# Patient Record
Sex: Male | Born: 1975 | Race: White | Hispanic: No | Marital: Single | Smoking: Never smoker
Health system: Southern US, Community
[De-identification: ages and names within clinical notes are randomized; demographics above are authoritative.]

## PROBLEM LIST (undated history)

## (undated) DIAGNOSIS — F79 Unspecified intellectual disabilities: Secondary | ICD-10-CM

## (undated) DIAGNOSIS — I341 Nonrheumatic mitral (valve) prolapse: Secondary | ICD-10-CM

## (undated) DIAGNOSIS — R569 Unspecified convulsions: Secondary | ICD-10-CM

## (undated) DIAGNOSIS — R109 Unspecified abdominal pain: Secondary | ICD-10-CM

## (undated) DIAGNOSIS — K219 Gastro-esophageal reflux disease without esophagitis: Secondary | ICD-10-CM

## (undated) DIAGNOSIS — D649 Anemia, unspecified: Secondary | ICD-10-CM

## (undated) DIAGNOSIS — F39 Unspecified mood [affective] disorder: Secondary | ICD-10-CM

## (undated) DIAGNOSIS — D696 Thrombocytopenia, unspecified: Secondary | ICD-10-CM

## (undated) HISTORY — PX: CHOLECYSTECTOMY: SHX55

---

## 2004-07-15 ENCOUNTER — Emergency Department (HOSPITAL_COMMUNITY): Admission: EM | Admit: 2004-07-15 | Discharge: 2004-07-15 | Payer: Self-pay | Admitting: Emergency Medicine

## 2008-04-01 ENCOUNTER — Emergency Department (HOSPITAL_COMMUNITY): Admission: EM | Admit: 2008-04-01 | Discharge: 2008-04-01 | Payer: Self-pay | Admitting: Emergency Medicine

## 2010-06-23 ENCOUNTER — Encounter (INDEPENDENT_AMBULATORY_CARE_PROVIDER_SITE_OTHER): Payer: Self-pay | Admitting: *Deleted

## 2010-07-13 ENCOUNTER — Ambulatory Visit: Payer: Self-pay | Admitting: Gastroenterology

## 2010-07-13 DIAGNOSIS — R112 Nausea with vomiting, unspecified: Secondary | ICD-10-CM

## 2010-07-13 DIAGNOSIS — R1013 Epigastric pain: Secondary | ICD-10-CM | POA: Insufficient documentation

## 2010-07-13 DIAGNOSIS — D649 Anemia, unspecified: Secondary | ICD-10-CM

## 2010-07-13 DIAGNOSIS — R634 Abnormal weight loss: Secondary | ICD-10-CM

## 2010-07-19 ENCOUNTER — Encounter: Payer: Self-pay | Admitting: Internal Medicine

## 2010-08-22 ENCOUNTER — Encounter (INDEPENDENT_AMBULATORY_CARE_PROVIDER_SITE_OTHER): Payer: Self-pay

## 2010-08-31 ENCOUNTER — Ambulatory Visit: Payer: Self-pay | Admitting: Internal Medicine

## 2010-12-06 NOTE — Letter (Signed)
Summary: Plan of Care, Need to Discuss  [facility]  [address]   [place], [place] [address]   Phone: [phone]  Fax: [phone]    August 22, 2010  Cole Stewart [address] [place], [place]  [address] [birth year]   Dear Cole Stewart,   We are writing this letter to inform you of treatment plans and/or discuss your plan of care.  We have tried several times to contact you; however, we have yet to reach you. We would like for you to complete the labs if not already done, and also do the stool test and get it back to [place] as soon as possible. Please call our office @ [phone] and let us know when you plan to get these done. It is very important that you do so.  Please do not neglect your health.   Sincerely,    Cole Spring LPN  [facility] Ph: [phone]    Fax: [phone]

## 2010-12-06 NOTE — Assessment & Plan Note (Signed)
Summary: ABD PAIN/SS   Visit Type:  Consult Referring Cole Stewart:  [facility] Primary Care Cole Stewart:  [facility]  Chief Complaint:  epigastric pain.  History of Present Illness: Cole Stewart is a pleasant, mentally challenged WM, who presents for further evaluation of abd pain. Patient is unable to provide any significant history given his mental disabilities. He is accompanied by caregiver/transporter. We were made aware that patient has had w/u in 2010 by Dr. [facility]. I contacted Cole Garret, FNP who referred him to [place]. She stated she intended for GI referral but did not specify [facility] for second opinion. I discussed with Dr. Jena Stewart, who saw patient back in 2002, and he determined we would offer that patient continue his care here (especially since Dr. Karilyn Stewart is not seeing any more patient in [place] and is in process of moving practice).   He presents with c/o abdominal pain. Doesn't eat lunch. C/O poor appetite. Per caregiver, he has had persistent weight loss. Otherwise, history unobtainable. No reported issues with bowels. In 4/10, weighed 155lbs, 8/11 140lbs, today 135lbs.   Labs 06/02/10: glu 91, Cre 0.65, Tbili 0.4, AP 74, AST 15, ALT 11, alb 3.7, lipase 22, amylase 30, WBC 6, H/H 10.3/32.8, MCV 67.8, plt 156,000.   CXR 06/02/10: no active cardiopulmonary disease  Records from GI associates: Last seen 4/10. EGD 4/11 was normal. B12/folate levels normal. Hgb 9.1. Ferritin normal. TIBC and sat low.   Current Medications (verified): 1)  Stavzor 500 Mg Cpdr (Valproic Acid) .... 2 Qam, 3 At Bedtime 2)  Gabapentin 400 Mg Caps (Gabapentin) .... 3 Two Times A Day 4 At Bedtime 3)  Ferrous Sulfate 325 (65 Fe) Mg Tabs (Ferrous Sulfate) .... Once Daily 4)  Carbamazepine 200 Mg Tabs (Carbamazepine) .... 2 Three Times A Day 5)  Risperdal 0.5 Mg Tabs (Risperidone) .... At Bedtime 6)  Nexium 40 Mg Cpdr (Esomeprazole Magnesium) .... Once Daily 7)  Docusate Sodium 100 Mg  Caps (Docusate Sodium) .... Two Times A Day 8)  Fanapt 10 Mg Tabs (Iloperidone) .... 2 At Bedtime 9)  Levocarnitine 330 Mg Tabs (Levocarnitine) .... Two Times A Day 10)  Vitamin D 50000units .... Q Week  Allergies (verified): No Known Drug Allergies  Past History:  Past Medical History: Mental Retardation Thalassemia??? Chronic anemia H/O seizure d/o Thryombocytopenia GERD  Past Surgical History: Cholecystectomy  Family History: Unavailable.  Social History: Mental retardation. Nonsmoker. Mother living, doesn't go home for visits.   Review of Systems       Unavailable  Vital Signs:  Patient profile:   35 year old male Weight:      135 pounds Temp:     97.9 degrees F oral Pulse rate:   80 / minute BP sitting:   120 / 88  (left arm) Cuff size:   regular  Vitals Entered By: Cole Limes Cole Stewart (July 13, 2010 11:05 AM)  Physical Exam  General:  Thin WM, NAD. Unable to provided history. Head:  Normocephalic and atraumatic. Eyes:  sclera nonicteric. Mouth:  op moist Neck:  Supple; no masses or thyromegaly. Lungs:  Clear throughout to auscultation. Heart:  Regular rate and rhythm; no murmurs, rubs,  or bruits. Abdomen:  Soft. Upper abd tenderness, mild. No HSM or masses. No abd bruit or hernia. No rebound or guarding.  Extremities:  No clubbing, cyanosis, edema or deformities noted. Neurologic:  Alert and  oriented x4;  grossly normal neurologically. Skin:  Intact without significant lesions or rashes. Cervical Nodes:  No significant cervical adenopathy. Psych:  Alert and cooperative. Normal mood and affect.  Impression & Recommendations:  Problem # 1:  EPIGASTRIC PAIN (ICD-789.06)  Reported epigastric pain, h/o intermittent n/v, anorexia, weight loss. Similar symptoms in 2010 for which he was seen by Dr. Karilyn Stewart and underwent w/u. He had normal EGD. According to their records, his symptoms resolved and he gained weight. He has not been seen at [facility]  since 4/10.   He presents now with recurrent abd pain, anorexia, documented 20 pound weight loss. He has anemia, chronic. Per PCP records, h/o Thalassemia? details unavailable. He reportedly had cholecytectomy before as well.   At this point, would recheck anemia panel, celiac screen, ifobt. If ifobt positive or evidence of IDA, then would offer TCS/EGD. Otherwise, would offer EGD only. If EGD neg, then CT A/P to complete w/u.   Orders: Consultation Level III (47829) I would like to thank [facility] for allowing [place] to take part in the care of this nice patient.   Appended Document: Orders Update PLEASE ARRANGE FOR PATIENT TO HAVE LABS DONE. ALSO NEED IFOBT DONE TOO.   Clinical Lists Changes  Orders: Added new Test order of T-Ferritin [phone]) - Signed Added new Test order of T-Iron Binding Capacity (TIBC) ([id number]) - Signed Added new Test order of T-Reticulocyte Count, Automated ([id number]) - Signed Added new Test order of T-Iron [phone]) - Signed Added new Test order of T-igA ([id number]) - Signed Added new Test order of T-Tissue Transglutamase Ab IgA ([id number]) - Signed      Appended Document: ABD PAIN/SS LMOM to call. (lab order and iFOBT at front.Marland KitchenMarland KitchenMarland KitchenMarland Kitchenplease ask nurse to explain directions of the iFOBT when you come to pick up.)  Appended Document: ABD PAIN/SS LM with Adelina Mings at the home, one of the caregivers. She will inform her boss when he returns.  Appended Document: ABD PAIN/SS Informed [employer] manager, Pascal Lux. He will try to come by Wednesday to pick up.  Appended Document: ABD PAIN/SS Please find out if he had labs, ifobt done. If not, he needs to do them. Thanks.  Appended Document: ABD PAIN/SS tried to call [facility], NA  Appended Document: ABD PAIN/SS Called, many rings and no answer. Mailing letter to call.

## 2010-12-06 NOTE — Letter (Signed)
Summary: [facility]ICAL RECORDS FROM [facility]  MEDICAL RECORDS FROM [facility]   Imported By: Rexene Alberts 07/19/2010 09:04:37  _____________________________________________________________________  External Attachment:    Type:   Image     Comment:   External Document

## 2010-12-06 NOTE — Letter (Signed)
Summary: MEDICAL RECORDS FROM [facility]  MEDICAL RECORDS FROM [facility]   Imported By: Rexene Alberts 07/19/2010 09:01:37  _____________________________________________________________________  External Attachment:    Type:   Image     Comment:   External Document

## 2010-12-06 NOTE — Assessment & Plan Note (Signed)
Summary: DROPPED OFF STOOL/SS   Pt returned one iFOBT and it was negative.     Allergies: No Known Drug Allergies  Appended Document: Orders Update    Clinical Lists Changes  Orders: Added new Service order of Immuno-chemical Fecal Occult ([id number]) - Signed

## 2010-12-06 NOTE — Letter (Signed)
Summary: Unable to Reach, Consult Scheduled  [facility]  [address]   [place], [place] [address]   Phone: [phone]  Fax: [phone]    06/23/2010  Cole Stewart [address] [place], [place]  [address] [birth year]   Dear Cole Stewart,   We have been unable to reach you by phone.  Please contact our office with an updated phone number.  At the recommendation of [facility]  we have been asked to schedule you a consult with DR FIELDS for ABDOMINAL PAIN.   Please call our office at [phone].     Thank you,    Diana Eves  [facility] R. Roetta Sessions, M.D.    Jonette Eva, M.D. Lorenza Burton, FNP-BC    Tana Coast, PA-C Phone: [phone]    Fax: [phone]

## 2011-08-02 LAB — DIFFERENTIAL
Basophils Relative: 1
Eosinophils Absolute: 0
Eosinophils Relative: 0
Lymphocytes Relative: 42
Monocytes Relative: 5
Neutrophils Relative %: 52

## 2011-08-02 LAB — BASIC METABOLIC PANEL
Chloride: 102
GFR calc Af Amer: >60
GFR calc non Af Amer: >60
Potassium: 3.5
Sodium: 138

## 2011-08-02 LAB — CBC
HCT: 36.5 — ABNORMAL LOW
Hemoglobin: 11.4 — ABNORMAL LOW
Platelets: 215
RBC: 5.51
WBC: 9.4

## 2011-08-02 LAB — RAPID URINE DRUG SCREEN, HOSP PERFORMED: Tetrahydrocannabinol: NOT DETECTED

## 2011-08-02 LAB — ETHANOL: Alcohol, Ethyl (B): <5

## 2013-03-25 ENCOUNTER — Emergency Department (HOSPITAL_COMMUNITY): Payer: Medicare Other

## 2013-03-25 ENCOUNTER — Observation Stay (HOSPITAL_COMMUNITY)
Admission: EM | Admit: 2013-03-25 | Discharge: 2013-03-26 | Disposition: A | Discharge status: Home / Self Care | Payer: Medicare Other | Attending: Internal Medicine | Admitting: Internal Medicine

## 2013-03-25 ENCOUNTER — Encounter (HOSPITAL_COMMUNITY): Payer: Self-pay | Admitting: *Deleted

## 2013-03-25 DIAGNOSIS — R1115 Cyclical vomiting syndrome unrelated to migraine: Secondary | ICD-10-CM | POA: Insufficient documentation

## 2013-03-25 DIAGNOSIS — D509 Iron deficiency anemia, unspecified: Secondary | ICD-10-CM | POA: Diagnosis present

## 2013-03-25 DIAGNOSIS — R112 Nausea with vomiting, unspecified: Secondary | ICD-10-CM

## 2013-03-25 DIAGNOSIS — D696 Thrombocytopenia, unspecified: Secondary | ICD-10-CM

## 2013-03-25 DIAGNOSIS — D649 Anemia, unspecified: Principal | ICD-10-CM

## 2013-03-25 DIAGNOSIS — R270 Ataxia, unspecified: Secondary | ICD-10-CM

## 2013-03-25 DIAGNOSIS — R279 Unspecified lack of coordination: Secondary | ICD-10-CM

## 2013-03-25 DIAGNOSIS — K5900 Constipation, unspecified: Secondary | ICD-10-CM

## 2013-03-25 DIAGNOSIS — R109 Unspecified abdominal pain: Secondary | ICD-10-CM

## 2013-03-25 DIAGNOSIS — R5383 Other fatigue: Secondary | ICD-10-CM | POA: Insufficient documentation

## 2013-03-25 DIAGNOSIS — R531 Weakness: Secondary | ICD-10-CM | POA: Diagnosis present

## 2013-03-25 DIAGNOSIS — R5381 Other malaise: Secondary | ICD-10-CM | POA: Insufficient documentation

## 2013-03-25 HISTORY — DX: Unspecified intellectual disabilities: F79

## 2013-03-25 HISTORY — DX: Anemia, unspecified: D64.9

## 2013-03-25 HISTORY — DX: Unspecified mood (affective) disorder: F39

## 2013-03-25 HISTORY — DX: Nonrheumatic mitral (valve) prolapse: I34.1

## 2013-03-25 HISTORY — DX: Gastro-esophageal reflux disease without esophagitis: K21.9

## 2013-03-25 HISTORY — DX: Unspecified convulsions: R56.9

## 2013-03-25 HISTORY — DX: Unspecified abdominal pain: R10.9

## 2013-03-25 HISTORY — DX: Thrombocytopenia, unspecified: D69.6

## 2013-03-25 LAB — COMPREHENSIVE METABOLIC PANEL
Alkaline Phosphatase: 76 U/L (ref 39–117)
BUN: 13 mg/dL (ref 6–23)
CO2: 29 mEq/L (ref 19–32)
Chloride: 98 mEq/L (ref 96–112)
GFR calc Af Amer: >90 mL/min (ref >90)
Glucose, Bld: 87 mg/dL (ref 70–99)
Potassium: 3.9 mEq/L (ref 3.5–5.1)
Total Bilirubin: 0.4 mg/dL (ref 0.3–1.2)

## 2013-03-25 LAB — CBC WITH DIFFERENTIAL/PLATELET
Basophils Relative: 0 % (ref 0–1)
Eosinophils Relative: 1 % (ref 0–5)
Hemoglobin: 11.3 g/dL — ABNORMAL LOW (ref 13.0–17.0)
Lymphs Abs: 3.4 10*3/uL (ref 0.7–4.0)
MCH: 20.8 pg — ABNORMAL LOW (ref 26.0–34.0)
MCV: 70.1 fL — ABNORMAL LOW (ref 78.0–100.0)
Monocytes Absolute: 0.5 10*3/uL (ref 0.1–1.0)
RBC: 5.42 MIL/uL (ref 4.22–5.81)

## 2013-03-25 LAB — URINALYSIS, ROUTINE W REFLEX MICROSCOPIC
Glucose, UA: NEGATIVE mg/dL
Leukocytes, UA: NEGATIVE
Protein, ur: NEGATIVE mg/dL
Specific Gravity, Urine: 1.025 (ref 1.005–1.030)
Urobilinogen, UA: 0.2 mg/dL (ref 0.0–1.0)

## 2013-03-25 LAB — RETICULOCYTES: Retic Count, Absolute: 183.9 10*3/uL (ref 19.0–186.0)

## 2013-03-25 LAB — LIPASE, BLOOD: Lipase: 20 U/L (ref 11–59)

## 2013-03-25 MED ORDER — ONDANSETRON HCL 4 MG/2ML IJ SOLN
4.0000 mg | INTRAMUSCULAR | Status: DC | PRN
  Administered 2013-03-25: 4 mg via INTRAVENOUS
  Filled 2013-03-25: qty 2

## 2013-03-25 MED ORDER — FAMOTIDINE IN NACL 20-0.9 MG/50ML-% IV SOLN
20.0000 mg | Freq: Once | INTRAVENOUS | Status: AC
  Administered 2013-03-25: 20 mg via INTRAVENOUS
  Filled 2013-03-25: qty 50

## 2013-03-25 MED ORDER — IOHEXOL 300 MG/ML  SOLN
100.0000 mL | Freq: Once | INTRAMUSCULAR | Status: AC | PRN
  Administered 2013-03-25: 100 mL via INTRAVENOUS

## 2013-03-25 MED ORDER — IOHEXOL 300 MG/ML  SOLN
50.0000 mL | Freq: Once | INTRAMUSCULAR | Status: AC | PRN
  Administered 2013-03-25: 50 mL via ORAL

## 2013-03-25 MED ORDER — SODIUM CHLORIDE 0.9 % IV SOLN
INTRAVENOUS | Status: DC
  Administered 2013-03-25: 20:00:00 via INTRAVENOUS

## 2013-03-25 NOTE — ED Provider Notes (Signed)
History     CSN: [id number]  Arrival date & time 03/25/13  1830   First MD Initiated Contact with Patient 03/25/13 1906      Chief Complaint  Patient presents with  . Abdominal Pain     Patient is a 37 y.o. male presenting with abdominal pain. The history is provided by a caregiver. The history is limited by the condition of the patient (Hx MR, speech impairment).  Abdominal Pain   Pt was seen at 1925.  Per pt's caregiver, pt c/o generalized abd "pain" and N/V that began this morning.  Pt's caregiver states he appears "off balance" and "very weak" when he was walking today. Denies focal motor weakness. Denies fevers, no diarrhea, no black or blood in stools or emesis. Denies CP/SOB, no cough.     Past Medical History  Diagnosis Date  . Mental retardation   . Seizures   . Mood disorder   . MVP (mitral valve prolapse)   . Recurrent abdominal pain   . Anemia   . Thrombocytopenia   . GERD (gastroesophageal reflux disease)     Past Surgical History  Procedure Laterality Date  . Cholecystectomy       History  Substance Use Topics  . Smoking status: Never Smoker   . Smokeless tobacco: Not on file  . Alcohol Use: No      Review of Systems  Unable to perform ROS: Patient nonverbal    Allergies  Review of patient's allergies indicates no known allergies.  Home Medications   Current Outpatient Rx  Name  Route  Sig  Dispense  Refill  . carbamazepine (TEGRETOL) 200 MG tablet   Oral   Take 400 mg by mouth 3 (three) times daily.         . chlorhexidine (PERIDEX) 0.12 % solution   Mouth/Throat   Use as directed 15 mLs in the mouth or throat 2 (two) times daily. *Swish/Spit*         . clonazePAM (KLONOPIN) 0.5 MG tablet   Oral   Take 0.5 mg by mouth 3 (three) times daily.         Marland Kitchen docusate sodium (COLACE) 100 MG capsule   Oral   Take 100 mg by mouth 2 (two) times daily.         Marland Kitchen doxepin (SINEQUAN) 10 MG capsule   Oral   Take 10 mg by mouth at  bedtime.         . gabapentin (NEURONTIN) 400 MG capsule   Oral   Take 1,200-1,600 mg by mouth 3 (three) times daily. Take 3 capsules twice daily at 8am and 12pm, then take 4 capsules at bedtime         . Iloperidone (FANAPT) 6 MG TABS   Oral   Take 2 tablets by mouth daily with supper.         . iron polysaccharides (NIFEREX) 150 MG capsule   Oral   Take 150 mg by mouth daily.         Marland Kitchen ketoconazole (NIZORAL) 2 % shampoo   Topical   Apply 1 application topically 2 (two) times a week. *Lather, leave for 5 minutes, then rinse*         . levOCARNitine (CARNITOR) 330 MG tablet   Oral   Take 330 mg by mouth 2 (two) times daily.         Marland Kitchen lurasidone (LATUDA) 40 MG TABS   Oral   Take 40 mg by mouth every  evening. *with evening meal*         . omeprazole (PRILOSEC) 40 MG capsule   Oral   Take 40 mg by mouth daily.         . Valproic Acid (STAVZOR) 250 MG CPDR   Oral   Take 1,000-1,500 mg by mouth 2 (two) times daily. Take 4 capsules in the morning and 6 capsules in the evening         . Vitamin D, Ergocalciferol, (DRISDOL) 50000 UNITS CAPS   Oral   Take 50,000 Units by mouth every 30 (thirty) days.         . vitamin E 200 UNIT capsule   Oral   Take 200 Units by mouth every morning.         . zolpidem (AMBIEN) 10 MG tablet   Oral   Take 10 mg by mouth at bedtime.           BP 139/81  Pulse 86  Temp(Src) 98.7 F (37.1 C) (Rectal)  Resp 16  Ht 5\' 6"  (1.676 m)  Wt 170 lb 6 oz (77.282 kg)  BMI 27.51 kg/m2  SpO2 100%  Physical Exam 1930: Physical examination:  Nursing notes reviewed; Vital signs and O2 SAT reviewed;  Constitutional: Well developed, Well nourished, In no acute distress; Head:  Normocephalic, atraumatic; Eyes: EOMI, PERRL, No scleral icterus; ENMT: Mouth and pharynx normal, Mucous membranes dry; Neck: Supple, Full range of motion, No lymphadenopathy; Cardiovascular: Regular rate and rhythm, No gallop; Respiratory: Breath sounds  clear & equal bilaterally, No rales, rhonchi, wheezes.  Speaking full sentences with ease, Normal respiratory effort/excursion; Chest: Nontender, Movement normal; Abdomen: Soft, +mild diffuse tenderness to palp. Nondistended, Normal bowel sounds; Genitourinary: No CVA tenderness; Extremities: Pulses normal, No tenderness, No edema, No calf edema or asymmetry.; Neuro: Awake, alert, speech impairment per baseline. No facial droop. Moves all ext spontaneously without apparent gross focal motor deficits.; Skin: Color normal, Warm, Dry.   ED Course  Procedures    MDM  MDM Reviewed: previous chart, nursing note and vitals Interpretation: labs, CT scan and x-ray   Results for orders placed during the hospital encounter of 03/25/13  URINALYSIS, ROUTINE W REFLEX MICROSCOPIC      Result Value Range   Color, Urine YELLOW  YELLOW   APPearance CLEAR  CLEAR   Specific Gravity, Urine 1.025  1.005 - 1.030   pH 6.0  5.0 - 8.0   Glucose, UA NEGATIVE  NEGATIVE mg/dL   Hgb urine dipstick NEGATIVE  NEGATIVE   Bilirubin Urine NEGATIVE  NEGATIVE   Ketones, ur NEGATIVE  NEGATIVE mg/dL   Protein, ur NEGATIVE  NEGATIVE mg/dL   Urobilinogen, UA 0.2  0.0 - 1.0 mg/dL   Nitrite NEGATIVE  NEGATIVE   Leukocytes, UA NEGATIVE  NEGATIVE  CBC WITH DIFFERENTIAL      Result Value Range   WBC 6.7  4.0 - 10.5 K/uL   RBC 5.42  4.22 - 5.81 MIL/uL   Hemoglobin 11.3 (*) 13.0 - 17.0 g/dL   HCT 16.1 (*) 09.6 - 04.5 %   MCV 70.1 (*) 78.0 - 100.0 fL   MCH 20.8 (*) 26.0 - 34.0 pg   MCHC 29.7 (*) 30.0 - 36.0 g/dL   RDW 40.9 (*) 81.1 - 91.4 %   Platelets 127 (*) 150 - 400 K/uL   Neutrophils Relative % 41 (*) 43 - 77 %   Lymphocytes Relative 50 (*) 12 - 46 %   Monocytes Relative 8  3 -  12 %   Eosinophils Relative 1  0 - 5 %   Basophils Relative 0  0 - 1 %   Neutro Abs 2.7  1.7 - 7.7 K/uL   Lymphs Abs 3.4  0.7 - 4.0 K/uL   Monocytes Absolute 0.5  0.1 - 1.0 K/uL   Eosinophils Absolute 0.1  0.0 - 0.7 K/uL   Basophils  Absolute 0.0  0.0 - 0.1 K/uL   RBC Morphology POLYCHROMASIA PRESENT     WBC Morphology ATYPICAL LYMPHOCYTES     Smear Review LARGE PLATELETS PRESENT    COMPREHENSIVE METABOLIC PANEL      Result Value Range   Sodium 135  135 - 145 mEq/L   Potassium 3.9  3.5 - 5.1 mEq/L   Chloride 98  96 - 112 mEq/L   CO2 29  19 - 32 mEq/L   Glucose, Bld 87  70 - 99 mg/dL   BUN 13  6 - 23 mg/dL   Creatinine, Ser 1.61  0.50 - 1.35 mg/dL   Calcium 8.7  8.4 - 09.6 mg/dL   Total Protein 7.0  6.0 - 8.3 g/dL   Albumin 3.4 (*) 3.5 - 5.2 g/dL   AST 27  0 - 37 U/L   ALT 40  0 - 53 U/L   Alkaline Phosphatase 76  39 - 117 U/L   Total Bilirubin 0.4  0.3 - 1.2 mg/dL   GFR calc non Af Amer >90  >90 mL/min   GFR calc Af Amer >90  >90 mL/min  LIPASE, BLOOD      Result Value Range   Lipase 20  11 - 59 U/L   Dg Chest 1 View 03/25/2013   *RADIOLOGY REPORT*  Clinical Data: Abdominal pain and chest pain.  CHEST - 1 VIEW  Comparison: None.  Findings: The heart is mildly enlarged.  There is central vascular congestion and probable mild interstitial edema.  No definite pleural effusions or focal infiltrates. Low lung volumes with vascular crowding and bibasilar atelectasis.  The bony thorax is intact.  IMPRESSION:  1.  Cardiac enlargement with vascular congestion and possible mild edema. 2.  Low lung volumes with vascular crowding and bibasilar atelectasis.   Original Report Authenticated By: Rudie Meyer, M.D.   Ct Abdomen Pelvis W Contrast 03/25/2013   *RADIOLOGY REPORT*  Clinical Data: Abdominal pain.  CT ABDOMEN AND PELVIS WITH CONTRAST  Technique:  Multidetector CT imaging of the abdomen and pelvis was performed following the standard protocol during bolus administration of intravenous contrast.  Contrast: 50mL OMNIPAQUE IOHEXOL 300 MG/ML  SOLN, OMNIPAQUE IOHEXOL 300 MG/ML  SOLN  Comparison: CT scan 03/20/2008.  Findings: The lung bases are clear.  No pleural effusion.  The liver is unremarkable.  No focal hepatic  lesions or intrahepatic biliary dilatation.  The gallbladder is surgically absent.  No common bile duct dilatation.  The pancreas is normal. The spleen is normal.  The adrenal glands and kidneys are normal.  The stomach, duodenum, small bowel and colon are unremarkable except for a moderate-to-large amount of stool throughout the colon and down into the rectum suggesting constipation.  No mesenteric or retroperitoneal masses or adenopathy.  The aorta is normal in caliber.  The major branch vessels are patent.  The appendix is normal.  The bladder, prostate gland and seminal vesicles are unremarkable. No pelvic mass, adenopathy or free pelvic fluid collections.  No inguinal mass or hernia.  The bony structures are intact.  IMPRESSION: No acute abdominal/pelvic findings, mass lesions  or adenopathy. Moderate to large amount of stool throughout the colon suggesting constipation.   Original Report Authenticated By: Rudie Meyer, M.D.   Results for BRANDON, WIECHMAN (MRN [id number]) as of 03/25/2013 21:49  Ref. Range 04/01/2008 18:00 03/25/2013 20:05  Hemoglobin Latest Range: 13.0-17.0 g/dL 40.9 (L) 81.1 (L)  HCT Latest Range: 39.0-52.0 % 36.5 (L) 38.0 (L)     2145:  Pt unable to stand for orthostatic VS.  Pt unable to walk due to generalized weakness; no apparent focal deficits. Pt with several episodes of N/V while in the ED despite IV zofran.  No stooling while in the ED.  H/H per baseline.  Dx and testing d/w pt and caregiver.  Questions answered.  Verb understanding, agreeable to observation admit.  T/C to [facility] Dr. Orvan Falconer, case discussed, including:  HPI, pertinent PM/SHx, VS/PE, dx testing, ED course and treatment:  Agreeable to observation admit, requests to write temporary orders, obtain medical bed to team 2.         Laray Anger, DO 03/27/13 1909

## 2013-03-25 NOTE — H&P (Signed)
[facility] History and Physical  Cole Stewart  ZHY:[id number]  DOB: 1976/03/20   DOA: 03/25/2013   PCP:   Kirstie Peri, MD   Chief Complaint:  A difficulty walking since today  HPI: Cole Stewart is an 37 y.o. male.   Mentally challenged young Caucasian gentleman lives in a group home in [place] is brought in because of an ataxic gait today. He takes multiple psychotropic medications, and there is a history of vomiting which started today. There is no history of fever or chills, and because of patient's And mental retardation a full review of systems is not available  Rewiew of Systems:    Past Medical History  Diagnosis Date  . Mental retardation   . Seizures   . Mood disorder   . MVP (mitral valve prolapse)   . Recurrent abdominal pain   . Anemia   . Thrombocytopenia   . GERD (gastroesophageal reflux disease)     Past Surgical History  Procedure Laterality Date  . Cholecystectomy      Medications:  HOME MEDS: Prior to Admission medications   Medication Sig Start Date End Date Taking? Authorizing Provider  carbamazepine (TEGRETOL) 200 MG tablet Take 400 mg by mouth 3 (three) times daily.   Yes Historical Provider, MD  chlorhexidine (PERIDEX) 0.12 % solution Use as directed 15 mLs in the mouth or throat 2 (two) times daily. *Swish/Spit*   Yes Historical Provider, MD  clonazePAM (KLONOPIN) 0.5 MG tablet Take 0.5 mg by mouth 3 (three) times daily.   Yes Historical Provider, MD  docusate sodium (COLACE) 100 MG capsule Take 100 mg by mouth 2 (two) times daily.   Yes Historical Provider, MD  doxepin (SINEQUAN) 10 MG capsule Take 10 mg by mouth at bedtime.   Yes Historical Provider, MD  gabapentin (NEURONTIN) 400 MG capsule Take 1,200-1,600 mg by mouth 3 (three) times daily. Take 3 capsules twice daily at 8am and 12pm, then take 4 capsules at bedtime   Yes Historical Provider, MD  Iloperidone (FANAPT) 6 MG TABS Take 2 tablets by mouth daily with supper.   Yes  Historical Provider, MD  iron polysaccharides (NIFEREX) 150 MG capsule Take 150 mg by mouth daily.   Yes Historical Provider, MD  ketoconazole (NIZORAL) 2 % shampoo Apply 1 application topically 2 (two) times a week. *Lather, leave for 5 minutes, then rinse*   Yes Historical Provider, MD  levOCARNitine (CARNITOR) 330 MG tablet Take 330 mg by mouth 2 (two) times daily.   Yes Historical Provider, MD  lurasidone (LATUDA) 40 MG TABS Take 40 mg by mouth every evening. *with evening meal*   Yes Historical Provider, MD  omeprazole (PRILOSEC) 40 MG capsule Take 40 mg by mouth daily.   Yes Historical Provider, MD  Valproic Acid (STAVZOR) 250 MG CPDR Take 1,000-1,500 mg by mouth 2 (two) times daily. Take 4 capsules in the morning and 6 capsules in the evening   Yes Historical Provider, MD  Vitamin D, Ergocalciferol, (DRISDOL) 50000 UNITS CAPS Take 50,000 Units by mouth every 30 (thirty) days.   Yes Historical Provider, MD  vitamin E 200 UNIT capsule Take 200 Units by mouth every morning.   Yes Historical Provider, MD  zolpidem (AMBIEN) 10 MG tablet Take 10 mg by mouth at bedtime.   Yes Historical Provider, MD     Allergies:  Allergies no known allergies  Social History:   reports that he has never smoked. He does not have any smokeless tobacco history on file. He  reports that he does not drink alcohol or use illicit drugs.  Family History: History reviewed. No pertinent family history. Unable to attain  Physical Exam: Filed Vitals:   03/25/13 1939 03/25/13 1940 03/25/13 2012 03/25/13 2121  BP: 144/96 139/81  138/79  Pulse: 82 86  71  Temp:   98.7 F (37.1 C)   TempSrc:   Rectal   Resp:    18  Height:      Weight:      SpO2:    99%   Blood pressure 138/79, pulse 71, temperature 98.7 F (37.1 C), temperature source Rectal, resp. rate 18, height 5\' 6"  (1.676 m), weight 77.282 kg (170 lb 6 oz), SpO2 99.00%.  GEN:  Pleasant young Caucasian gentleman lying bed in no acute distress; unable to  be cooperative with exam PSYCH:  alert ;  neither anxious or depressed; affect is appropriate. HEENT: Mucous membranes pink, dry and anicteric; PERRLA, vertical nystagmus noted; does not cooperate with eye movement; no cervical lymphadenopathy nor thyromegaly or carotid bruit; no JVD; Breasts:: Not examined CHEST WALL: No tenderness CHEST: Normal respiration, clear to auscultation bilaterally HEART: Regular rate and rhythm; no murmurs rubs or gallops BACK: ; no CVA tenderness ABDOMEN: Obese, soft non-tender; no masses, no organomegaly, normal abdominal bowel sounds; no pannus; no intertriginous candida. Rectal Exam: Not done EXTREMITIES: N; no edema; no ulcerations. Genitalia: not examined PULSES: 2+ and symmetric SKIN: Normal hydration no rash or ulceration CNS: The patient does not fully cooperate with the exam; he has vertical nystagmus; unclear if this is new or old ;there is a faint suggestion of right facial weakness; he has fairly good grip strength in both upper extremities; he moves the left lower extremity on command, but seems to have more difficulty with her right lower extremity; but again his cooperation is not maximal. Gait was not tested   Labs on Admission:  Basic Metabolic Panel:  Recent Labs Lab 03/25/13 2005  NA 135  K 3.9  CL 98  CO2 29  GLUCOSE 87  BUN 13  CREATININE 0.57  CALCIUM 8.7   Liver Function Tests:  Recent Labs Lab 03/25/13 2005  AST 27  ALT 40  ALKPHOS 76  BILITOT 0.4  PROT 7.0  ALBUMIN 3.4*    Recent Labs Lab 03/25/13 2005  LIPASE 20   No results found for this basename: AMMONIA,  in the last 168 hours CBC:  Recent Labs Lab 03/25/13 2005  WBC 6.7  NEUTROABS 2.7  HGB 11.3*  HCT 38.0*  MCV 70.1*  PLT 127*   Cardiac Enzymes: No results found for this basename: CKTOTAL, CKMB, CKMBINDEX, TROPONINI,  in the last 168 hours BNP: No components found with this basename: POCBNP,  D-dimer: No components found with this basename:  D-DIMER,  CBG: No results found for this basename: GLUCAP,  in the last 168 hours  Radiological Exams on Admission: Dg Chest 1 View  03/25/2013   *RADIOLOGY REPORT*  Clinical Data: Abdominal pain and chest pain.  CHEST - 1 VIEW  Comparison: None.  Findings: The heart is mildly enlarged.  There is central vascular congestion and probable mild interstitial edema.  No definite pleural effusions or focal infiltrates. Low lung volumes with vascular crowding and bibasilar atelectasis.  The bony thorax is intact.  IMPRESSION:  1.  Cardiac enlargement with vascular congestion and possible mild edema. 2.  Low lung volumes with vascular crowding and bibasilar atelectasis.   Original Report Authenticated By: Rudie Meyer, M.D.  Ct Abdomen Pelvis W Contrast  03/25/2013   *RADIOLOGY REPORT*  Clinical Data: Abdominal pain.  CT ABDOMEN AND PELVIS WITH CONTRAST  Technique:  Multidetector CT imaging of the abdomen and pelvis was performed following the standard protocol during bolus administration of intravenous contrast.  Contrast: 50mL OMNIPAQUE IOHEXOL 300 MG/ML  SOLN, OMNIPAQUE IOHEXOL 300 MG/ML  SOLN  Comparison: CT scan 03/20/2008.  Findings: The lung bases are clear.  No pleural effusion.  The liver is unremarkable.  No focal hepatic lesions or intrahepatic biliary dilatation.  The gallbladder is surgically absent.  No common bile duct dilatation.  The pancreas is normal. The spleen is normal.  The adrenal glands and kidneys are normal.  The stomach, duodenum, small bowel and colon are unremarkable except for a moderate-to-large amount of stool throughout the colon and down into the rectum suggesting constipation.  No mesenteric or retroperitoneal masses or adenopathy.  The aorta is normal in caliber.  The major branch vessels are patent.  The appendix is normal.  The bladder, prostate gland and seminal vesicles are unremarkable. No pelvic mass, adenopathy or free pelvic fluid collections.  No inguinal mass or  hernia.  The bony structures are intact.  IMPRESSION: No acute abdominal/pelvic findings, mass lesions or adenopathy. Moderate to large amount of stool throughout the colon suggesting constipation.   Original Report Authenticated By: Rudie Meyer, M.D.       Assessment/Plan  Active Problems:   ANEMIA   Intractable nausea and vomiting   Generalized weakness   Constipation   Ataxia   Thrombocytopenia, unspecified   PLAN: Mentally retarded gentleman brought in for ataxic gait and noted to be vomiting, and did not very clearly defined neurological abnormalities. Also noted to have a microcytic anemia  Differential includes weakness due to dehydration, toxic effects of his multiple psychotropic medications in the setting of dehydration, cerebellar or brainstem ischemia.  Will admit him to a MedSurg bed for hydration; check his valproic acid level; temporarily hold his Neurontin which he is  Receiving at very high doses; temporarily hold antipsychotic medication; get CT scan of the brain, and consult neurology service for assistance with management.  We note that he is taking iron tablets, but we'll get an anemia panel nevertheless.  Other plans as per orders.  Code Status: Full Family Communication: No family members or caregivers present for discussion Disposition Plan: Likely discharge back to group home when stable    Raydan Schlabach Nocturnist [facility] Pager [phone]   03/25/2013, 10:14 PM

## 2013-03-25 NOTE — ED Notes (Signed)
Pt is unable to walk on his own at this time; caretaker states that this is not normal for him, at home he walks without assistance

## 2013-03-25 NOTE — ED Notes (Signed)
abd pain, n/d Pt is nonverbal.  "balance is off" per care giver.  Pt is from a group home.

## 2013-03-26 ENCOUNTER — Observation Stay (HOSPITAL_COMMUNITY): Payer: Medicare Other

## 2013-03-26 ENCOUNTER — Encounter (HOSPITAL_COMMUNITY): Payer: Self-pay

## 2013-03-26 DIAGNOSIS — R270 Ataxia, unspecified: Secondary | ICD-10-CM | POA: Diagnosis present

## 2013-03-26 DIAGNOSIS — K5900 Constipation, unspecified: Secondary | ICD-10-CM | POA: Diagnosis present

## 2013-03-26 DIAGNOSIS — D696 Thrombocytopenia, unspecified: Secondary | ICD-10-CM | POA: Diagnosis present

## 2013-03-26 DIAGNOSIS — R109 Unspecified abdominal pain: Secondary | ICD-10-CM

## 2013-03-26 DIAGNOSIS — R531 Weakness: Secondary | ICD-10-CM | POA: Diagnosis present

## 2013-03-26 DIAGNOSIS — R112 Nausea with vomiting, unspecified: Secondary | ICD-10-CM | POA: Diagnosis present

## 2013-03-26 DIAGNOSIS — R5383 Other fatigue: Secondary | ICD-10-CM

## 2013-03-26 LAB — CBC
HCT: 37.9 % — ABNORMAL LOW (ref 39.0–52.0)
MCHC: 29 g/dL — ABNORMAL LOW (ref 30.0–36.0)
Platelets: 125 10*3/uL — ABNORMAL LOW (ref 150–400)
RDW: 20.3 % — ABNORMAL HIGH (ref 11.5–15.5)

## 2013-03-26 LAB — COMPREHENSIVE METABOLIC PANEL
AST: 25 U/L (ref 0–37)
Albumin: 3 g/dL — ABNORMAL LOW (ref 3.5–5.2)
Alkaline Phosphatase: 70 U/L (ref 39–117)
BUN: 7 mg/dL (ref 6–23)
Potassium: 4.1 mEq/L (ref 3.5–5.1)
Sodium: 137 mEq/L (ref 135–145)
Total Protein: 6.2 g/dL (ref 6.0–8.3)

## 2013-03-26 LAB — FOLATE: Folate: >20 ng/mL

## 2013-03-26 LAB — TSH: TSH: 7.026 u[IU]/mL — ABNORMAL HIGH (ref 0.350–4.500)

## 2013-03-26 LAB — MRSA PCR SCREENING: MRSA by PCR: NEGATIVE

## 2013-03-26 LAB — HEMOGLOBIN A1C
Hgb A1c MFr Bld: 4.7 % (ref <5.7)
Mean Plasma Glucose: 88 mg/dL (ref <117)

## 2013-03-26 LAB — IRON AND TIBC: Iron: 116 ug/dL (ref 42–135)

## 2013-03-26 LAB — VALPROIC ACID LEVEL: Valproic Acid Lvl: 60 ug/mL (ref 50.0–100.0)

## 2013-03-26 MED ORDER — PANTOPRAZOLE SODIUM 40 MG PO TBEC
80.0000 mg | DELAYED_RELEASE_TABLET | Freq: Every day | ORAL | Status: DC
  Administered 2013-03-26: 80 mg via ORAL
  Filled 2013-03-26: qty 2

## 2013-03-26 MED ORDER — FLEET ENEMA 7-19 GM/118ML RE ENEM: 1.0000 | ENEMA | Freq: Every day | RECTAL | Status: DC | PRN

## 2013-03-26 MED ORDER — TRAZODONE HCL 50 MG PO TABS: 25.0000 mg | ORAL_TABLET | Freq: Every evening | ORAL | Status: DC | PRN

## 2013-03-26 MED ORDER — ACETAMINOPHEN 325 MG PO TABS
650.0000 mg | ORAL_TABLET | Freq: Four times a day (QID) | ORAL | Status: DC | PRN
  Administered 2013-03-26: 650 mg via ORAL
  Filled 2013-03-26: qty 2

## 2013-03-26 MED ORDER — VALPROIC ACID 250 MG PO CAPS
1000.0000 mg | ORAL_CAPSULE | ORAL | Status: DC
  Administered 2013-03-26: 1000 mg via ORAL
  Filled 2013-03-26 (×3): qty 4

## 2013-03-26 MED ORDER — POLYETHYLENE GLYCOL 3350 17 G PO PACK: 17.0000 g | PACK | Freq: Every day | ORAL | Status: DC

## 2013-03-26 MED ORDER — ONDANSETRON HCL 4 MG PO TABS: 4.0000 mg | ORAL_TABLET | Freq: Three times a day (TID) | ORAL | Status: DC | PRN

## 2013-03-26 MED ORDER — ONDANSETRON HCL 4 MG/2ML IJ SOLN
4.0000 mg | INTRAMUSCULAR | Status: DC | PRN
  Administered 2013-03-26: 4 mg via INTRAVENOUS
  Filled 2013-03-26: qty 2

## 2013-03-26 MED ORDER — POTASSIUM CHLORIDE IN NACL 20-0.9 MEQ/L-% IV SOLN
INTRAVENOUS | Status: DC
  Administered 2013-03-26: 02:00:00 via INTRAVENOUS

## 2013-03-26 MED ORDER — VALPROIC ACID 250 MG PO CAPS
1500.0000 mg | ORAL_CAPSULE | Freq: Every day | ORAL | Status: DC
  Administered 2013-03-26: 1500 mg via ORAL
  Filled 2013-03-26 (×3): qty 6

## 2013-03-26 MED ORDER — ASPIRIN EC 81 MG PO TBEC
81.0000 mg | DELAYED_RELEASE_TABLET | Freq: Every day | ORAL | Status: DC
  Administered 2013-03-26: 81 mg via ORAL
  Filled 2013-03-26: qty 1

## 2013-03-26 MED ORDER — CHLORHEXIDINE GLUCONATE 0.12 % MT SOLN
15.0000 mL | Freq: Two times a day (BID) | OROMUCOSAL | Status: DC
  Administered 2013-03-26 (×2): 15 mL via OROMUCOSAL
  Filled 2013-03-26 (×2): qty 15

## 2013-03-26 MED ORDER — ACETAMINOPHEN 650 MG RE SUPP: 650.0000 mg | Freq: Four times a day (QID) | RECTAL | Status: DC | PRN

## 2013-03-26 MED ORDER — SODIUM CHLORIDE 0.9 % IJ SOLN: 3.0000 mL | Freq: Two times a day (BID) | INTRAMUSCULAR | Status: DC

## 2013-03-26 MED ORDER — VALPROIC ACID 250 MG PO CPDR: 1000.0000 mg | DELAYED_RELEASE_CAPSULE | Freq: Two times a day (BID) | ORAL | Status: DC

## 2013-03-26 MED ORDER — ONDANSETRON HCL 4 MG/2ML IJ SOLN: 4.0000 mg | Freq: Three times a day (TID) | INTRAMUSCULAR | Status: DC | PRN

## 2013-03-26 MED ORDER — CARBAMAZEPINE 200 MG PO TABS
400.0000 mg | ORAL_TABLET | Freq: Three times a day (TID) | ORAL | Status: DC
  Administered 2013-03-26: 400 mg via ORAL
  Filled 2013-03-26 (×7): qty 2

## 2013-03-26 MED ORDER — SODIUM CHLORIDE 0.9 % IV SOLN: INTRAVENOUS | Status: DC

## 2013-03-26 NOTE — Progress Notes (Signed)
UR Chart Review Completed  

## 2013-03-26 NOTE — Care Management Note (Unsigned)
    Page 1 of 1   03/26/2013     1:52:36 PM   CARE MANAGEMENT NOTE 03/26/2013  Patient:  KELSEN, CELONA   Account Number:  [id number]  Date Initiated:  03/26/2013  Documentation initiated by:  Anibal Henderson  Subjective/Objective Assessment:   Admitted with ataxis, dehydration. At baseline, pt is MR, and does not speak much. Caregive/ family memberr present in room at 1000- referred to CSW, Santa Genera and she has come to speak with him.  Pt is from a group home     Action/Plan:   Pt to return to group home today. CSW will assist with this transfer.   Anticipated DC Date:  03/26/2013   Anticipated DC Plan:  GROUP HOME  In-house referral  Clinical Social Worker      DC Planning Services  CM consult      Choice offered to / List presented to:             Status of service:  Completed, signed off Medicare Important Message given?  NA - LOS <3 / Initial given by admissions (If response is "NO", the following Medicare IM given date fields will be blank) Date Medicare IM given:   Date Additional Medicare IM given:    Discharge Disposition:  HOME/SELF CARE  Per UR Regulation:  Reviewed for med. necessity/level of care/duration of stay  If discussed at Long Length of Stay Meetings, dates discussed:    Comments:  03/26/13/ 1000 Anibal Henderson RN

## 2013-03-26 NOTE — Clinical Social Work Psychosocial (Signed)
    Clinical Social Work Department BRIEF PSYCHOSOCIAL ASSESSMENT 03/26/2013  Patient:  Cole Stewart, Cole Stewart     Account Number:  [id number]     Admit date:  03/25/2013  Clinical Social Worker:  Santa Genera, CLINICAL SOCIAL WORKER  Date/Time:  03/26/2013 10:00 AM  Referred by:  Physician  Date Referred:  03/25/2013 Referred for  ALF Placement   Other Referral:   Interview type:  Other - See comment Other interview type:   Spoke w facility staff    PSYCHOSOCIAL DATA Living Status:  FACILITY Admitted from facility:  OTHER Level of care:  Assisted Living Primary support name:  Cole Stewart Primary support relationship to patient:  NONE Degree of support available:   Placed at [facility], primary supports are group home/ALF staff.  Mother, Cole Stewart, is patient's guardian    CURRENT CONCERNS Current Concerns  Post-Acute Placement   Other Concerns:    SOCIAL WORK ASSESSMENT / PLAN CSW met w patient at bedside, patient has severe mental retardation and did not participate in assessment in any significant manner.  Asked that CSW speak w group home staff about his situation.  Per group home staff, Cole Stewart, present in room w patient, patient has been at [facility] for 2 - 3 years and was placed w another group home prior to that time.  His mother is his legal guardian and sees him occasionally but is not significantly involved in his daily life.    Patient placed in group home due to diagnoses of severe mental retardation and intermittent explosive disorder. [facility] takes their patients to psychiatrist in [facility] [address] [facility] [phone]) for psychiatric medications management.  Per facility RN Cole Stewart [phone]), patient is normally pleasant and cooperative; however he can become significantly angry at homes.  Staff says "he will fight you in a minute" "he is very strong." Psychiatrist in [place] sees patient every 3 months and is prescribing medications to alleviate this  condition.  Per facility, patient is active in the community and volunteers at [employer], the [employer] and a local retirement home. He requires assistance w bathing, dressing and toileting. He can feed himself and walk/transfer without assistance.    Patient has a care coordinator from [facility] - Cole Stewart - who monitors his care at facility.    Facility willing to take patient back at discharge, would like information on discharge needs.  CSW reminded facility about need for PASARR for patient; facility staff aware of situation and will address as appropriate.   Assessment/plan status:  Psychosocial Support/Ongoing Assessment of Needs Other assessment/ plan:   Information/referral to community resources:   None needed at this time.    PATIENT'S/FAMILY'S RESPONSE TO PLAN OF CARE: Facility appreciative of updates on patient and information on discharge planning.  Facility willing to transport patient at discharge.  CSW left VM for mother, Cole Stewart, at number listed on facesheet.  Per facility staff, she is guardian but not significantly involved in patient's daily life.    Santa Genera, LCSW Clinical Social Worker [phone])

## 2013-03-26 NOTE — Progress Notes (Signed)
DISCHARGE INSTRUCTIONS GIVEN TO CARE GIVER MARK PETTIE. IV D/C'D. PT ACKNOWLEGES RELEIF OF ABDOMINAL PAIN AND NAUSEA.  DISCHARGED TO GROUP HOME

## 2013-03-26 NOTE — Clinical Social Work Note (Signed)
Patient ready for discharge today and return to [facility].  Facility staff, Christella Scheuermann, informed and agreeable to discharge. Facility will provide transport.  No FL2 required as patient was admitted for less than 24 hours.  Facility provided w copy of discharge summary, AVS and scripts for new medications.  CSW signing off as no further SW needs are identified.  Santa Genera, LCSW Clinical Social Worker [phone])

## 2013-03-26 NOTE — Progress Notes (Signed)
PT AMBULATED IN HALL ACCOMPAINED BY NURSE AND PT'S CAREGIVER FROM GROUP HOME. TOLERATED WEEL. NO STUMBLING. [facility] OBSERVED ALSO

## 2013-03-26 NOTE — Discharge Summary (Signed)
Physician Discharge Summary  Cole Stewart GEX:[id number] DOB: [birth year] DOA: 03/25/2013  PCP: Kirstie Peri, MD  Admit date: 03/25/2013 Discharge date: 03/26/2013  Time spent: 45 minutes  Recommendations for Outpatient Follow-up:  1. Patient will be discharged back to his group home. He can follow up with his primary care physician 1-2 weeks.  Discharge Diagnoses:  Active Problems:   ANEMIA   Intractable nausea and vomiting   Generalized weakness   Constipation   Ataxia   Thrombocytopenia, unspecified   Discharge Condition: Improved  Diet recommendation: Low salt  Filed Weights   03/25/13 1858 03/25/13 2329  Weight: 77.282 kg (170 lb 6 oz) 79.7 kg (175 lb 11.3 oz)    History of present illness:  Cole Stewart is an 37 y.o. male. Mentally challenged young Caucasian gentleman lives in a group home in [place] is brought in because of an ataxic gait today. He takes multiple psychotropic medications, and there is a history of vomiting which started today.  There is no history of fever or chills, and because of patient's And mental retardation a full review of systems is not available  Hospital Course:  This gentleman was admitted to the hospital with vomiting, abdominal pain, difficulty with his gait. Patient was having significant vomiting underwent CT scan of the abdomen and pelvis. This revealed moderate constipation without any other acute abnormalities. He was given enemas and had good effect. He is moving his bowels well at this point. His symptoms have significantly improved abdominal pain is also improved. Patient also was unable to stand on admission. Speaking to his caregiver, it was felt that he was generally weak and did not have any focal neurologic deficits. CT scan of the head done on admission did not show any acute findings. Patient was likely dehydrated which was causing his generalized weakness. He has been rehydrated and his weakness has resolved. He is currently  ambulating independently on the unit. He does not have any signs of focal deficits. His caregiver feels that he is back to his functional baseline. His gait is also at his baseline. Since the patient's acute issues have resolved, he'll be discharged back to his group home today. He will be given prescription for MiraLAX and when necessary Fleet enema. We will also give Zofran when necessary for nausea.  Procedures:  none  Consultations:  none  Discharge Exam: Filed Vitals:   03/25/13 2329 03/26/13 0000 03/26/13 0100 03/26/13 0800  BP:  109/77  132/83  Pulse:  73 84 77  Temp: 97.9 F (36.6 C)   97.7 F (36.5 C)  TempSrc: Oral   Oral  Resp:  17 15 18   Height: 5\' 6"  (1.676 m)     Weight: 79.7 kg (175 lb 11.3 oz)     SpO2:  99% 100% 99%    General: No signs of acute distress, patient is sitting on commode Cardiovascular:  S1, S2, regular rate and rhythm  Respiratory:  clear to auscultation bilaterally  Discharge Instructions  Discharge Orders   Future Orders Complete By Expires     Call MD for:  extreme fatigue  As directed     Call MD for:  persistant dizziness or light-headedness  As directed     Call MD for:  persistant nausea and vomiting  As directed     Call MD for:  temperature >100.4  As directed     Diet - low sodium heart healthy  As directed     Increase activity slowly  As  directed         Medication List    TAKE these medications       carbamazepine 200 MG tablet  Commonly known as:  TEGRETOL  Take 400 mg by mouth 3 (three) times daily.     chlorhexidine 0.12 % solution  Commonly known as:  PERIDEX  Use as directed 15 mLs in the mouth or throat 2 (two) times daily. *Swish/Spit*     clonazePAM 0.5 MG tablet  Commonly known as:  KLONOPIN  Take 0.5 mg by mouth 3 (three) times daily.     docusate sodium 100 MG capsule  Commonly known as:  COLACE  Take 100 mg by mouth 2 (two) times daily.     doxepin 10 MG capsule  Commonly known as:  SINEQUAN  Take  10 mg by mouth at bedtime.     FANAPT 6 MG Tabs  Generic drug:  Iloperidone  Take 2 tablets by mouth daily with supper.     gabapentin 400 MG capsule  Commonly known as:  NEURONTIN  Take 1,200-1,600 mg by mouth 3 (three) times daily. Take 3 capsules twice daily at 8am and 12pm, then take 4 capsules at bedtime     iron polysaccharides 150 MG capsule  Commonly known as:  NIFEREX  Take 150 mg by mouth daily.     ketoconazole 2 % shampoo  Commonly known as:  NIZORAL  Apply 1 application topically 2 (two) times a week. *Lather, leave for 5 minutes, then rinse*     levOCARNitine 330 MG tablet  Commonly known as:  CARNITOR  Take 330 mg by mouth 2 (two) times daily.     lurasidone 40 MG Tabs  Commonly known as:  LATUDA  Take 40 mg by mouth every evening. *with evening meal*     omeprazole 40 MG capsule  Commonly known as:  PRILOSEC  Take 40 mg by mouth daily.     ondansetron 4 MG tablet  Commonly known as:  ZOFRAN  Take 1 tablet (4 mg total) by mouth every 8 (eight) hours as needed for nausea.     polyethylene glycol packet  Commonly known as:  MIRALAX  Take 17 g by mouth daily.     sodium phosphate 7-19 GM/118ML Enem  Place 1 enema rectally daily as needed (if no bowel movement in 2 days).     STAVZOR 250 MG Cpdr  Generic drug:  Valproic Acid  Take 1,000-1,500 mg by mouth 2 (two) times daily. Take 4 capsules in the morning and 6 capsules in the evening     Vitamin D (Ergocalciferol) 50000 UNITS Caps  Commonly known as:  DRISDOL  Take 50,000 Units by mouth every 30 (thirty) days.     vitamin E 200 UNIT capsule  Take 200 Units by mouth every morning.     zolpidem 10 MG tablet  Commonly known as:  AMBIEN  Take 10 mg by mouth at bedtime.       No Known Allergies     Follow-up Information   Follow up with [facility], [place]. Schedule an appointment as soon as possible for a visit in 1 week.   Contact information:   [address]  [place] [address] [phone]         The results of significant diagnostics from this hospitalization (including imaging, microbiology, ancillary and laboratory) are listed below for reference.    Significant Diagnostic Studies: Dg Chest 1 View  03/25/2013   *RADIOLOGY REPORT*  Clinical Data:  Abdominal pain and chest pain.  CHEST - 1 VIEW  Comparison: None.  Findings: The heart is mildly enlarged.  There is central vascular congestion and probable mild interstitial edema.  No definite pleural effusions or focal infiltrates. Low lung volumes with vascular crowding and bibasilar atelectasis.  The bony thorax is intact.  IMPRESSION:  1.  Cardiac enlargement with vascular congestion and possible mild edema. 2.  Low lung volumes with vascular crowding and bibasilar atelectasis.   Original Report Authenticated By: Rudie Meyer, M.D.   Ct Head Wo Contrast  03/26/2013   *RADIOLOGY REPORT*  Clinical Data: Ataxia.  Vomiting.  Seizures.  CT HEAD WITHOUT CONTRAST  Technique:  Contiguous axial images were obtained from the base of the skull through the vertex without contrast.  Comparison: None.  Findings: No intracranial hemorrhage.  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  Result of prior trauma not excluded.  No CT evidence of large acute infarct.  Mild global atrophy without hydrocephalus.  No intracranial mass lesion detected on this unenhanced exam.  Mastoid air cells, middle ear cavities and visualized paranasal sinuses are clear.  Visualized orbital structures unremarkable.  IMPRESSION: No intracranial hemorrhage.  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  Result of prior trauma not excluded.  No CT evidence of large acute infarct.  Mild global atrophy without hydrocephalus.   Original Report Authenticated By: Lacy Duverney, M.D.   Ct Abdomen Pelvis W Contrast  03/25/2013   *RADIOLOGY REPORT*  Clinical Data: Abdominal pain.  CT ABDOMEN AND PELVIS WITH CONTRAST  Technique:   Multidetector CT imaging of the abdomen and pelvis was performed following the standard protocol during bolus administration of intravenous contrast.  Contrast: 50mL OMNIPAQUE IOHEXOL 300 MG/ML  SOLN, OMNIPAQUE IOHEXOL 300 MG/ML  SOLN  Comparison: CT scan 03/20/2008.  Findings: The lung bases are clear.  No pleural effusion.  The liver is unremarkable.  No focal hepatic lesions or intrahepatic biliary dilatation.  The gallbladder is surgically absent.  No common bile duct dilatation.  The pancreas is normal. The spleen is normal.  The adrenal glands and kidneys are normal.  The stomach, duodenum, small bowel and colon are unremarkable except for a moderate-to-large amount of stool throughout the colon and down into the rectum suggesting constipation.  No mesenteric or retroperitoneal masses or adenopathy.  The aorta is normal in caliber.  The major branch vessels are patent.  The appendix is normal.  The bladder, prostate gland and seminal vesicles are unremarkable. No pelvic mass, adenopathy or free pelvic fluid collections.  No inguinal mass or hernia.  The bony structures are intact.  IMPRESSION: No acute abdominal/pelvic findings, mass lesions or adenopathy. Moderate to large amount of stool throughout the colon suggesting constipation.   Original Report Authenticated By: Rudie Meyer, M.D.    Microbiology: Recent Results (from the past 240 hour(s))  MRSA PCR SCREENING     Status: None   Collection Time    03/25/13 11:29 PM      Result Value Range Status   MRSA by PCR NEGATIVE  NEGATIVE Final   Comment:            The GeneXpert MRSA Assay (FDA     approved for NASAL specimens     only), is one component of a     comprehensive MRSA colonization     surveillance program. It is not     intended to diagnose MRSA     infection nor to  guide or     monitor treatment for     MRSA infections.     Labs: Basic Metabolic Panel:  Recent Labs Lab 03/25/13 2005 03/26/13 0525  NA 135 137  K  3.9 4.1  CL 98 103  CO2 29 24  GLUCOSE 87 82  BUN 13 7  CREATININE 0.57 0.55  CALCIUM 8.7 8.0*   Liver Function Tests:  Recent Labs Lab 03/25/13 2005 03/26/13 0525  AST 27 25  ALT 40 32  ALKPHOS 76 70  BILITOT 0.4 0.4  PROT 7.0 6.2  ALBUMIN 3.4* 3.0*    Recent Labs Lab 03/25/13 2005  LIPASE 20   No results found for this basename: AMMONIA,  in the last 168 hours CBC:  Recent Labs Lab 03/25/13 2005 03/26/13 0525  WBC 6.7 6.7  NEUTROABS 2.7  --   HGB 11.3* 11.0*  HCT 38.0* 37.9*  MCV 70.1* 70.8*  PLT 127* 125*   Cardiac Enzymes: No results found for this basename: CKTOTAL, CKMB, CKMBINDEX, TROPONINI,  in the last 168 hours BNP: BNP (last 3 results) No results found for this basename: PROBNP,  in the last 8760 hours CBG: No results found for this basename: GLUCAP,  in the last 168 hours     Signed:  Nicholson Starace  [facility] 03/26/2013, 11:02 AM

## 2013-03-27 LAB — URINE CULTURE: Colony Count: NO GROWTH

## 2014-12-10 IMAGING — CT CT HEAD W/O CM
1 series · 16 of 30 positions shown, 20 images · non-contrast
Comparison: None.

CLINICAL DATA: Ataxia.  Vomiting.  Seizures.

CT HEAD WITHOUT CONTRAST
TECHNIQUE: Contiguous axial images were obtained from the base of
the skull through the vertex without contrast.

[Series 2: headseq 4.8 h37s · axial · 0.43mm/px · z∈[+67,+237]mm · 16 of 36 slices shown, 20 images]
[im 2/36  brain]
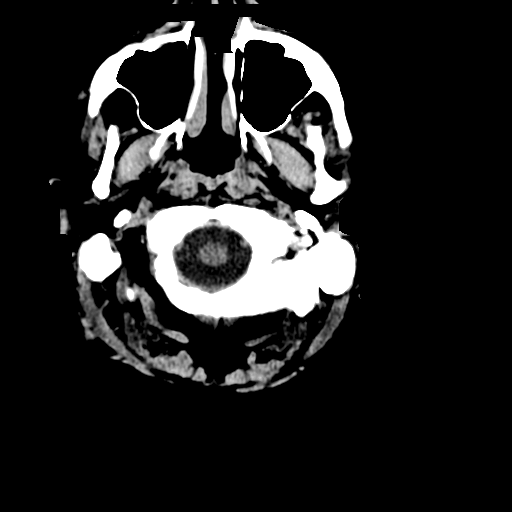
[im 2/36  bone]
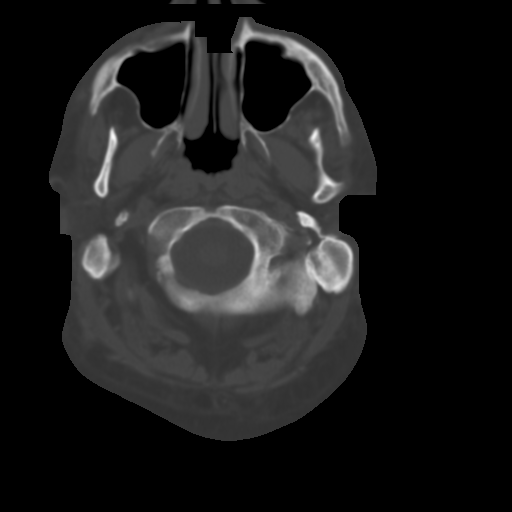
[im 4/36  brain]
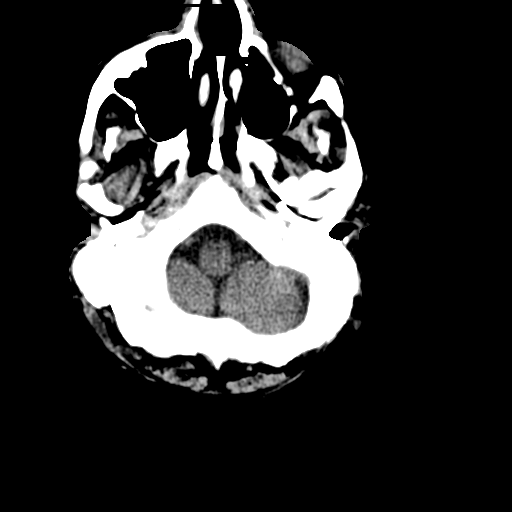
[im 7/36  brain]
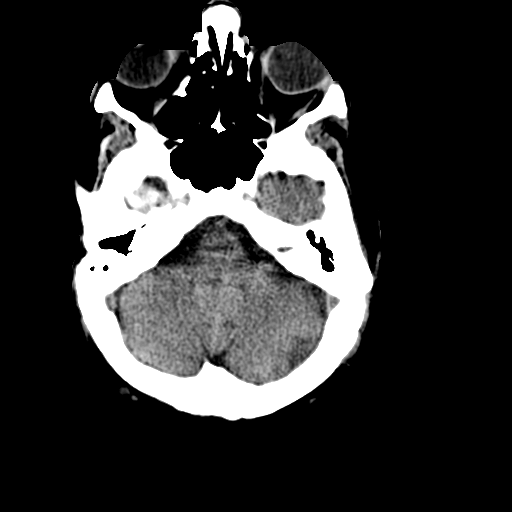
[im 9/36  brain]
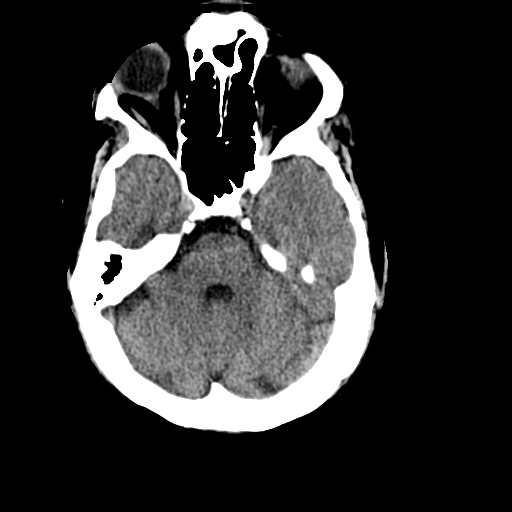
[im 10/36  brain]
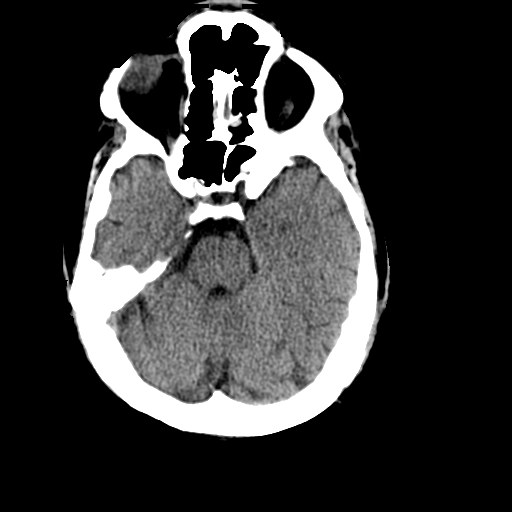
[im 10/36  bone]
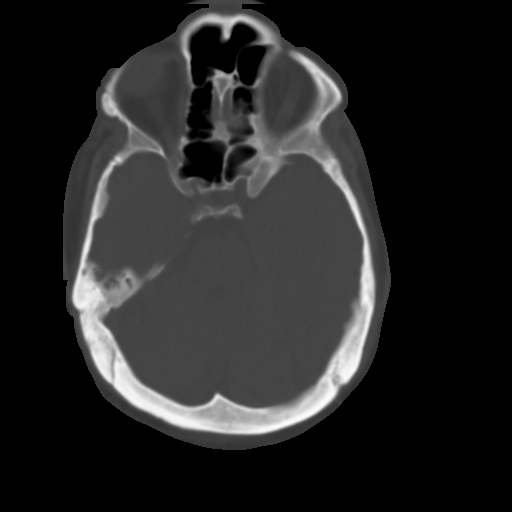
[im 13/36  brain]
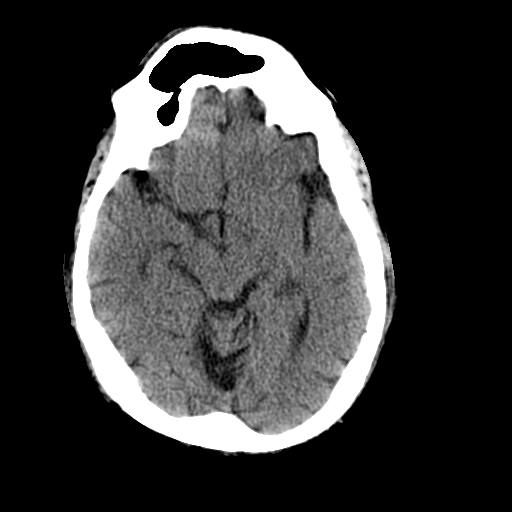
[im 15/36  brain]
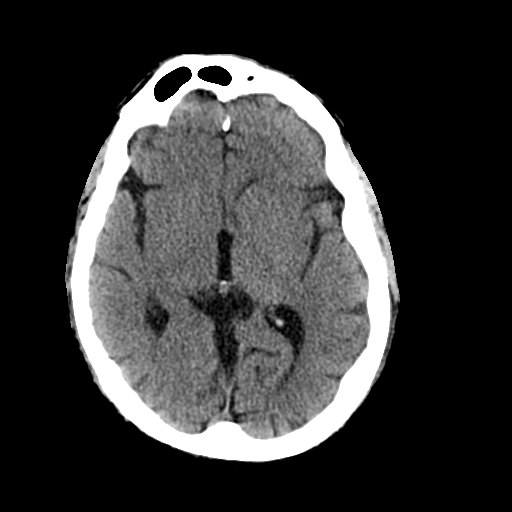
[im 17/36  brain]
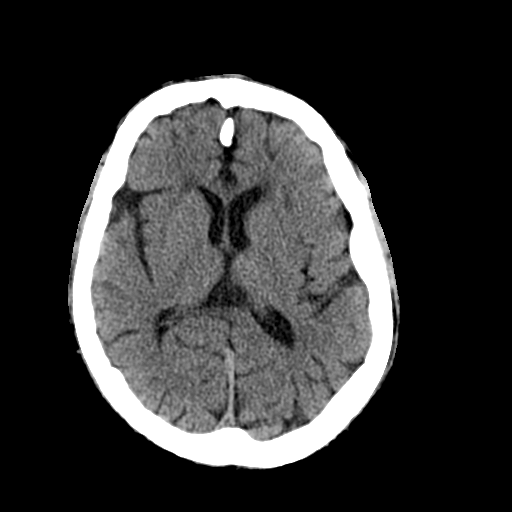
[im 19/36  brain]
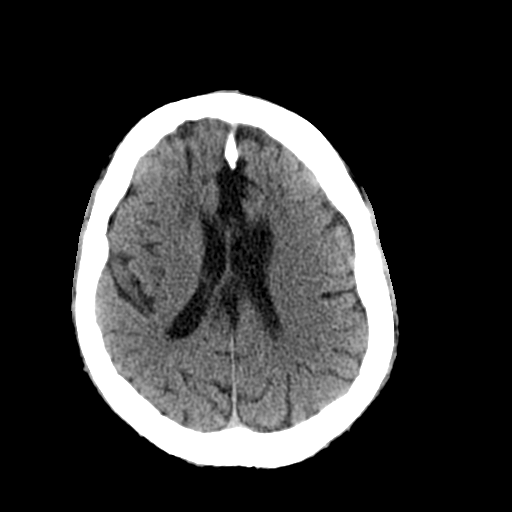
[im 19/36  bone]
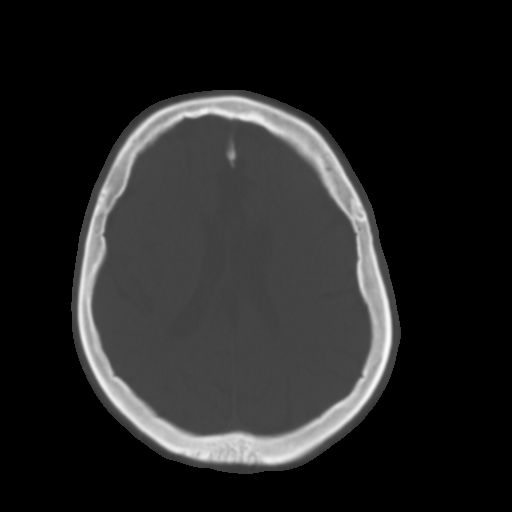
[im 21/36  brain]
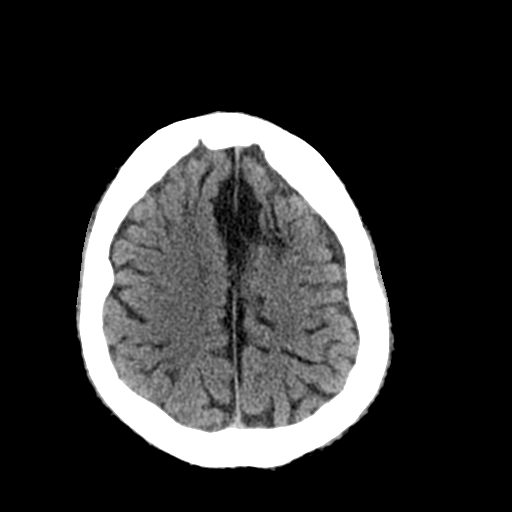
[im 23/36  brain]
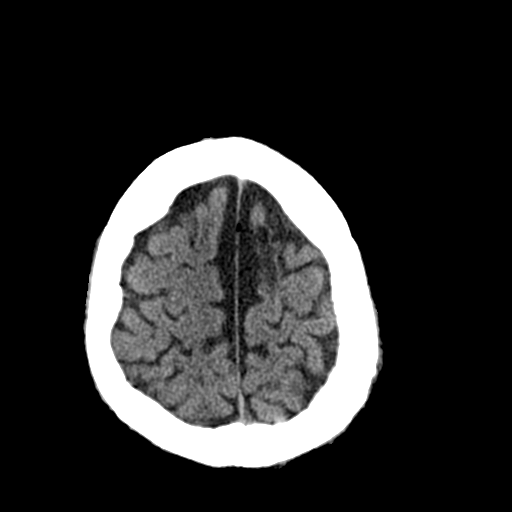
[im 26/36  brain]
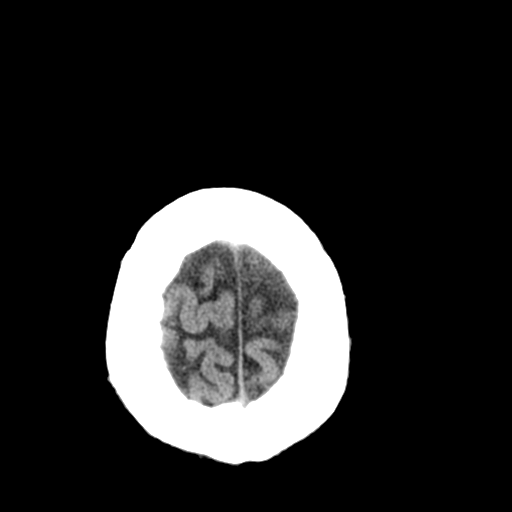
[im 27/36  brain]
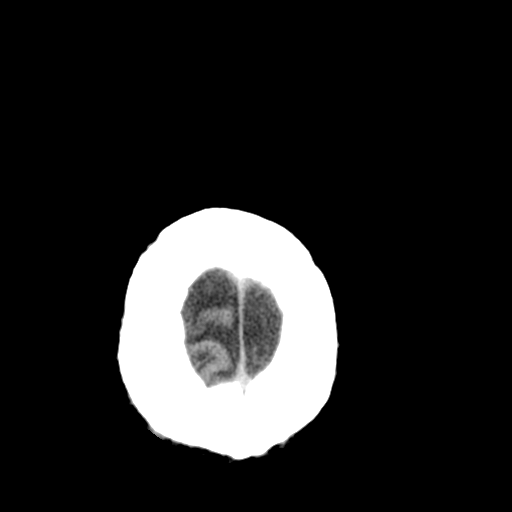
[im 27/36  bone]
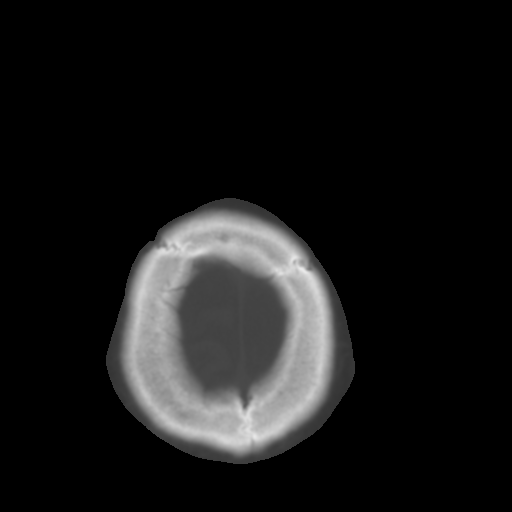
[im 29/36  brain]
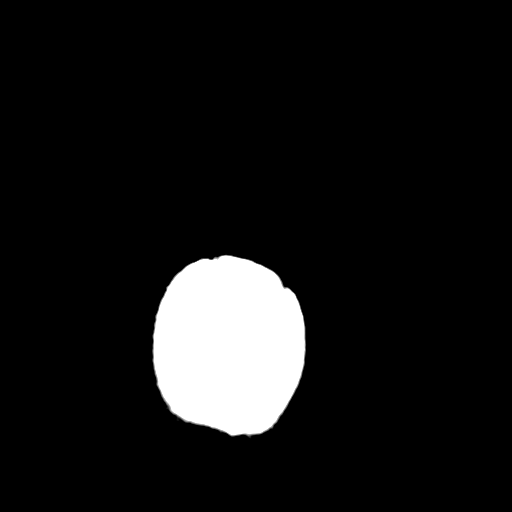
[im 32/36  brain]
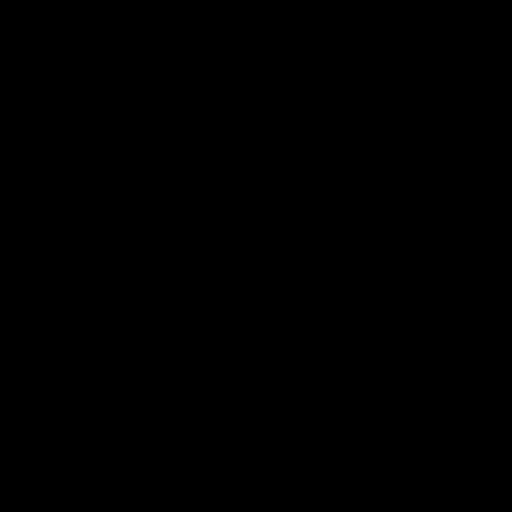
[im 34/36  brain]
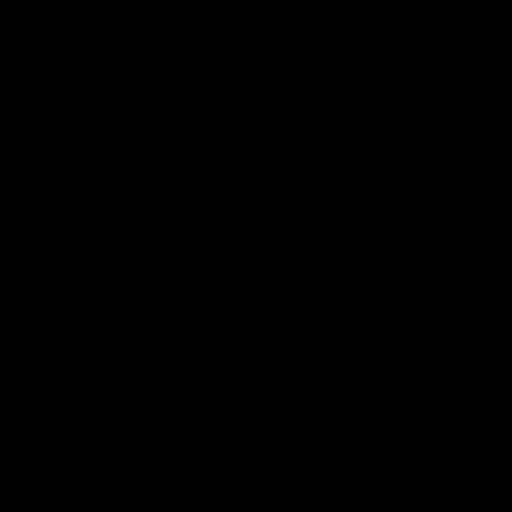

[16 of 30 positions shown; findings below may reference images not displayed]

FINDINGS: No intracranial hemorrhage.

Encephalomalacia anterior left frontal lobe suggestive of prior
infarct.  Result of prior trauma not excluded.

No CT evidence of large acute infarct.

Mild global atrophy without hydrocephalus.

No intracranial mass lesion detected on this unenhanced exam..

Mastoid air cells, middle ear cavities and visualized paranasal
sinuses are clear.

Visualized orbital structures unremarkable.
IMPRESSION: No intracranial hemorrhage.

Encephalomalacia anterior left frontal lobe suggestive of prior
infarct.  Result of prior trauma not excluded.

No CT evidence of large acute infarct.

Mild global atrophy without hydrocephalus.

## 2015-12-06 DIAGNOSIS — F419 Anxiety disorder, unspecified: Secondary | ICD-10-CM | POA: Diagnosis not present

## 2016-02-05 DIAGNOSIS — M25531 Pain in right wrist: Secondary | ICD-10-CM | POA: Diagnosis not present

## 2016-02-05 DIAGNOSIS — S6991XA Unspecified injury of right wrist, hand and finger(s), initial encounter: Secondary | ICD-10-CM | POA: Diagnosis not present

## 2016-02-05 DIAGNOSIS — W228XXA Striking against or struck by other objects, initial encounter: Secondary | ICD-10-CM | POA: Diagnosis not present

## 2016-02-05 DIAGNOSIS — R569 Unspecified convulsions: Secondary | ICD-10-CM | POA: Diagnosis not present

## 2016-02-05 DIAGNOSIS — M79641 Pain in right hand: Secondary | ICD-10-CM | POA: Diagnosis not present

## 2016-02-05 DIAGNOSIS — S60221A Contusion of right hand, initial encounter: Secondary | ICD-10-CM | POA: Diagnosis not present

## 2016-02-05 DIAGNOSIS — Z79899 Other long term (current) drug therapy: Secondary | ICD-10-CM | POA: Diagnosis not present

## 2016-05-02 DIAGNOSIS — F419 Anxiety disorder, unspecified: Secondary | ICD-10-CM | POA: Diagnosis not present

## 2016-05-15 DIAGNOSIS — Z299 Encounter for prophylactic measures, unspecified: Secondary | ICD-10-CM | POA: Diagnosis not present

## 2016-05-15 DIAGNOSIS — Z6828 Body mass index (BMI) 28.0-28.9, adult: Secondary | ICD-10-CM | POA: Diagnosis not present

## 2016-05-15 DIAGNOSIS — Z1389 Encounter for screening for other disorder: Secondary | ICD-10-CM | POA: Diagnosis not present

## 2016-05-15 DIAGNOSIS — Z0000 Encounter for general adult medical examination without abnormal findings: Secondary | ICD-10-CM | POA: Diagnosis not present

## 2016-06-26 DIAGNOSIS — Z299 Encounter for prophylactic measures, unspecified: Secondary | ICD-10-CM | POA: Diagnosis not present

## 2016-06-26 DIAGNOSIS — Z789 Other specified health status: Secondary | ICD-10-CM | POA: Diagnosis not present

## 2016-06-26 DIAGNOSIS — I10 Essential (primary) hypertension: Secondary | ICD-10-CM | POA: Diagnosis not present

## 2016-06-26 DIAGNOSIS — D649 Anemia, unspecified: Secondary | ICD-10-CM | POA: Diagnosis not present

## 2016-06-26 DIAGNOSIS — R5383 Other fatigue: Secondary | ICD-10-CM | POA: Diagnosis not present

## 2016-06-26 DIAGNOSIS — G40209 Localization-related (focal) (partial) symptomatic epilepsy and epileptic syndromes with complex partial seizures, not intractable, without status epilepticus: Secondary | ICD-10-CM | POA: Diagnosis not present

## 2016-06-26 DIAGNOSIS — F72 Severe intellectual disabilities: Secondary | ICD-10-CM | POA: Diagnosis not present

## 2016-07-25 DIAGNOSIS — F419 Anxiety disorder, unspecified: Secondary | ICD-10-CM | POA: Diagnosis not present

## 2016-08-30 DIAGNOSIS — Z23 Encounter for immunization: Secondary | ICD-10-CM | POA: Diagnosis not present

## 2016-10-03 DIAGNOSIS — S6991XA Unspecified injury of right wrist, hand and finger(s), initial encounter: Secondary | ICD-10-CM | POA: Diagnosis not present

## 2016-10-03 DIAGNOSIS — S60221A Contusion of right hand, initial encounter: Secondary | ICD-10-CM | POA: Diagnosis not present

## 2016-10-16 DIAGNOSIS — F419 Anxiety disorder, unspecified: Secondary | ICD-10-CM | POA: Diagnosis not present

## 2017-01-19 DIAGNOSIS — G40209 Localization-related (focal) (partial) symptomatic epilepsy and epileptic syndromes with complex partial seizures, not intractable, without status epilepticus: Secondary | ICD-10-CM | POA: Diagnosis not present

## 2017-01-19 DIAGNOSIS — I10 Essential (primary) hypertension: Secondary | ICD-10-CM | POA: Diagnosis not present

## 2017-01-19 DIAGNOSIS — Z299 Encounter for prophylactic measures, unspecified: Secondary | ICD-10-CM | POA: Diagnosis not present

## 2017-01-19 DIAGNOSIS — F72 Severe intellectual disabilities: Secondary | ICD-10-CM | POA: Diagnosis not present

## 2017-01-19 DIAGNOSIS — J069 Acute upper respiratory infection, unspecified: Secondary | ICD-10-CM | POA: Diagnosis not present

## 2017-01-19 DIAGNOSIS — K219 Gastro-esophageal reflux disease without esophagitis: Secondary | ICD-10-CM | POA: Diagnosis not present

## 2017-01-19 DIAGNOSIS — D696 Thrombocytopenia, unspecified: Secondary | ICD-10-CM | POA: Diagnosis not present

## 2017-01-19 DIAGNOSIS — Z789 Other specified health status: Secondary | ICD-10-CM | POA: Diagnosis not present

## 2017-01-19 DIAGNOSIS — R7989 Other specified abnormal findings of blood chemistry: Secondary | ICD-10-CM | POA: Diagnosis not present

## 2017-01-19 DIAGNOSIS — Z6826 Body mass index (BMI) 26.0-26.9, adult: Secondary | ICD-10-CM | POA: Diagnosis not present

## 2017-01-19 DIAGNOSIS — R569 Unspecified convulsions: Secondary | ICD-10-CM | POA: Diagnosis not present

## 2017-01-19 DIAGNOSIS — D569 Thalassemia, unspecified: Secondary | ICD-10-CM | POA: Diagnosis not present

## 2017-01-26 DIAGNOSIS — F419 Anxiety disorder, unspecified: Secondary | ICD-10-CM | POA: Diagnosis not present

## 2017-04-10 DIAGNOSIS — F72 Severe intellectual disabilities: Secondary | ICD-10-CM | POA: Diagnosis not present

## 2017-04-11 DIAGNOSIS — H4423 Degenerative myopia, bilateral: Secondary | ICD-10-CM | POA: Diagnosis not present

## 2017-04-12 DIAGNOSIS — F72 Severe intellectual disabilities: Secondary | ICD-10-CM | POA: Diagnosis not present

## 2017-04-13 DIAGNOSIS — F72 Severe intellectual disabilities: Secondary | ICD-10-CM | POA: Diagnosis not present

## 2017-05-01 DIAGNOSIS — F419 Anxiety disorder, unspecified: Secondary | ICD-10-CM | POA: Diagnosis not present

## 2017-05-21 DIAGNOSIS — Z79899 Other long term (current) drug therapy: Secondary | ICD-10-CM | POA: Diagnosis not present

## 2017-05-21 DIAGNOSIS — Z6825 Body mass index (BMI) 25.0-25.9, adult: Secondary | ICD-10-CM | POA: Diagnosis not present

## 2017-05-21 DIAGNOSIS — F72 Severe intellectual disabilities: Secondary | ICD-10-CM | POA: Diagnosis not present

## 2017-05-21 DIAGNOSIS — Z0000 Encounter for general adult medical examination without abnormal findings: Secondary | ICD-10-CM | POA: Diagnosis not present

## 2017-05-21 DIAGNOSIS — R569 Unspecified convulsions: Secondary | ICD-10-CM | POA: Diagnosis not present

## 2017-05-21 DIAGNOSIS — Z1389 Encounter for screening for other disorder: Secondary | ICD-10-CM | POA: Diagnosis not present

## 2017-05-21 DIAGNOSIS — Z299 Encounter for prophylactic measures, unspecified: Secondary | ICD-10-CM | POA: Diagnosis not present

## 2017-05-21 DIAGNOSIS — I10 Essential (primary) hypertension: Secondary | ICD-10-CM | POA: Diagnosis not present

## 2017-05-21 DIAGNOSIS — G40209 Localization-related (focal) (partial) symptomatic epilepsy and epileptic syndromes with complex partial seizures, not intractable, without status epilepticus: Secondary | ICD-10-CM | POA: Diagnosis not present

## 2017-05-21 DIAGNOSIS — D696 Thrombocytopenia, unspecified: Secondary | ICD-10-CM | POA: Diagnosis not present

## 2017-05-21 DIAGNOSIS — R5383 Other fatigue: Secondary | ICD-10-CM | POA: Diagnosis not present

## 2017-05-21 DIAGNOSIS — Z7189 Other specified counseling: Secondary | ICD-10-CM | POA: Diagnosis not present

## 2017-05-30 DIAGNOSIS — R5383 Other fatigue: Secondary | ICD-10-CM | POA: Diagnosis not present

## 2017-05-30 DIAGNOSIS — Z79899 Other long term (current) drug therapy: Secondary | ICD-10-CM | POA: Diagnosis not present

## 2017-05-30 DIAGNOSIS — I10 Essential (primary) hypertension: Secondary | ICD-10-CM | POA: Diagnosis not present

## 2017-08-01 DIAGNOSIS — F419 Anxiety disorder, unspecified: Secondary | ICD-10-CM | POA: Diagnosis not present

## 2017-08-27 DIAGNOSIS — Z299 Encounter for prophylactic measures, unspecified: Secondary | ICD-10-CM | POA: Diagnosis not present

## 2017-08-27 DIAGNOSIS — Z713 Dietary counseling and surveillance: Secondary | ICD-10-CM | POA: Diagnosis not present

## 2017-08-27 DIAGNOSIS — D696 Thrombocytopenia, unspecified: Secondary | ICD-10-CM | POA: Diagnosis not present

## 2017-08-27 DIAGNOSIS — F71 Moderate intellectual disabilities: Secondary | ICD-10-CM | POA: Diagnosis not present

## 2017-08-27 DIAGNOSIS — J069 Acute upper respiratory infection, unspecified: Secondary | ICD-10-CM | POA: Diagnosis not present

## 2017-08-27 DIAGNOSIS — Z6824 Body mass index (BMI) 24.0-24.9, adult: Secondary | ICD-10-CM | POA: Diagnosis not present

## 2017-08-27 DIAGNOSIS — G40209 Localization-related (focal) (partial) symptomatic epilepsy and epileptic syndromes with complex partial seizures, not intractable, without status epilepticus: Secondary | ICD-10-CM | POA: Diagnosis not present

## 2017-08-27 DIAGNOSIS — R569 Unspecified convulsions: Secondary | ICD-10-CM | POA: Diagnosis not present

## 2017-11-01 DIAGNOSIS — R1084 Generalized abdominal pain: Secondary | ICD-10-CM | POA: Diagnosis not present

## 2017-11-01 DIAGNOSIS — G40209 Localization-related (focal) (partial) symptomatic epilepsy and epileptic syndromes with complex partial seizures, not intractable, without status epilepticus: Secondary | ICD-10-CM | POA: Diagnosis not present

## 2017-11-01 DIAGNOSIS — R569 Unspecified convulsions: Secondary | ICD-10-CM | POA: Diagnosis not present

## 2017-11-01 DIAGNOSIS — D696 Thrombocytopenia, unspecified: Secondary | ICD-10-CM | POA: Diagnosis not present

## 2017-11-01 DIAGNOSIS — Z299 Encounter for prophylactic measures, unspecified: Secondary | ICD-10-CM | POA: Diagnosis not present

## 2017-11-01 DIAGNOSIS — Z6826 Body mass index (BMI) 26.0-26.9, adult: Secondary | ICD-10-CM | POA: Diagnosis not present

## 2017-11-19 ENCOUNTER — Encounter: Payer: Self-pay | Admitting: Internal Medicine

## 2017-11-29 DIAGNOSIS — F419 Anxiety disorder, unspecified: Secondary | ICD-10-CM | POA: Diagnosis not present

## 2017-12-05 ENCOUNTER — Encounter: Payer: Self-pay | Admitting: Nurse Practitioner

## 2017-12-05 ENCOUNTER — Ambulatory Visit (INDEPENDENT_AMBULATORY_CARE_PROVIDER_SITE_OTHER): Payer: Medicare Other | Admitting: Nurse Practitioner

## 2017-12-05 DIAGNOSIS — R1013 Epigastric pain: Secondary | ICD-10-CM

## 2017-12-05 DIAGNOSIS — R197 Diarrhea, unspecified: Secondary | ICD-10-CM | POA: Insufficient documentation

## 2017-12-05 DIAGNOSIS — R109 Unspecified abdominal pain: Secondary | ICD-10-CM | POA: Insufficient documentation

## 2017-12-05 DIAGNOSIS — K219 Gastro-esophageal reflux disease without esophagitis: Secondary | ICD-10-CM

## 2017-12-05 NOTE — Patient Instructions (Addendum)
1. Stop taking Prilosec. 2. Start taking Dexilant.  We will provide samples for a couple weeks. 3. Call us in 1-2 weeks and let us know if the Dexilant is helping his abdominal pain symptoms. 4. Are taking a daily probiotic for the next 2 months. 5. We will request the previous colonoscopy and endoscopy reports from any pain medical records. 6. I am putting in orders for stool studies to check for infections.  When you collect the stool samples bring them to the lab, not our office. 7. Return for follow-up in 2 months. 8. Call us if you have any questions or concerns.

## 2017-12-05 NOTE — Progress Notes (Signed)
Primary Care Physician:  Monico Blitz, MD Primary Gastroenterologist:  Dr. Gala Romney  Chief Complaint  Patient presents with  . Abdominal Pain  . Diarrhea    HPI:   Cole Stewart is a 42 y.o. male who presents on referral from primary care for abdominal pain.  The patient has not been seen by our office since 2011.  PCP notes reviewed.  The patient was seen by primary care on 11/01/2017 for complaints of diarrhea which was sudden and occurring intermittent for the previous 4 days.  Stools are noted to be watery and associated with abdominal pain.  Recommended referral to GI.  No history of colonoscopy or endoscopy in our system.  Per PCP past medical history documented EGD 06/2000 with documented antral ulcers CLOtest negative, urea breath test negative.  Colonoscopy also 06/2000 Flex will sigmoidoscopy documented acute colitis and internal hemorrhoids.  These were apparently done by Dr. Gala Romney any pain hospital.  Today he is accompanied by his caregiver. He resides at [facility] [place] home. He is apparently non-communicative but caregiver states he has been indicating abdominal pain. No N/V that they can tell. Patient points to epigastric region when asked where it hurts. He is on PPI daily. The caregiver thinks it hurts more in the mornings and after eating. This has been going on for a couple months. Diarrhea stared at the same time. Currently still with some diarrhea, but not as much. Has a bowel movement about 1-2 times a day. All his bowel movements are diarrhea. No noted hematochezia or melena. No recent fevers, chills. Consistently complains of a headache, typically when his stomach hurts. No indication of recent weight loss. No recent travel, new medications, or changes in diet. No indication of chest pain, dyspnea, dizziness, lightheadedness, syncope, near syncope. No indications any other upper or lower GI symptoms.  Past Medical History:  Diagnosis Date  . Anemia   . GERD  (gastroesophageal reflux disease)   . Mental retardation   . Mood disorder ([place])   . MVP (mitral valve prolapse)   . Recurrent abdominal pain   . Seizures ([place])   . Thrombocytopenia ([place])     Past Surgical History:  Procedure Laterality Date  . CHOLECYSTECTOMY      Current Outpatient Medications  Medication Sig Dispense Refill  . chlorhexidine (PERIDEX) 0.12 % solution Use as directed 15 mLs in the mouth or throat 2 (two) times daily. *Swish/Spit*    . clonazePAM (KLONOPIN) 0.5 MG tablet Take 0.5 mg by mouth 3 (three) times daily.    . divalproex (DEPAKOTE ER) 250 MG 24 hr tablet Take 500 mg by mouth at bedtime.    . divalproex (DEPAKOTE) 250 MG DR tablet Take 250 mg by mouth 2 (two) times daily. 0700 and 1200    . doxepin (SINEQUAN) 10 MG capsule Take 10 mg by mouth at bedtime.    . gabapentin (NEURONTIN) 400 MG capsule Take 400-1,200 mg by mouth 2 (two) times daily. Takes 1 capsule every morning and 3 capsules at bedtime    . Iloperidone (FANAPT) 6 MG TABS Take by mouth daily.     . iron polysaccharides (NIFEREX) 150 MG capsule Take 150 mg by mouth daily.    Marland Kitchen ketoconazole (NIZORAL) 2 % shampoo Apply 1 application topically 2 (two) times a week. *Lather, leave for 5 minutes, then rinse*    . levOCARNitine (CARNITOR) 330 MG tablet Take 330 mg by mouth 2 (two) times daily.    . Olopatadine HCl (PATADAY  OP) Apply to eye daily.    Marland Kitchen omega-3 acid ethyl esters (LOVAZA) 1 g capsule Take 2 g by mouth daily.    Marland Kitchen omeprazole (PRILOSEC) 40 MG capsule Take 40 mg by mouth daily.    . QUEtiapine (SEROQUEL) 400 MG tablet Take 400 mg by mouth daily.    . Vitamin D, Ergocalciferol, (DRISDOL) 50000 UNITS CAPS Take 50,000 Units by mouth every 30 (thirty) days.    . vitamin E 200 UNIT capsule Take 200 Units by mouth every morning.    . zolpidem (AMBIEN) 10 MG tablet Take 10 mg by mouth at bedtime.     No current facility-administered medications for this visit.     Allergies as of 12/05/2017  .  (No Known Allergies)    History reviewed. No pertinent family history.  Social History   Socioeconomic History  . Marital status: Single    Spouse name: Not on file  . Number of children: Not on file  . Years of education: Not on file  . Highest education level: Not on file  Social Needs  . Financial resource strain: Not on file  . Food insecurity - worry: Not on file  . Food insecurity - inability: Not on file  . Transportation needs - medical: Not on file  . Transportation needs - non-medical: Not on file  Occupational History  . Not on file  Tobacco Use  . Smoking status: Never Smoker  . Smokeless tobacco: Never Used  Substance and Sexual Activity  . Alcohol use: No  . Drug use: No  . Sexual activity: Not on file  Other Topics Concern  . Not on file  Social History Narrative  . Not on file    Review of Systems: LIMITED DUE TO LIMITED VERBALIZATION; assisted by caregiver General: Negative for anorexia, weight loss, fever, chills, fatigue, weakness. ENT: Negative for hoarseness, difficulty swallowing , nasal congestion. CV: Negative for chest pain, angina. Respiratory: Negative for dyspnea at rest, cough, sputum, wheezing.  GI: See history of present illness. MS: Negative for joint pain, low back pain.  Derm: Negative for rash or itching.  Endo: Negative for unusual weight change.  Heme: Negative for bruising or bleeding. Allergy: Negative for rash or hives.    Physical Exam: BP 130/76   Pulse 91   Temp 97.6 F (36.4 C) (Oral)   Ht 5\' 6"  (1.676 m)   Wt 171 lb (77.6 kg)   BMI 27.60 kg/m  General:   Alert and oriented. Pleasant and cooperative. Well-nourished and well-developed. Minimally verbal. Eyes:  Without icterus, sclera clear and conjunctiva pink.  Ears:  Normal auditory acuity. Cardiovascular:  S1, S2 present without murmurs appreciated. Extremities without clubbing or edema. Respiratory:  Clear to auscultation bilaterally. No wheezes, rales, or  rhonchi. No distress.  Gastrointestinal:  +BS, soft, and non-distended. Admits abdominal pain to palpation epigastric region. No HSM noted. No guarding or rebound. No masses appreciated.  Rectal:  Deferred  Musculoskalatal:  Symmetrical without gross deformities. Neurologic:  Alert and oriented x4;  grossly normal neurologically. Psych:  Alert and cooperative. Normal mood and affect. Heme/Lymph/Immune: No excessive bruising noted.    12/05/2017 2:46 PM   Disclaimer: This note was dictated with voice recognition software. Similar sounding words can inadvertently be transcribed and may not be corrected upon review.

## 2017-12-06 ENCOUNTER — Telehealth: Payer: Self-pay

## 2017-12-06 DIAGNOSIS — K219 Gastro-esophageal reflux disease without esophagitis: Secondary | ICD-10-CM

## 2017-12-06 DIAGNOSIS — R1013 Epigastric pain: Secondary | ICD-10-CM

## 2017-12-06 MED ORDER — DEXLANSOPRAZOLE 60 MG PO CPDR
60.0000 mg | DELAYED_RELEASE_CAPSULE | Freq: Every day | ORAL | 3 refills | Status: DC
Start: 2017-12-06 — End: 2018-03-11

## 2017-12-06 NOTE — Telephone Encounter (Signed)
Received a call from pts group home. They aren't able to give pt the Dexilant 60mg  samples provided at yesterdays visit. They would like a script called into [facility].

## 2017-12-06 NOTE — Telephone Encounter (Signed)
Rx sent to pharmacy per group home request.

## 2017-12-06 NOTE — Addendum Note (Signed)
Addended by: Gordy Levan, ERIC A on: 12/06/2017 03:16 PM   Modules accepted: Orders

## 2017-12-06 NOTE — Assessment & Plan Note (Signed)
The patient has been indicating abdominal pain as of late.  His visit is complicated by the fact that he is minimally tender noncommunicative.  He does have a caregiver in attendance and she indicates abdominal pain over the previous approximate 1 month.  He is on a PPI daily.  When asked to point to where it hurts he does point to the epigastric region and worsening GERD symptoms are a possible contributor to his symptoms.  I will have him stop Prilosec and start [place].  Samples were provided.  Follow-up in 2 months.

## 2017-12-06 NOTE — Telephone Encounter (Signed)
noted 

## 2017-12-06 NOTE — Assessment & Plan Note (Signed)
Abdominal pain is generally in the epigastric area when he asked to point to it.  Further GERD management as per above.  However, he is mentally altered and is essentially noncommunicative.  There is other possibilities based on his diarrhea symptoms which seem to be improving.  Further workup all as per above.  We can consider EGD if no improvement with possible TCS pending previous records and recommendations as well as his clinical progression.  Today he does not appear to be in overt pain, no distress apparent.  Follow-up in 2 months.  Call if any questions or concerns.

## 2017-12-06 NOTE — Assessment & Plan Note (Signed)
Patient began having diarrhea which is somewhat improved recently.  He is only having 1-2 stools a day.  However, his stools are always diarrhea per the caregiver.  Associated with some abdominal pain.  Another possible etiology is self-limiting gastroenteritis with postinfectious IBS.  At this point I will do stool studies to rule out occult infection.  I will have him start a probiotic for 60 days after he collects his stool studies.  We will also request his previous colonoscopy and EGD records from any pain hospital which were apparently completed in 2001.  He may need to eventually have a repeat colonoscopy and/or EGD if he does not have any improvement and pending previous records and recommendations.  Follow-up in 2 months.

## 2017-12-07 NOTE — Progress Notes (Signed)
CC'ED TO PCP 

## 2018-01-03 DIAGNOSIS — F419 Anxiety disorder, unspecified: Secondary | ICD-10-CM | POA: Diagnosis not present

## 2018-01-17 DIAGNOSIS — G40209 Localization-related (focal) (partial) symptomatic epilepsy and epileptic syndromes with complex partial seizures, not intractable, without status epilepticus: Secondary | ICD-10-CM | POA: Diagnosis not present

## 2018-01-17 DIAGNOSIS — R569 Unspecified convulsions: Secondary | ICD-10-CM | POA: Diagnosis not present

## 2018-01-17 DIAGNOSIS — Z299 Encounter for prophylactic measures, unspecified: Secondary | ICD-10-CM | POA: Diagnosis not present

## 2018-01-17 DIAGNOSIS — R197 Diarrhea, unspecified: Secondary | ICD-10-CM | POA: Diagnosis not present

## 2018-01-17 DIAGNOSIS — D696 Thrombocytopenia, unspecified: Secondary | ICD-10-CM | POA: Diagnosis not present

## 2018-01-17 DIAGNOSIS — Z6824 Body mass index (BMI) 24.0-24.9, adult: Secondary | ICD-10-CM | POA: Diagnosis not present

## 2018-01-17 DIAGNOSIS — Z789 Other specified health status: Secondary | ICD-10-CM | POA: Diagnosis not present

## 2018-01-21 DIAGNOSIS — R569 Unspecified convulsions: Secondary | ICD-10-CM | POA: Diagnosis not present

## 2018-01-21 DIAGNOSIS — Z789 Other specified health status: Secondary | ICD-10-CM | POA: Diagnosis not present

## 2018-01-21 DIAGNOSIS — D696 Thrombocytopenia, unspecified: Secondary | ICD-10-CM | POA: Diagnosis not present

## 2018-01-21 DIAGNOSIS — Z299 Encounter for prophylactic measures, unspecified: Secondary | ICD-10-CM | POA: Diagnosis not present

## 2018-01-21 DIAGNOSIS — I10 Essential (primary) hypertension: Secondary | ICD-10-CM | POA: Diagnosis not present

## 2018-01-21 DIAGNOSIS — Z6825 Body mass index (BMI) 25.0-25.9, adult: Secondary | ICD-10-CM | POA: Diagnosis not present

## 2018-01-21 DIAGNOSIS — F72 Severe intellectual disabilities: Secondary | ICD-10-CM | POA: Diagnosis not present

## 2018-01-30 ENCOUNTER — Ambulatory Visit (INDEPENDENT_AMBULATORY_CARE_PROVIDER_SITE_OTHER): Payer: Medicare Other | Admitting: Nurse Practitioner

## 2018-01-30 ENCOUNTER — Ambulatory Visit: Payer: Medicare Other | Admitting: Nurse Practitioner

## 2018-01-30 ENCOUNTER — Encounter: Payer: Self-pay | Admitting: Nurse Practitioner

## 2018-01-30 VITALS — BP 134/85 | HR 84 | Temp 95.4°F | Ht 66.0 in | Wt 169.2 lb

## 2018-01-30 DIAGNOSIS — K219 Gastro-esophageal reflux disease without esophagitis: Secondary | ICD-10-CM

## 2018-01-30 DIAGNOSIS — R1013 Epigastric pain: Secondary | ICD-10-CM | POA: Diagnosis not present

## 2018-01-30 MED ORDER — DICYCLOMINE HCL 10 MG PO CAPS: 10.0000 mg | ORAL_CAPSULE | Freq: Four times a day (QID) | ORAL | 3 refills | Status: DC | PRN

## 2018-01-30 NOTE — Progress Notes (Signed)
CC'ED TO PCP 

## 2018-01-30 NOTE — Patient Instructions (Signed)
1. Continue taking Dexilant 2. Start taking Bentyl 10 mg.  This can be taken up to 4 times a day as needed for abdominal pain/diarrhea. 3. Have labs drawn when you are able to. 4. Follow-up in 2 months. 5. Call us in 2 weeks and let us know if there is any improvement with the addition of Bentyl.

## 2018-01-30 NOTE — Progress Notes (Signed)
Referring Provider: Monico Blitz, MD Primary Care Physician:  Monico Blitz, MD Primary GI:  Dr. Gala Romney  Chief Complaint  Patient presents with  . Gastroesophageal Reflux  . Diarrhea    HPI:   Cole Stewart is a 42 y.o. male who presents for follow-up on epigastric pain and diarrhea.  The patient was last seen in our office 12/05/2017 for the same as well as GERD. Per previously reviewed PCP past medical history documented EGD 06/2000 with documented antral ulcers CLOtest negative, urea breath test negative.  Colonoscopy also 06/2000 Flex will sigmoidoscopy documented acute colitis and internal hemorrhoids.  These were apparently done by Dr. Gala Romney at [facility].  His last visit he was accompanied by his caregiver as he resides at [facility].  Non-communicative but caregiver indicates he has been indicating abdominal pain.  No nausea and vomiting that they are aware of.  He indicates his epigastric area when asked where his abdominal pain is.  He is on PPI daily, facility seems to think worse after eating in the mornings.  Diarrhea started the same time but improved.  Having 1-2 bowel movements a day.  All bowel movements are loose.  No other GI symptoms.  Recommended stop Prilosec, start Dexilant and samples were provided.  Call in 1-2 weeks with a progress report.  Start daily probiotic for 2 months.  Stool studies to check for infection.  Follow-up in 2 months.  Progress report was called by the facility.  Today he is accompanied by facility staff. Note from manager indicating continued abdominal pain. When asked about pain he points and grunts. Indicates generalized abdominal pain. He is non-verbal. Still with some diarrhea (last week.) No indication of any improvement after bowel movement. Staff states he does have frequent bowel movements. Tried to collect stools studies and he wouldn't let the facility collect those. No noted N/V, fever, chills, unintentional weight loss.  No other discernable GI symptoms.   Past Medical History:  Diagnosis Date  . Anemia   . GERD (gastroesophageal reflux disease)   . Mental retardation   . Mood disorder ([place])   . MVP (mitral valve prolapse)   . Recurrent abdominal pain   . Seizures ([place])   . Thrombocytopenia ([place])     Past Surgical History:  Procedure Laterality Date  . CHOLECYSTECTOMY      Current Outpatient Medications  Medication Sig Dispense Refill  . chlorhexidine (PERIDEX) 0.12 % solution Use as directed 15 mLs in the mouth or throat 2 (two) times daily. *Swish/Spit*    . clonazePAM (KLONOPIN) 0.5 MG tablet Take 0.5 mg by mouth 3 (three) times daily.    Marland Kitchen dexlansoprazole (DEXILANT) 60 MG capsule Take 1 capsule (60 mg total) by mouth daily. 30 capsule 3  . divalproex (DEPAKOTE ER) 250 MG 24 hr tablet Take 500 mg by mouth at bedtime.    . divalproex (DEPAKOTE) 250 MG DR tablet Take 250 mg by mouth 2 (two) times daily. 0700 and 1200    . doxepin (SINEQUAN) 10 MG capsule Take 10 mg by mouth at bedtime.    . gabapentin (NEURONTIN) 400 MG capsule Take 400-1,200 mg by mouth 2 (two) times daily. Takes 1 capsule every morning and 3 capsules at bedtime    . Iloperidone (FANAPT) 6 MG TABS Take by mouth daily.     . iron polysaccharides (NIFEREX) 150 MG capsule Take 150 mg by mouth daily.    Marland Kitchen ketoconazole (NIZORAL) 2 % shampoo Apply 1  application topically 2 (two) times a week. *Lather, leave for 5 minutes, then rinse*    . levOCARNitine (CARNITOR) 330 MG tablet Take 330 mg by mouth 2 (two) times daily.    . Olopatadine HCl (PATADAY OP) Apply to eye daily.    Marland Kitchen omega-3 acid ethyl esters (LOVAZA) 1 g capsule Take 2 g by mouth daily.    . QUEtiapine (SEROQUEL) 400 MG tablet Take 400 mg by mouth daily.    . Vitamin D, Ergocalciferol, (DRISDOL) 50000 UNITS CAPS Take 50,000 Units by mouth every 30 (thirty) days.    . vitamin E 200 UNIT capsule Take 200 Units by mouth every morning.    . zolpidem (AMBIEN) 10 MG tablet Take  10 mg by mouth at bedtime.     No current facility-administered medications for this visit.     Allergies as of 01/30/2018  . (No Known Allergies)    History reviewed. No pertinent family history.  Social History   Socioeconomic History  . Marital status: Single    Spouse name: Not on file  . Number of children: Not on file  . Years of education: Not on file  . Highest education level: Not on file  Occupational History  . Not on file  Social Needs  . Financial resource strain: Not on file  . Food insecurity:    Worry: Not on file    Inability: Not on file  . Transportation needs:    Medical: Not on file    Non-medical: Not on file  Tobacco Use  . Smoking status: Never Smoker  . Smokeless tobacco: Never Used  Substance and Sexual Activity  . Alcohol use: No  . Drug use: No  . Sexual activity: Not on file  Lifestyle  . Physical activity:    Days per week: Not on file    Minutes per session: Not on file  . Stress: Not on file  Relationships  . Social connections:    Talks on phone: Not on file    Gets together: Not on file    Attends religious service: Not on file    Active member of club or organization: Not on file    Attends meetings of clubs or organizations: Not on file    Relationship status: Not on file  Other Topics Concern  . Not on file  Social History Narrative  . Not on file    Review of Systems: VERY LIMITED DUE TO ON-VERBAL. ASSISTANCE WITH FACILITY STAFF. General: Negative for weight loss, fever. ENT: Negative for hoarseness, difficulty swallowing. CV: Negative for chest pain.  Respiratory: Negative for dyspnea at rest, cough, sputum, wheezing.  GI: See history of present illness. Endo: Negative for unusual weight change.  Heme: Negative for bruising or bleeding.   Physical Exam: BP 134/85   Pulse 84   Temp (!) 95.4 F (35.2 C) (Axillary)   Ht 5\' 6"  (1.676 m)   Wt 169 lb 3.2 oz (76.7 kg)   BMI 27.31 kg/m  General:   Alert and  non-verbal. Pleasant and cooperative. Well-nourished and well-developed.  Eyes:  Without icterus, sclera clear and conjunctiva pink.  Ears:  Normal auditory acuity. Cardiovascular:  S1, S2 present without murmurs appreciated. Extremities without clubbing or edema. Respiratory:  Clear to auscultation bilaterally. No wheezes, rales, or rhonchi. No distress.  Gastrointestinal:  +BS, soft, and non-distended. Mild generalized TTP. No HSM noted. No guarding or rebound. No masses appreciated.  Rectal:  Deferred  Musculoskalatal:  Symmetrical without gross deformities.  Normal posture. Neurologic:  Alert and oriented x4;  grossly normal neurologically. Psych:  Alert and cooperative. Normal mood and affect. Heme/Lymph/Immune: No excessive bruising noted.    01/30/2018 11:10 AM   Disclaimer: This note was dictated with voice recognition software. Similar sounding words can inadvertently be transcribed and may not be corrected upon review.

## 2018-01-30 NOTE — Assessment & Plan Note (Signed)
The patient indicates epigastric abdominal pain.  Facility states his abdominal pain is ongoing.  However, on exam when I point to use all areas of his abdomen he indicates it hurts.  He does still have some diarrhea.  Query IBS symptoms versus GERD.  He is currently on Dexilant.  Also possibility of celiac disease with bloating/diarrhea.  It is very difficult to obtain history as he is nonverbal.  At this point I will try Bentyl 10 mg 4 times a day as needed for abdominal pain and diarrhea.  We will order celiac panel.  Call in 2 weeks to notify of any improvement.  Follow-up in 2 months.  If he continues to have abdominal pain we can hold his PPI for 2 weeks and check H. pylori breath test.

## 2018-01-30 NOTE — Assessment & Plan Note (Signed)
The patient has historically had GERD symptoms.  He was started on Dexilant.  He continues with abdominal pain as per below.  I am not completely convinced that it is epigastric in nature only.  Further workup as per below.  Continue on Dexilant.  There might be room to hold PPI for 2 weeks and do an H. pylori breath test in the future if there is no improvement with management per below.  Follow-up in 2 months.

## 2018-02-04 ENCOUNTER — Telehealth: Payer: Self-pay | Admitting: Internal Medicine

## 2018-02-04 NOTE — Telephone Encounter (Signed)
Spoke with staff at the group home. They were wondering if pts Bentyl was called into pts pharmacy. Bentyl was sent in on 01/30/18. They will check with pts pharmacy to make sure it's ready for pickup.

## 2018-02-04 NOTE — Telephone Encounter (Signed)
Patient called to see if his reflux meds have been called in

## 2018-02-05 DIAGNOSIS — K219 Gastro-esophageal reflux disease without esophagitis: Secondary | ICD-10-CM | POA: Diagnosis not present

## 2018-02-05 DIAGNOSIS — R1013 Epigastric pain: Secondary | ICD-10-CM | POA: Diagnosis not present

## 2018-02-12 LAB — TISSUE TRANSGLUTAMINASE, IGA: (tTG) Ab, IgA: 2 U/mL

## 2018-02-12 LAB — GLIADIN ANTIBODIES, SERUM
GLIADIN IGA: 34 U — AB
GLIADIN IGG: 7 U

## 2018-02-12 LAB — RETICULIN ANTIBODIES, IGA W TITER: Reticulin IGA Screen: NEGATIVE

## 2018-02-26 NOTE — Progress Notes (Signed)
Called and informed Ivonne Andrew at [facility].  Mailing a Gluten Free diet to them.

## 2018-03-11 ENCOUNTER — Other Ambulatory Visit: Payer: Self-pay | Admitting: Nurse Practitioner

## 2018-03-11 DIAGNOSIS — K219 Gastro-esophageal reflux disease without esophagitis: Secondary | ICD-10-CM

## 2018-03-11 DIAGNOSIS — R1013 Epigastric pain: Secondary | ICD-10-CM

## 2018-03-21 ENCOUNTER — Telehealth: Payer: Self-pay | Admitting: *Deleted

## 2018-03-21 DIAGNOSIS — R1013 Epigastric pain: Secondary | ICD-10-CM

## 2018-03-21 DIAGNOSIS — K219 Gastro-esophageal reflux disease without esophagitis: Secondary | ICD-10-CM

## 2018-03-21 MED ORDER — DICYCLOMINE HCL 10 MG PO CAPS: 10.0000 mg | ORAL_CAPSULE | Freq: Four times a day (QID) | ORAL | 3 refills | Status: DC | PRN

## 2018-03-21 NOTE — Addendum Note (Signed)
Addended by: Gordy Levan, Alinah Sheard A on: 03/21/2018 04:41 PM   Modules accepted: Orders

## 2018-03-21 NOTE — Telephone Encounter (Signed)
That's odd, it's on the OV as sent.  I'll resend now. Please follow-up with [facility] and make sure they've received it.  Thanks!

## 2018-03-21 NOTE — Telephone Encounter (Signed)
Patient group home called stating that [facility] told them they never received Rx for patient bentyl. They are needing this resent in. Please advise Randall Hiss thanks

## 2018-03-22 NOTE — Telephone Encounter (Signed)
Called Cole [facility] and confirmed they received Rx

## 2018-04-04 ENCOUNTER — Ambulatory Visit: Payer: Medicare Other | Admitting: Nurse Practitioner

## 2018-04-11 DIAGNOSIS — F419 Anxiety disorder, unspecified: Secondary | ICD-10-CM | POA: Diagnosis not present

## 2018-05-22 DIAGNOSIS — Z1331 Encounter for screening for depression: Secondary | ICD-10-CM | POA: Diagnosis not present

## 2018-05-22 DIAGNOSIS — Z6823 Body mass index (BMI) 23.0-23.9, adult: Secondary | ICD-10-CM | POA: Diagnosis not present

## 2018-05-22 DIAGNOSIS — Z1339 Encounter for screening examination for other mental health and behavioral disorders: Secondary | ICD-10-CM | POA: Diagnosis not present

## 2018-05-22 DIAGNOSIS — Z0000 Encounter for general adult medical examination without abnormal findings: Secondary | ICD-10-CM | POA: Diagnosis not present

## 2018-05-22 DIAGNOSIS — Z299 Encounter for prophylactic measures, unspecified: Secondary | ICD-10-CM | POA: Diagnosis not present

## 2018-05-22 DIAGNOSIS — I10 Essential (primary) hypertension: Secondary | ICD-10-CM | POA: Diagnosis not present

## 2018-05-30 DIAGNOSIS — R5383 Other fatigue: Secondary | ICD-10-CM | POA: Diagnosis not present

## 2018-05-30 DIAGNOSIS — Z79899 Other long term (current) drug therapy: Secondary | ICD-10-CM | POA: Diagnosis not present

## 2018-06-19 DIAGNOSIS — H04121 Dry eye syndrome of right lacrimal gland: Secondary | ICD-10-CM | POA: Diagnosis not present

## 2018-07-03 ENCOUNTER — Encounter: Payer: Self-pay | Admitting: Internal Medicine

## 2018-07-03 ENCOUNTER — Encounter

## 2018-07-03 ENCOUNTER — Telehealth: Payer: Self-pay | Admitting: Nurse Practitioner

## 2018-07-03 ENCOUNTER — Ambulatory Visit: Payer: Medicare Other | Admitting: Nurse Practitioner

## 2018-07-03 NOTE — Progress Notes (Deleted)
Referring Provider: Monico Blitz, MD Primary Care Physician:  Monico Blitz, MD Primary GI:  Dr. Gala Romney  No chief complaint on file.   HPI:   Cole Stewart is a 42 y.o. male who presents for abdominal pain and diarrhea.  Patient was last seen in our office 01/30/2018 for GERD and epigastric pain.  Patient resides at [facility].  Patient is noncommunicative.  He previously indicated abdominal pain in the epigastric area on PPI daily.  At his last visit he was accompanied by staff and a note from the manager indicating continued abdominal pain.  He again pointed to his epigastric area and grunted.  Still some diarrhea the previous week and no indication of improvement in abdominal pain after a bowel movement.  He does have frequent bowel movements per the staff.  Attempt to collect stool studies and he would like to facility collect those.  No other GI symptoms.  Recommended continue Dexilant, start Bentyl 10 mg as needed for abdominal pain/diarrhea, labs including celiac panel, follow-up in 2 months, progress report in 2 weeks.  They did call our office indicating the prescription had been sent despite confirmed send at his last office visit.  The prescription was resent.  No progress report was called to our office indicating effect on symptoms.  Today he is accompanied by ***.   Past Medical History:  Diagnosis Date  . Anemia   . GERD (gastroesophageal reflux disease)   . Mental retardation   . Mood disorder ([place])   . MVP (mitral valve prolapse)   . Recurrent abdominal pain   . Seizures ([place])   . Thrombocytopenia ([place])     Past Surgical History:  Procedure Laterality Date  . CHOLECYSTECTOMY      Current Outpatient Medications  Medication Sig Dispense Refill  . chlorhexidine (PERIDEX) 0.12 % solution Use as directed 15 mLs in the mouth or throat 2 (two) times daily. *Swish/Spit*    . clonazePAM (KLONOPIN) 0.5 MG tablet Take 0.5 mg by mouth 3 (three) times daily.    Marland Kitchen  DEXILANT 60 MG capsule TAKE 1 CAPSULE BY MOUTH ONCE A DAY. 30 capsule 11  . dicyclomine (BENTYL) 10 MG capsule Take 1 capsule (10 mg total) by mouth 4 (four) times daily as needed for spasms. 90 capsule 3  . divalproex (DEPAKOTE ER) 250 MG 24 hr tablet Take 500 mg by mouth at bedtime.    . divalproex (DEPAKOTE) 250 MG DR tablet Take 250 mg by mouth 2 (two) times daily. 0700 and 1200    . doxepin (SINEQUAN) 10 MG capsule Take 10 mg by mouth at bedtime.    . gabapentin (NEURONTIN) 400 MG capsule Take 400-1,200 mg by mouth 2 (two) times daily. Takes 1 capsule every morning and 3 capsules at bedtime    . Iloperidone (FANAPT) 6 MG TABS Take by mouth daily.     . iron polysaccharides (NIFEREX) 150 MG capsule Take 150 mg by mouth daily.    Marland Kitchen ketoconazole (NIZORAL) 2 % shampoo Apply 1 application topically 2 (two) times a week. *Lather, leave for 5 minutes, then rinse*    . levOCARNitine (CARNITOR) 330 MG tablet Take 330 mg by mouth 2 (two) times daily.    . Olopatadine HCl (PATADAY OP) Apply to eye daily.    Marland Kitchen omega-3 acid ethyl esters (LOVAZA) 1 g capsule Take 2 g by mouth daily.    . QUEtiapine (SEROQUEL) 400 MG tablet Take 400 mg by mouth daily.    Marland Kitchen  Vitamin D, Ergocalciferol, (DRISDOL) 50000 UNITS CAPS Take 50,000 Units by mouth every 30 (thirty) days.    . vitamin E 200 UNIT capsule Take 200 Units by mouth every morning.    . zolpidem (AMBIEN) 10 MG tablet Take 10 mg by mouth at bedtime.     No current facility-administered medications for this visit.     Allergies as of 07/03/2018  . (No Known Allergies)    No family history on file.  Social History   Socioeconomic History  . Marital status: Single    Spouse name: Not on file  . Number of children: Not on file  . Years of education: Not on file  . Highest education level: Not on file  Occupational History  . Not on file  Social Needs  . Financial resource strain: Not on file  . Food insecurity:    Worry: Not on file     Inability: Not on file  . Transportation needs:    Medical: Not on file    Non-medical: Not on file  Tobacco Use  . Smoking status: Never Smoker  . Smokeless tobacco: Never Used  Substance and Sexual Activity  . Alcohol use: No  . Drug use: No  . Sexual activity: Not on file  Lifestyle  . Physical activity:    Days per week: Not on file    Minutes per session: Not on file  . Stress: Not on file  Relationships  . Social connections:    Talks on phone: Not on file    Gets together: Not on file    Attends religious service: Not on file    Active member of club or organization: Not on file    Attends meetings of clubs or organizations: Not on file    Relationship status: Not on file  Other Topics Concern  . Not on file  Social History Narrative  . Not on file    Review of Systems: General: Negative for anorexia, weight loss, fever, chills, fatigue, weakness. Eyes: Negative for vision changes.  ENT: Negative for hoarseness, difficulty swallowing , nasal congestion. CV: Negative for chest pain, angina, palpitations, dyspnea on exertion, peripheral edema.  Respiratory: Negative for dyspnea at rest, dyspnea on exertion, cough, sputum, wheezing.  GI: See history of present illness. GU:  Negative for dysuria, hematuria, urinary incontinence, urinary frequency, nocturnal urination.  MS: Negative for joint pain, low back pain.  Derm: Negative for rash or itching.  Neuro: Negative for weakness, abnormal sensation, seizure, frequent headaches, memory loss, confusion.  Psych: Negative for anxiety, depression, suicidal ideation, hallucinations.  Endo: Negative for unusual weight change.  Heme: Negative for bruising or bleeding. Allergy: Negative for rash or hives.   Physical Exam: There were no vitals taken for this visit. General:   Alert and oriented. Pleasant and cooperative. Well-nourished and well-developed.  Head:  Normocephalic and atraumatic. Eyes:  Without icterus, sclera  clear and conjunctiva pink.  Ears:  Normal auditory acuity. Mouth:  No deformity or lesions, oral mucosa pink.  Throat/Neck:  Supple, without mass or thyromegaly. Cardiovascular:  S1, S2 present without murmurs appreciated. Normal pulses noted. Extremities without clubbing or edema. Respiratory:  Clear to auscultation bilaterally. No wheezes, rales, or rhonchi. No distress.  Gastrointestinal:  +BS, soft, non-tender and non-distended. No HSM noted. No guarding or rebound. No masses appreciated.  Rectal:  Deferred  Musculoskalatal:  Symmetrical without gross deformities. Normal posture. Skin:  Intact without significant lesions or rashes. Neurologic:  Alert and oriented x4;  grossly normal neurologically. Psych:  Alert and cooperative. Normal mood and affect. Heme/Lymph/Immune: No significant cervical adenopathy. No excessive bruising noted.    07/03/2018 1:31 PM   Disclaimer: This note was dictated with voice recognition software. Similar sounding words can inadvertently be transcribed and may not be corrected upon review.

## 2018-07-03 NOTE — Telephone Encounter (Signed)
Noted  

## 2018-07-03 NOTE — Telephone Encounter (Signed)
Patient was a no show and letter sent  °

## 2018-07-31 DIAGNOSIS — F419 Anxiety disorder, unspecified: Secondary | ICD-10-CM | POA: Diagnosis not present

## 2018-08-01 DIAGNOSIS — Z79899 Other long term (current) drug therapy: Secondary | ICD-10-CM | POA: Diagnosis not present

## 2018-08-13 DIAGNOSIS — Z23 Encounter for immunization: Secondary | ICD-10-CM | POA: Diagnosis not present

## 2018-08-27 DIAGNOSIS — R569 Unspecified convulsions: Secondary | ICD-10-CM | POA: Diagnosis not present

## 2018-08-27 DIAGNOSIS — Z713 Dietary counseling and surveillance: Secondary | ICD-10-CM | POA: Diagnosis not present

## 2018-08-27 DIAGNOSIS — Z299 Encounter for prophylactic measures, unspecified: Secondary | ICD-10-CM | POA: Diagnosis not present

## 2018-08-27 DIAGNOSIS — I10 Essential (primary) hypertension: Secondary | ICD-10-CM | POA: Diagnosis not present

## 2018-08-27 DIAGNOSIS — Z6823 Body mass index (BMI) 23.0-23.9, adult: Secondary | ICD-10-CM | POA: Diagnosis not present

## 2018-08-28 DIAGNOSIS — F419 Anxiety disorder, unspecified: Secondary | ICD-10-CM | POA: Diagnosis not present

## 2018-10-14 ENCOUNTER — Ambulatory Visit (INDEPENDENT_AMBULATORY_CARE_PROVIDER_SITE_OTHER): Payer: Medicare Other | Admitting: Nurse Practitioner

## 2018-10-14 ENCOUNTER — Encounter: Payer: Self-pay | Admitting: *Deleted

## 2018-10-14 ENCOUNTER — Encounter: Payer: Self-pay | Admitting: Nurse Practitioner

## 2018-10-14 VITALS — BP 129/81 | HR 82 | Temp 97.0°F | Ht 66.0 in | Wt 156.2 lb

## 2018-10-14 DIAGNOSIS — R1013 Epigastric pain: Secondary | ICD-10-CM | POA: Diagnosis not present

## 2018-10-14 DIAGNOSIS — R634 Abnormal weight loss: Secondary | ICD-10-CM | POA: Diagnosis not present

## 2018-10-14 DIAGNOSIS — R197 Diarrhea, unspecified: Secondary | ICD-10-CM | POA: Diagnosis not present

## 2018-10-14 NOTE — Progress Notes (Signed)
Referring Provider: Monico Blitz, MD Primary Care Physician:  Monico Blitz, MD Primary GI:  Dr. Gala Romney  Chief Complaint  Patient presents with  . Diarrhea    7 times per day x few months. Denies blood in stool. Patient started gluten free diet but has not changed  . Abdominal Pain    constant daily pain    HPI:   Cole Stewart is a 42 y.o. male who presents for abdominal pain and diarrhea.  The patient was last seen in our office 01/30/2018 for GERD and epigastric pain.  Chronic history of GERD.  At some point an EGD was completed in 06/2000 with documented antral ulcers, CLOtest negative, urea breath test negative.  Colonoscopy also 06/2000 which documented acute colitis and internal hemorrhoids.  At his previous visit it was noted he resides at the [facility].  The patient is noncommunicative but is typically accompanied by a caregiver.  Despite addition of probiotic and changing him from Prilosec to [place] the facility indicates having continued abdominal pain.  This seems to be generalized per his pointing and grunting.  Still has some diarrhea and no improvement in abdominal pain after a bowel movement.  Staff feels he has frequent bowel movements.  They have attempted to collect stool studies but he would not allow them.  No other GI symptoms.  Recommended continue Dexilant, start Bentyl 10 mg up to 4 times a day as needed, labs, follow-up in 2 months.  Request a progress report in 2 weeks related to Bentyl.  Labs completed 02/05/2018 indicate mild, nonspecific elevation in creatinine IgA, other celiac levels normal.  The patient was a no-show to his follow-up visit in August. It appears stool studies ordered in January were never able to be completed.  Today he is accompanied by facility staff. Staff states she doesn't have a list of his current medications, will fax them to [place] when they return to the facility. To the staff, it seems his abdominal pain is becoming more frequent,  near constant. Still with loose stools, about 7 a day. Denies hematochezia. She states he wasn't able to collect a stool sample because he would empty the hat; unsure if a staff member was able to be in the bathroom with him. Staff states he's difficult to manage in the bathroom. Tried a gluten-free diet which didn't help. States he doesn't consume much dairy. He isn't given ice cream because it seems like it makes his stomach upset. Denies hematochezia, melena, fever, chills, no noted weight loss by staff. Objectively he is down about 13 lbs in the past 9 months. No indications of chest pain, dyspnea, dizziness, lightheadedness, syncope, near syncope by facility staff. No other upper or lower GI symptoms.  Past Medical History:  Diagnosis Date  . Anemia   . GERD (gastroesophageal reflux disease)   . Mental retardation   . Mood disorder ([place])   . MVP (mitral valve prolapse)   . Recurrent abdominal pain   . Seizures ([place])   . Thrombocytopenia ([place])     Past Surgical History:  Procedure Laterality Date  . CHOLECYSTECTOMY      Current Outpatient Medications  Medication Sig Dispense Refill  . chlorhexidine (PERIDEX) 0.12 % solution Use as directed 15 mLs in the mouth or throat 2 (two) times daily. *Swish/Spit*    . clonazePAM (KLONOPIN) 0.5 MG tablet Take 0.5 mg by mouth 3 (three) times daily.    Marland Kitchen DEXILANT 60 MG capsule TAKE 1 CAPSULE BY  MOUTH ONCE A DAY. 30 capsule 11  . dicyclomine (BENTYL) 10 MG capsule Take 1 capsule (10 mg total) by mouth 4 (four) times daily as needed for spasms. 90 capsule 3  . divalproex (DEPAKOTE ER) 250 MG 24 hr tablet Take 500 mg by mouth at bedtime.    . divalproex (DEPAKOTE) 250 MG DR tablet Take 250 mg by mouth 2 (two) times daily. 0700 and 1200    . doxepin (SINEQUAN) 10 MG capsule Take 10 mg by mouth at bedtime.    . gabapentin (NEURONTIN) 400 MG capsule Take 400-1,200 mg by mouth 2 (two) times daily. Takes 1 capsule every morning and 3 capsules at bedtime      . Iloperidone (FANAPT) 6 MG TABS Take by mouth daily.     . iron polysaccharides (NIFEREX) 150 MG capsule Take 150 mg by mouth daily.    Marland Kitchen ketoconazole (NIZORAL) 2 % shampoo Apply 1 application topically 2 (two) times a week. *Lather, leave for 5 minutes, then rinse*    . levOCARNitine (CARNITOR) 330 MG tablet Take 330 mg by mouth 2 (two) times daily.    . Olopatadine HCl (PATADAY OP) Apply to eye daily.    Marland Kitchen omega-3 acid ethyl esters (LOVAZA) 1 g capsule Take 2 g by mouth daily.    . QUEtiapine (SEROQUEL) 400 MG tablet Take 400 mg by mouth daily.    . Vitamin D, Ergocalciferol, (DRISDOL) 50000 UNITS CAPS Take 50,000 Units by mouth every 30 (thirty) days.    . vitamin E 200 UNIT capsule Take 200 Units by mouth every morning.    . zolpidem (AMBIEN) 10 MG tablet Take 10 mg by mouth at bedtime.     No current facility-administered medications for this visit.     Allergies as of 10/14/2018  . (No Known Allergies)    History reviewed. No pertinent family history.  Social History   Socioeconomic History  . Marital status: Single    Spouse name: Not on file  . Number of children: Not on file  . Years of education: Not on file  . Highest education level: Not on file  Occupational History  . Not on file  Social Needs  . Financial resource strain: Not on file  . Food insecurity:    Worry: Not on file    Inability: Not on file  . Transportation needs:    Medical: Not on file    Non-medical: Not on file  Tobacco Use  . Smoking status: Never Smoker  . Smokeless tobacco: Never Used  Substance and Sexual Activity  . Alcohol use: No  . Drug use: No  . Sexual activity: Not on file  Lifestyle  . Physical activity:    Days per week: Not on file    Minutes per session: Not on file  . Stress: Not on file  Relationships  . Social connections:    Talks on phone: Not on file    Gets together: Not on file    Attends religious service: Not on file    Active member of club or  organization: Not on file    Attends meetings of clubs or organizations: Not on file    Relationship status: Not on file  Other Topics Concern  . Not on file  Social History Narrative  . Not on file    Review of Systems: VERY LIMITED DUE TO NON-VERBAL. ASSISTANCE WITH FACILITY STAFF. General: Negative for weight loss, fever. ENT: Negative for hoarseness, difficulty swallowing. CV: Negative for  chest pain.  Respiratory: Negative for dyspnea at rest, cough, sputum, wheezing.  GI: See history of present illness. Endo: Negative for unusual weight change.  Heme: Negative for bruising or bleeding.   Physical Exam: BP 129/81   Pulse 82   Temp (!) 97 F (36.1 C) (Oral)   Ht 5\' 6"  (1.676 m)   Wt 156 lb 3.2 oz (70.9 kg)   BMI 25.21 kg/m  General:  Pleasant and cooperative. Well-nourished and well-developed.  Eyes:  Without icterus, sclera clear and conjunctiva pink.  Ears:  Normal auditory acuity. Cardiovascular:  S1, S2 present without murmurs appreciated. Extremities without clubbing or edema. Respiratory:  Clear to auscultation bilaterally. No wheezes, rales, or rhonchi. No distress.  Gastrointestinal:  +BS, soft, and non-distended. No signs of TTP. No HSM noted. No guarding or rebound. No masses appreciated.  Rectal:  Deferred  Musculoskalatal:  Symmetrical without gross deformities. Skin:  Intact without significant lesions or rashes. Neurologic:  Alert and oriented x4;  grossly normal neurologically. Psych:  Alert and cooperative. Heme/Lymph/Immune: No excessive bruising noted.    10/18/2018 3:15 PM   Disclaimer: This note was dictated with voice recognition software. Similar sounding words can inadvertently be transcribed and may not be corrected upon review.

## 2018-10-14 NOTE — Patient Instructions (Signed)
1. We will help schedule your CT scan. 2. Continue to try to get a stool sample for stool testing. 3. You can stop gluten free diet as it does not seem to be helping.  Trial a lactose-free diet for 2 weeks and if no improvement, can stop this as well. 4. We may need to do a colonoscopy pending results. 5. Please call us with the medication list so we can update our records. 6. Call if any questions or concerns.  At [facility] we value your feedback. You may receive a survey about your visit today. Please share your experience as we strive to create trusting relationships with our patients to provide genuine, compassionate, quality care.  We appreciate your understanding and patience as we review any laboratory studies, imaging, and other diagnostic tests that are ordered as we care for you. Our office policy is 5 business days for review of these results, and any emergent or urgent results are addressed in a timely manner for your best interest. If you do not hear from our office in 1 week, please contact us.   We also encourage the use of MyChart, which contains your medical information for your review as well. If you are not enrolled in this feature, an access code is on this after visit summary for your convenience. Thank you for allowing [place] to be involved in your care.  It was great to see you today!  I hope you have a Merry Christmas!!

## 2018-10-17 DIAGNOSIS — K219 Gastro-esophageal reflux disease without esophagitis: Secondary | ICD-10-CM | POA: Diagnosis not present

## 2018-10-17 DIAGNOSIS — Z6823 Body mass index (BMI) 23.0-23.9, adult: Secondary | ICD-10-CM | POA: Diagnosis not present

## 2018-10-17 DIAGNOSIS — Z299 Encounter for prophylactic measures, unspecified: Secondary | ICD-10-CM | POA: Diagnosis not present

## 2018-10-17 DIAGNOSIS — G40209 Localization-related (focal) (partial) symptomatic epilepsy and epileptic syndromes with complex partial seizures, not intractable, without status epilepticus: Secondary | ICD-10-CM | POA: Diagnosis not present

## 2018-10-17 DIAGNOSIS — I10 Essential (primary) hypertension: Secondary | ICD-10-CM | POA: Diagnosis not present

## 2018-10-18 ENCOUNTER — Encounter: Payer: Self-pay | Admitting: Nurse Practitioner

## 2018-10-18 DIAGNOSIS — F419 Anxiety disorder, unspecified: Secondary | ICD-10-CM | POA: Diagnosis not present

## 2018-10-18 NOTE — Assessment & Plan Note (Signed)
The patient is having some mild persistent weight loss with about 13 pounds loss in the past 9 months subjectively.  At this point given weight loss and persistent abdominal pain we will check a CT of the abdomen and pelvis.  Also schedule colonoscopy with stool sampling to further evaluate his symptoms.  In the interim, the facility will help try to get stool samples again.  Return for follow-up in 3 months.

## 2018-10-18 NOTE — Assessment & Plan Note (Signed)
The patient is still having loose stools about 7 today.  The facility was previously unable to collect stool samples because he is not cooperative in the bathroom.  This is in the setting of persistent abdominal pain.  We will have the facility again try to attempt a stool collection.  We are planning for colonoscopy with stool sampling.  Follow-up in 3 months.  Labs as well.  Proceed with TCS on propofol/MAC with Dr. Gala Romney in near future: the risks, benefits, and alternatives have been discussed with the patient in detail. The patient states understanding and desires to proceed.  The patient is currently on Klonopin, Neurontin, Seroquel, Ambien.  No other anticoagulants, anxiolytics, chronic pain medications, or antidepressants.  We will plan for the procedure on propofol/MAC to promote adequate sedation.

## 2018-10-18 NOTE — Assessment & Plan Note (Signed)
Persistent abdominal pain as well as worsening diarrhea.  He does not have overt objective signs of abdominal pain with palpation on exam today.  Difficult to discern and get subjective information given patient minimally verbal medication.  He is accompanied by staff.  Further work-up as per above including CT of the abdomen and pelvis, labs, colonoscopy.  Attempted stool sampling, or stool sampling in colonoscopy.  Follow-up in 3 months.

## 2018-10-21 NOTE — Progress Notes (Signed)
cc'd to pcp 

## 2018-10-23 DIAGNOSIS — R197 Diarrhea, unspecified: Secondary | ICD-10-CM | POA: Diagnosis not present

## 2018-10-23 DIAGNOSIS — R634 Abnormal weight loss: Secondary | ICD-10-CM | POA: Diagnosis not present

## 2018-10-23 DIAGNOSIS — R1013 Epigastric pain: Secondary | ICD-10-CM | POA: Diagnosis not present

## 2018-10-23 LAB — COMPREHENSIVE METABOLIC PANEL
AG Ratio: 1.5 (calc) (ref 1.0–2.5)
ALT: 31 U/L (ref 9–46)
AST: 21 U/L (ref 10–40)
Albumin: 4.2 g/dL (ref 3.6–5.1)
Alkaline phosphatase (APISO): 81 U/L (ref 40–115)
BUN: 13 mg/dL (ref 7–25)
CO2: 30 mmol/L (ref 20–32)
CREATININE: 0.7 mg/dL (ref 0.60–1.35)
Calcium: 9.3 mg/dL (ref 8.6–10.3)
Chloride: 103 mmol/L (ref 98–110)
GLUCOSE: 92 mg/dL (ref 65–139)
Globulin: 2.8 g/dL (calc) (ref 1.9–3.7)
Potassium: 4.2 mmol/L (ref 3.5–5.3)
Sodium: 139 mmol/L (ref 135–146)
Total Bilirubin: 0.9 mg/dL (ref 0.2–1.2)
Total Protein: 7 g/dL (ref 6.1–8.1)

## 2018-10-23 LAB — CBC WITH DIFFERENTIAL/PLATELET
Absolute Monocytes: 519 cells/uL (ref 200–950)
BASOS ABS: 18 {cells}/uL (ref 0–200)
Basophils Relative: 0.2 %
EOS ABS: 475 {cells}/uL (ref 15–500)
Eosinophils Relative: 5.4 %
HCT: 41.8 % (ref 38.5–50.0)
Hemoglobin: 12.1 g/dL — ABNORMAL LOW (ref 13.2–17.1)
Lymphs Abs: 3326 cells/uL (ref 850–3900)
MCH: 20.4 pg — AB (ref 27.0–33.0)
MCHC: 28.9 g/dL — AB (ref 32.0–36.0)
MCV: 70.4 fL — ABNORMAL LOW (ref 80.0–100.0)
MONOS PCT: 5.9 %
Neutro Abs: 4462 cells/uL (ref 1500–7800)
Neutrophils Relative %: 50.7 %
PLATELETS: 133 10*3/uL — AB (ref 140–400)
RBC: 5.94 10*6/uL — ABNORMAL HIGH (ref 4.20–5.80)
RDW: 19.6 % — AB (ref 11.0–15.0)
TOTAL LYMPHOCYTE: 37.8 %
WBC: 8.8 10*3/uL (ref 3.8–10.8)

## 2018-10-23 LAB — LIPASE: Lipase: 21 U/L (ref 7–60)

## 2018-10-24 ENCOUNTER — Ambulatory Visit (HOSPITAL_COMMUNITY)
Admission: RE | Admit: 2018-10-24 | Discharge: 2018-10-24 | Disposition: A | Discharge status: Home / Self Care | Payer: Medicare Other | Source: Ambulatory Visit | Attending: Nurse Practitioner | Admitting: Nurse Practitioner

## 2018-10-24 DIAGNOSIS — K760 Fatty (change of) liver, not elsewhere classified: Secondary | ICD-10-CM | POA: Diagnosis not present

## 2018-10-24 DIAGNOSIS — R634 Abnormal weight loss: Secondary | ICD-10-CM | POA: Insufficient documentation

## 2018-10-24 DIAGNOSIS — R197 Diarrhea, unspecified: Secondary | ICD-10-CM | POA: Diagnosis not present

## 2018-10-24 DIAGNOSIS — R1013 Epigastric pain: Secondary | ICD-10-CM

## 2018-10-24 MED ORDER — IOPAMIDOL (ISOVUE-300) INJECTION 61%
100.0000 mL | Freq: Once | INTRAVENOUS | Status: AC | PRN
  Administered 2018-10-24: 100 mL via INTRAVENOUS

## 2018-10-28 ENCOUNTER — Other Ambulatory Visit: Payer: Self-pay

## 2018-10-28 DIAGNOSIS — R634 Abnormal weight loss: Secondary | ICD-10-CM

## 2018-10-28 DIAGNOSIS — R197 Diarrhea, unspecified: Secondary | ICD-10-CM

## 2018-10-28 DIAGNOSIS — R109 Unspecified abdominal pain: Secondary | ICD-10-CM

## 2018-10-28 MED ORDER — NA SULFATE-K SULFATE-MG SULF 17.5-3.13-1.6 GM/177ML PO SOLN: 1.0000 | ORAL | 0 refills | Status: DC

## 2018-11-04 ENCOUNTER — Ambulatory Visit: Payer: Medicare Other | Admitting: Nurse Practitioner

## 2018-11-26 NOTE — Patient Instructions (Signed)
JERSON FURUKAWA  11/26/2018     @PREFPERIOPPHARMACY @   Your procedure is scheduled on  12/05/2018.  Report to [facility] at  830   A.M.  Call this number if you have problems the morning of surgery:  [phone]   Remember:  Follow the diet and prep instructions given to you by Dr Roseanne Kaufman office.                    Take these medicines the morning of surgery with A SIP OF WATER clonazepam, dexilant, divalproex, gabapentin, IIoperidone.    Do not wear jewelry, make-up or nail polish.  Do not wear lotions, powders, or perfumes, or deodorant.  Do not shave 48 hours prior to surgery.  Men may shave face and neck.  Do not bring valuables to the hospital.  [facility] is not responsible for any belongings or valuables.  Contacts, dentures or bridgework may not be worn into surgery.  Leave your suitcase in the car.  After surgery it may be brought to your room.  For patients admitted to the hospital, discharge time will be determined by your treatment team.  Patients discharged the day of surgery will not be allowed to drive home.   Name and phone number of your driver:   family Special instructions:  None  Please read over the following fact sheets that you were given. Anesthesia Post-op Instructions and Care and Recovery After Surgery       Colonoscopy, Adult A colonoscopy is an exam to look at the large intestine. It is done to check for problems, such as:  Lumps (tumors).  Growths (polyps).  Swelling (inflammation).  Bleeding. What happens before the procedure? Eating and drinking Follow instructions from your doctor about eating and drinking. These instructions may include:  A few days before the procedure - follow a low-fiber diet. ? Avoid nuts. ? Avoid seeds. ? Avoid dried fruit. ? Avoid raw fruits. ? Avoid vegetables.  1-3 days before the procedure - follow a clear liquid diet. Avoid liquids that have red or purple dye. Drink only clear  liquids, such as: ? Clear broth or bouillon. ? Black coffee or tea. ? Clear juice. ? Clear soft drinks or sports drinks. ? Gelatin dessert. ? Popsicles.  On the day of the procedure - do not eat or drink anything during the 2 hours before the procedure. Up to 2 hours before the procedure, you may continue to drink clear liquids, such as water or clear fruit juice.  Bowel prep If you were prescribed an oral bowel prep:  Take it as told by your doctor. Starting the day before your procedure, you will need to drink a lot of liquid. The liquid will cause you to poop (have bowel movements) until your poop is almost clear or light green.  To clean out your colon, you may also be given: ? Laxative medicines. ? Instructions about how to use an enema.  If your skin or butt gets irritated from diarrhea, you may: ? Wipe the area with wipes that have medicine in them, such as adult wet wipes with aloe and vitamin E. ? Put something on your skin that soothes the area, such as petroleum jelly.  If you throw up (vomit) while drinking the bowel prep, take a break for up to 60 minutes. Then begin the bowel prep again. If you keep throwing up and you cannot take the bowel prep  without throwing up, call your doctor. General instructions  Ask your doctor about: ? Changing or stopping your normal medicines. This is important if you take iron pills, diabetes medicines, or blood thinners. ? Taking medicines such as aspirin and ibuprofen. These medicines can thin your blood. Do not take these medicines unless your doctor tells you to take them.  Plan to have someone take you home from the hospital or clinic. What happens during the procedure?   An IV tube may be put into one of your veins.  You will be given medicine to help you relax (sedative).  To reduce your risk of infection: ? Your doctors will wash their hands. ? Your anal area will be washed with soap.  You will be asked to lie on your side  with your knees bent.  Your doctor will get a long, thin, flexible tube ready. The tube will have a camera and a light on the end.  The tube will be put into your anus.  The tube will be gently put into your large intestine.  Air will be delivered into your large intestine to keep it open. You may feel some pressure or cramping.  The camera will be used to take photos.  A small tissue sample may be removed for testing (biopsy).  If small growths are found, your doctor may remove them and have them checked for cancer.  The tube that was put into your anus will be slowly removed. The procedure may vary among doctors and hospitals. What happens after the procedure?  Your doctor will check on you often until the medicines you were given have worn off.  Do not drive for 24 hours after the procedure.  You may have a small amount of blood in your poop.  You may pass gas.  You may have mild cramps or bloating in your belly (abdomen).  It is up to you to get the results of your procedure. Ask your doctor, or the department performing the procedure, when your results will be ready. Summary  A colonoscopy is an exam to look at the large intestine.  Follow instructions from your doctor about eating and drinking before the procedure.  If you were prescribed an oral bowel prep to clean out your colon, take it as told by your doctor.  Your doctor will check on you often until the medicines you were given have worn off.  Plan to have someone take you home from the hospital or clinic. This information is not intended to replace advice given to you by your health care provider. Make sure you discuss any questions you have with your health care provider. Document Released: 11/25/2010 Document Revised: 08/22/2017 Document Reviewed: 01/04/2016 Elsevier Interactive Patient Education  2019 [employer]  Colonoscopy, Adult, Care After This sheet gives you information about how to care for  yourself after your procedure. Your health care provider may also give you more specific instructions. If you have problems or questions, contact your health care provider. What can I expect after the procedure? After the procedure, it is common to have:  A small amount of blood in your stool for 24 hours after the procedure.  Some gas.  Mild abdominal cramping or bloating. Follow these instructions at home: General instructions  For the first 24 hours after the procedure: ? Do not drive or use machinery. ? Do not sign important documents. ? Do not drink alcohol. ? Do your regular daily activities at a slower pace than normal. ?  Eat soft, easy-to-digest foods.  Take over-the-counter or prescription medicines only as told by your health care provider. Relieving cramping and bloating   Try walking around when you have cramps or feel bloated.  Apply heat to your abdomen as told by your health care provider. Use a heat source that your health care provider recommends, such as a moist heat pack or a heating pad. ? Place a towel between your skin and the heat source. ? Leave the heat on for 20-30 minutes. ? Remove the heat if your skin turns bright red. This is especially important if you are unable to feel pain, heat, or cold. You may have a greater risk of getting burned. Eating and drinking   Drink enough fluid to keep your urine pale yellow.  Resume your normal diet as instructed by your health care provider. Avoid heavy or fried foods that are hard to digest.  Avoid drinking alcohol for as long as instructed by your health care provider. Contact a health care provider if:  You have blood in your stool 2-3 days after the procedure. Get help right away if:  You have more than a small spotting of blood in your stool.  You pass large blood clots in your stool.  Your abdomen is swollen.  You have nausea or vomiting.  You have a fever.  You have increasing abdominal pain  that is not relieved with medicine. Summary  After the procedure, it is common to have a small amount of blood in your stool. You may also have mild abdominal cramping and bloating.  For the first 24 hours after the procedure, do not drive or use machinery, sign important documents, or drink alcohol.  Contact your health care provider if you have a lot of blood in your stool, nausea or vomiting, a fever, or increased abdominal pain. This information is not intended to replace advice given to you by your health care provider. Make sure you discuss any questions you have with your health care provider. Document Released: 06/06/2004 Document Revised: 08/15/2017 Document Reviewed: 01/04/2016 Elsevier Interactive Patient Education  2019 [place] Anesthesia is a term that refers to techniques, procedures, and medicines that help a person stay safe and comfortable during a medical procedure. Monitored anesthesia care, or sedation, is one type of anesthesia. Your anesthesia specialist may recommend sedation if you will be having a procedure that does not require you to be unconscious, such as:  Cataract surgery.  A dental procedure.  A biopsy.  A colonoscopy. During the procedure, you may receive a medicine to help you relax (sedative). There are three levels of sedation:  Mild sedation. At this level, you may feel awake and relaxed. You will be able to follow directions.  Moderate sedation. At this level, you will be sleepy. You may not remember the procedure.  Deep sedation. At this level, you will be asleep. You will not remember the procedure. The more medicine you are given, the deeper your level of sedation will be. Depending on how you respond to the procedure, the anesthesia specialist may change your level of sedation or the type of anesthesia to fit your needs. An anesthesia specialist will monitor you closely during the procedure. Let your health care  provider know about:  Any allergies you have.  All medicines you are taking, including vitamins, herbs, eye drops, creams, and over-the-counter medicines.  Any use of steroids (by mouth or as a cream).  Any problems you or family  members have had with sedatives and anesthetic medicines.  Any blood disorders you have.  Any surgeries you have had.  Any medical conditions you have, such as sleep apnea.  Whether you are pregnant or may be pregnant.  Any use of cigarettes, alcohol, or street drugs. What are the risks? Generally, this is a safe procedure. However, problems may occur, including:  Getting too much medicine (oversedation).  Nausea.  Allergic reaction to medicines.  Trouble breathing. If this happens, a breathing tube may be used to help with breathing. It will be removed when you are awake and breathing on your own.  Heart trouble.  Lung trouble. Before the procedure Staying hydrated Follow instructions from your health care provider about hydration, which may include:  Up to 2 hours before the procedure - you may continue to drink clear liquids, such as water, clear fruit juice, black coffee, and plain tea. Eating and drinking restrictions Follow instructions from your health care provider about eating and drinking, which may include:  8 hours before the procedure - stop eating heavy meals or foods such as meat, fried foods, or fatty foods.  6 hours before the procedure - stop eating light meals or foods, such as toast or cereal.  6 hours before the procedure - stop drinking milk or drinks that contain milk.  2 hours before the procedure - stop drinking clear liquids. Medicines Ask your health care provider about:  Changing or stopping your regular medicines. This is especially important if you are taking diabetes medicines or blood thinners.  Taking medicines such as aspirin and ibuprofen. These medicines can thin your blood. Do not take these medicines  before your procedure if your health care provider instructs you not to. Tests and exams  You will have a physical exam.  You may have blood tests done to show: ? How well your kidneys and liver are working. ? How well your blood can clot. General instructions  Plan to have someone take you home from the hospital or clinic.  If you will be going home right after the procedure, plan to have someone with you for 24 hours.  What happens during the procedure?  Your blood pressure, heart rate, breathing, level of pain and overall condition will be monitored.  An IV tube will be inserted into one of your veins.  Your anesthesia specialist will give you medicines as needed to keep you comfortable during the procedure. This may mean changing the level of sedation.  The procedure will be performed. After the procedure  Your blood pressure, heart rate, breathing rate, and blood oxygen level will be monitored until the medicines you were given have worn off.  Do not drive for 24 hours if you received a sedative.  You may: ? Feel sleepy, clumsy, or nauseous. ? Feel forgetful about what happened after the procedure. ? Have a sore throat if you had a breathing tube during the procedure. ? Vomit. This information is not intended to replace advice given to you by your health care provider. Make sure you discuss any questions you have with your health care provider. Document Released: 07/19/2005 Document Revised: 03/31/2016 Document Reviewed: 02/13/2016 Elsevier Interactive Patient Education  2019 [place], Care After These instructions provide you with information about caring for yourself after your procedure. Your health care provider may also give you more specific instructions. Your treatment has been planned according to current medical practices, but problems sometimes occur. Call your health care provider if  you have any problems or questions after your  procedure. What can I expect after the procedure? After your procedure, you may:  Feel sleepy for several hours.  Feel clumsy and have poor balance for several hours.  Feel forgetful about what happened after the procedure.  Have poor judgment for several hours.  Feel nauseous or vomit.  Have a sore throat if you had a breathing tube during the procedure. Follow these instructions at home: For at least 24 hours after the procedure:      Have a responsible adult stay with you. It is important to have someone help care for you until you are awake and alert.  Rest as needed.  Do not: ? Participate in activities in which you could fall or become injured. ? Drive. ? Use heavy machinery. ? Drink alcohol. ? Take sleeping pills or medicines that cause drowsiness. ? Make important decisions or sign legal documents. ? Take care of children on your own. Eating and drinking  Follow the diet that is recommended by your health care provider.  If you vomit, drink water, juice, or soup when you can drink without vomiting.  Make sure you have little or no nausea before eating solid foods. General instructions  Take over-the-counter and prescription medicines only as told by your health care provider.  If you have sleep apnea, surgery and certain medicines can increase your risk for breathing problems. Follow instructions from your health care provider about wearing your sleep device: ? Anytime you are sleeping, including during daytime naps. ? While taking prescription pain medicines, sleeping medicines, or medicines that make you drowsy.  If you smoke, do not smoke without supervision.  Keep all follow-up visits as told by your health care provider. This is important. Contact a health care provider if:  You keep feeling nauseous or you keep vomiting.  You feel light-headed.  You develop a rash.  You have a fever. Get help right away if:  You have trouble  breathing. Summary  For several hours after your procedure, you may feel sleepy and have poor judgment.  Have a responsible adult stay with you for at least 24 hours or until you are awake and alert. This information is not intended to replace advice given to you by your health care provider. Make sure you discuss any questions you have with your health care provider. Document Released: 02/13/2016 Document Revised: 06/08/2017 Document Reviewed: 02/13/2016 Elsevier Interactive Patient Education  2019 [employer].

## 2018-11-29 ENCOUNTER — Ambulatory Visit (HOSPITAL_COMMUNITY)
Admission: RE | Admit: 2018-11-29 | Discharge: 2018-11-29 | Disposition: A | Discharge status: Home / Self Care | Payer: Medicare Other | Source: Ambulatory Visit | Attending: Internal Medicine | Admitting: Internal Medicine

## 2018-11-29 ENCOUNTER — Other Ambulatory Visit: Payer: Self-pay

## 2018-11-29 ENCOUNTER — Encounter (HOSPITAL_COMMUNITY): Payer: Self-pay

## 2018-11-29 DIAGNOSIS — Z01818 Encounter for other preprocedural examination: Secondary | ICD-10-CM | POA: Diagnosis not present

## 2018-11-29 LAB — CBC WITH DIFFERENTIAL/PLATELET
Abs Immature Granulocytes: 0.02 10*3/uL (ref 0.00–0.07)
Basophils Absolute: 0 10*3/uL (ref 0.0–0.1)
Basophils Relative: 0 %
Eosinophils Absolute: 0.2 10*3/uL (ref 0.0–0.5)
Eosinophils Relative: 3 %
HCT: 39.7 % (ref 39.0–52.0)
Hemoglobin: 11.4 g/dL — ABNORMAL LOW (ref 13.0–17.0)
Immature Granulocytes: 0 %
Lymphocytes Relative: 52 %
Lymphs Abs: 3.3 10*3/uL (ref 0.7–4.0)
MCH: 20.3 pg — ABNORMAL LOW (ref 26.0–34.0)
MCHC: 28.7 g/dL — ABNORMAL LOW (ref 30.0–36.0)
MCV: 70.6 fL — ABNORMAL LOW (ref 80.0–100.0)
Monocytes Absolute: 0.6 10*3/uL (ref 0.1–1.0)
Monocytes Relative: 9 %
NRBC: 0.3 % — AB (ref 0.0–0.2)
Neutro Abs: 2.4 10*3/uL (ref 1.7–7.7)
Neutrophils Relative %: 36 %
PLATELETS: 90 10*3/uL — AB (ref 150–400)
RBC: 5.62 MIL/uL (ref 4.22–5.81)
RDW: 19.6 % — ABNORMAL HIGH (ref 11.5–15.5)
WBC: 6.5 10*3/uL (ref 4.0–10.5)

## 2018-12-03 DIAGNOSIS — Z87891 Personal history of nicotine dependence: Secondary | ICD-10-CM | POA: Diagnosis not present

## 2018-12-03 DIAGNOSIS — I10 Essential (primary) hypertension: Secondary | ICD-10-CM | POA: Diagnosis not present

## 2018-12-03 DIAGNOSIS — Z6822 Body mass index (BMI) 22.0-22.9, adult: Secondary | ICD-10-CM | POA: Diagnosis not present

## 2018-12-03 DIAGNOSIS — Z299 Encounter for prophylactic measures, unspecified: Secondary | ICD-10-CM | POA: Diagnosis not present

## 2018-12-03 DIAGNOSIS — R569 Unspecified convulsions: Secondary | ICD-10-CM | POA: Diagnosis not present

## 2018-12-03 DIAGNOSIS — D696 Thrombocytopenia, unspecified: Secondary | ICD-10-CM | POA: Diagnosis not present

## 2018-12-05 ENCOUNTER — Ambulatory Visit (HOSPITAL_COMMUNITY): Payer: Medicare Other | Admitting: Anesthesiology

## 2018-12-05 ENCOUNTER — Encounter (HOSPITAL_COMMUNITY): Payer: Self-pay | Admitting: *Deleted

## 2018-12-05 ENCOUNTER — Encounter (HOSPITAL_COMMUNITY)
Admission: RE | Disposition: A | Discharge status: Home / Self Care | Payer: Self-pay | Source: Ambulatory Visit | Attending: Internal Medicine

## 2018-12-05 ENCOUNTER — Ambulatory Visit (HOSPITAL_COMMUNITY)
Admission: RE | Admit: 2018-12-05 | Discharge: 2018-12-05 | Disposition: A | Discharge status: Home / Self Care | Payer: Medicare Other | Source: Ambulatory Visit | Attending: Internal Medicine | Admitting: Internal Medicine

## 2018-12-05 DIAGNOSIS — Z79899 Other long term (current) drug therapy: Secondary | ICD-10-CM | POA: Diagnosis not present

## 2018-12-05 DIAGNOSIS — Z9049 Acquired absence of other specified parts of digestive tract: Secondary | ICD-10-CM | POA: Diagnosis not present

## 2018-12-05 DIAGNOSIS — R197 Diarrhea, unspecified: Secondary | ICD-10-CM | POA: Diagnosis not present

## 2018-12-05 DIAGNOSIS — K529 Noninfective gastroenteritis and colitis, unspecified: Secondary | ICD-10-CM | POA: Insufficient documentation

## 2018-12-05 DIAGNOSIS — I341 Nonrheumatic mitral (valve) prolapse: Secondary | ICD-10-CM | POA: Insufficient documentation

## 2018-12-05 DIAGNOSIS — R634 Abnormal weight loss: Secondary | ICD-10-CM | POA: Diagnosis not present

## 2018-12-05 DIAGNOSIS — G8929 Other chronic pain: Secondary | ICD-10-CM | POA: Diagnosis not present

## 2018-12-05 DIAGNOSIS — D120 Benign neoplasm of cecum: Secondary | ICD-10-CM | POA: Diagnosis not present

## 2018-12-05 DIAGNOSIS — R109 Unspecified abdominal pain: Secondary | ICD-10-CM

## 2018-12-05 DIAGNOSIS — K219 Gastro-esophageal reflux disease without esophagitis: Secondary | ICD-10-CM | POA: Insufficient documentation

## 2018-12-05 DIAGNOSIS — R569 Unspecified convulsions: Secondary | ICD-10-CM | POA: Diagnosis not present

## 2018-12-05 DIAGNOSIS — D696 Thrombocytopenia, unspecified: Secondary | ICD-10-CM | POA: Insufficient documentation

## 2018-12-05 DIAGNOSIS — D649 Anemia, unspecified: Secondary | ICD-10-CM | POA: Insufficient documentation

## 2018-12-05 DIAGNOSIS — F79 Unspecified intellectual disabilities: Secondary | ICD-10-CM | POA: Diagnosis not present

## 2018-12-05 HISTORY — PX: POLYPECTOMY: SHX5525

## 2018-12-05 HISTORY — PX: COLONOSCOPY WITH PROPOFOL: SHX5780

## 2018-12-05 SURGERY — COLONOSCOPY WITH PROPOFOL
Anesthesia: Monitor Anesthesia Care

## 2018-12-05 MED ORDER — MEPERIDINE HCL 100 MG/ML IJ SOLN: 6.2500 mg | INTRAMUSCULAR | Status: DC | PRN

## 2018-12-05 MED ORDER — PROPOFOL 500 MG/50ML IV EMUL
INTRAVENOUS | Status: DC | PRN
  Administered 2018-12-05: 10:00:00 via INTRAVENOUS
  Administered 2018-12-05: 150 ug/kg/min via INTRAVENOUS

## 2018-12-05 MED ORDER — LACTATED RINGERS IV SOLN: INTRAVENOUS | Status: DC

## 2018-12-05 MED ORDER — CHLORHEXIDINE GLUCONATE CLOTH 2 % EX PADS: 6.0000 | MEDICATED_PAD | Freq: Once | CUTANEOUS | Status: DC

## 2018-12-05 MED ORDER — GLYCOPYRROLATE 0.2 MG/ML IJ SOLN
INTRAMUSCULAR | Status: DC | PRN
  Administered 2018-12-05: 0.2 mg via INTRAVENOUS

## 2018-12-05 MED ORDER — LACTATED RINGERS IV SOLN
INTRAVENOUS | Status: DC
  Administered 2018-12-05: 09:00:00 via INTRAVENOUS

## 2018-12-05 MED ORDER — LIDOCAINE HCL (CARDIAC) PF 100 MG/5ML IV SOSY
PREFILLED_SYRINGE | INTRAVENOUS | Status: DC | PRN
  Administered 2018-12-05: 40 mg via INTRAVENOUS

## 2018-12-05 MED ORDER — HYDROCODONE-ACETAMINOPHEN 7.5-325 MG PO TABS: 1.0000 | ORAL_TABLET | Freq: Once | ORAL | Status: DC | PRN

## 2018-12-05 MED ORDER — PROMETHAZINE HCL 25 MG/ML IJ SOLN: 6.2500 mg | INTRAMUSCULAR | Status: DC | PRN

## 2018-12-05 MED ORDER — HYDROMORPHONE HCL 1 MG/ML IJ SOLN: 0.2500 mg | INTRAMUSCULAR | Status: DC | PRN

## 2018-12-05 NOTE — Anesthesia Postprocedure Evaluation (Signed)
Anesthesia Post Note  Patient: Cole Stewart  Procedure(s) Performed: COLONOSCOPY WITH PROPOFOL (N/A ) POLYPECTOMY  Patient location during evaluation: PACU Anesthesia Type: MAC Level of consciousness: awake and alert and patient cooperative Pain management: satisfactory to patient Vital Signs Assessment: post-procedure vital signs reviewed and stable Respiratory status: spontaneous breathing Cardiovascular status: stable Postop Assessment: no apparent nausea or vomiting Anesthetic complications: no     Last Vitals:  Vitals:   12/05/18 1000 12/05/18 1015  BP: 99/61 112/79  Pulse: 86 100  Resp: 13 18  Temp:  37.1 C  SpO2: 100% 100%    Last Pain:  Vitals:   12/05/18 1015  TempSrc: Oral  PainSc: 0-No pain                 Brittinee Risk

## 2018-12-05 NOTE — Anesthesia Preprocedure Evaluation (Signed)
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  Reproductive/Obstetrics                             Anesthesia Physical Anesthesia Plan  ASA: IV  Anesthesia Plan: MAC   Post-op Pain Management:    Induction:   PONV Risk Score and Plan:   Airway Management Planned:   Additional Equipment:   Intra-op Plan:   Post-operative Plan:   Informed Consent: I have reviewed the patients History and Physical, chart, labs and discussed the procedure including the risks, benefits and alternatives for the proposed anesthesia with the patient or authorized representative who has indicated his/her understanding and acceptance.       Plan Discussed with: Anesthesiologist  Anesthesia Plan Comments:         Anesthesia Quick Evaluation

## 2018-12-05 NOTE — Transfer of Care (Signed)
Immediate Anesthesia Transfer of Care Note  Patient: Cole Stewart  Procedure(s) Performed: COLONOSCOPY WITH PROPOFOL (N/A ) POLYPECTOMY  Patient Location: PACU  Anesthesia Type:MAC  Level of Consciousness: drowsy  Airway & Oxygen Therapy: Patient Spontanous Breathing  Post-op Assessment: Report given to RN and Post -op Vital signs reviewed and stable  Post vital signs: Reviewed and stable  Last Vitals:  Vitals Value Taken Time  BP 94/52 12/05/2018  9:48 AM  Temp 37.1 C 12/05/2018  9:48 AM  Pulse 80 12/05/2018  9:51 AM  Resp 20 12/05/2018  9:51 AM  SpO2 100 % 12/05/2018  9:51 AM  Vitals shown include unvalidated device data.  Last Pain:  Vitals:   12/05/18 0859  TempSrc: Oral      Patients Stated Pain Goal: (unable to get answer) (76/16/07 3710)  Complications: No apparent anesthesia complications

## 2018-12-05 NOTE — H&P (Signed)
@LOGO @   Primary Care Physician:  Monico Blitz, MD Primary Gastroenterologist:  Dr. Gala Romney  Pre-Procedure History & Physical: HPI:  Cole Stewart is a 43 y.o. male here for colonoscopy for lower abdominal pain and chronic diarrhea.  CT negative for any significant GI pathology concern.  Past Medical History:  Diagnosis Date  . Anemia   . GERD (gastroesophageal reflux disease)   . Mental retardation   . Mood disorder ([place])   . MVP (mitral valve prolapse)   . Recurrent abdominal pain   . Seizures ([place])    last seizure was 2010.Unknown etiology  . Thrombocytopenia ([place])     Past Surgical History:  Procedure Laterality Date  . CHOLECYSTECTOMY      Prior to Admission medications   Medication Sig Start Date End Date Taking? Authorizing Provider  chlorhexidine (PERIDEX) 0.12 % solution Use as directed 15 mLs in the mouth or throat 2 (two) times daily.    Yes [provider]  clonazePAM (KLONOPIN) 0.5 MG tablet Take 0.25-0.5 mg by mouth See admin instructions. Take 0.25 mg by mouth in the morning. Take 0.5 mg by mouth at noon and take 0.5 mg by mouth at bedtime.   Yes [provider]  DEXILANT 60 MG capsule TAKE 1 CAPSULE BY MOUTH ONCE A DAY. Patient taking differently: Take 60 mg by mouth daily.  03/11/18  Yes Annitta Needs, NP  divalproex (DEPAKOTE) 250 MG DR tablet Take 250-500 mg by mouth See admin instructions. Take 250 mg by mouth at 0700 and take 250 mg by mouth at 1200. Take 500 mg by mouth at bedtime 2000.   Yes [provider]  doxepin (SINEQUAN) 10 MG capsule Take 10 mg by mouth at bedtime.   Yes [provider]  gabapentin (NEURONTIN) 400 MG capsule Take 400-1,200 mg by mouth See admin instructions. Take 400 mg by mouth in the morning and take 1200 mg by mouth at bedtime   Yes [provider]  Iloperidone (FANAPT) 6 MG TABS Take 6 mg by mouth daily.    Yes [provider]  iron polysaccharides (NIFEREX) 150 MG capsule  Take 150 mg by mouth 2 (two) times daily.    Yes [provider]  ketoconazole (NIZORAL) 2 % shampoo Apply 1 application topically 2 (two) times a week. *Lather, leave for 5 minutes, then rinse*   Yes [provider]  Na Sulfate-K Sulfate-Mg Sulf (SUPREP BOWEL PREP KIT) 17.5-3.13-1.6 GM/177ML SOLN Take 1 kit by mouth as directed. 10/28/18  Yes Larenz Frasier, Cristopher Estimable, MD  Olopatadine HCl (PATADAY) 0.2 % SOLN Place 1 drop into both eyes daily.    Yes [provider]  Omega-3 Fatty Acids (FISH OIL OMEGA-3) 1000 MG CAPS Take 2,000 mg by mouth daily.   Yes [provider]  QUEtiapine (SEROQUEL XR) 400 MG 24 hr tablet Take 400 mg by mouth See admin instructions. Take 400 mg by mouth daily at 1600 on an empty stomach   Yes [provider]  Vitamin D, Ergocalciferol, (DRISDOL) 50000 UNITS CAPS Take 50,000 Units by mouth every 30 (thirty) days.   Yes [provider]  vitamin E 200 UNIT capsule Take 200 Units by mouth every morning.   Yes [provider]  zolpidem (AMBIEN) 5 MG tablet Take 5 mg by mouth at bedtime.    Yes [provider]    Allergies as of 10/28/2018  . (No Known Allergies)    History reviewed. No pertinent family history.  Social  History   Socioeconomic History  . Marital status: Single    Spouse name: Not on file  . Number of children: Not on file  . Years of education: Not on file  . Highest education level: Not on file  Occupational History  . Not on file  Social Needs  . Financial resource strain: Not on file  . Food insecurity:    Worry: Not on file    Inability: Not on file  . Transportation needs:    Medical: Not on file    Non-medical: Not on file  Tobacco Use  . Smoking status: Never Smoker  . Smokeless tobacco: Never Used  Substance and Sexual Activity  . Alcohol use: No  . Drug use: No  . Sexual activity: Never  Lifestyle  . Physical activity:    Days per week: Not on file    Minutes per  session: Not on file  . Stress: Not on file  Relationships  . Social connections:    Talks on phone: Not on file    Gets together: Not on file    Attends religious service: Not on file    Active member of club or organization: Not on file    Attends meetings of clubs or organizations: Not on file    Relationship status: Not on file  . Intimate partner violence:    Fear of current or ex partner: Not on file    Emotionally abused: Not on file    Physically abused: Not on file    Forced sexual activity: Not on file  Other Topics Concern  . Not on file  Social History Narrative  . Not on file    Review of Systems: See HPI, otherwise negative ROS  Physical Exam: There were no vitals taken for this visit. General:   Alert,   pleasant and cooperative in NAD Neck:  Supple; no masses or thyromegaly. No significant cervical adenopathy. Lungs:  Clear throughout to auscultation.   No wheezes, crackles, or rhonchi. No acute distress. Heart:  Regular rate and rhythm; no murmurs, clicks, rubs,  or gallops. Abdomen: Non-distended, normal bowel sounds.  Soft and nontender without appreciable mass or hepatosplenomegaly.  Pulses:  Normal pulses noted. Extremities:  Without clubbing or edema.  Impression/Plan: Chronic abdominal pain.  Chronic diarrhea.  Colonoscopy to further evaluate.  The risks, benefits, limitations, alternatives and imponderables have been reviewed with the patient. Questions have been answered. All parties are agreeable.      Notice: This dictation was prepared with Dragon dictation along with smaller phrase technology. Any transcriptional errors that result from this process are unintentional and may not be corrected upon review.

## 2018-12-05 NOTE — Op Note (Signed)
[facility] Patient Name: Cole Stewart Procedure Date: 12/05/2018 9:18 AM MRN: [id number] Date of Birth: 1976-10-22 Attending MD: Norvel Richards , MD CSN: [id number] Age: 43 Admit Type: Outpatient Procedure:                Colonoscopy Indications:              Abdominal pain Providers:                Norvel Richards, MD, Jeanann Lewandowsky. Sharon Seller, RN,                            Gerome Sam, RN Referring MD:              Medicines:                Propofol per Anesthesia Complications:            No immediate complications. ` Estimated Blood Loss:     Estimated blood loss was minimal. Procedure:                Pre-Anesthesia Assessment:                           - Prior to the procedure, a History and Physical                            was performed, and patient medications and                            allergies were reviewed. The patient's tolerance of                            previous anesthesia was also reviewed. The risks                            and benefits of the procedure and the sedation                            options and risks were discussed with the patient.                            All questions were answered, and informed consent                            was obtained. Prior Anticoagulants: The patient has                            taken no previous anticoagulant or antiplatelet                            agents. ASA Grade Assessment: II - A patient with                            mild systemic disease. After reviewing the risks  and benefits, the patient was deemed in                            satisfactory condition to undergo the procedure.                           After obtaining informed consent, the colonoscope                            was passed under direct vision. Throughout the                            procedure, the patient's blood pressure, pulse, and                            oxygen saturations  were monitored continuously. The                            CF-[id number] ([id number]) scope was introduced through                            the and advanced to the 5 cm into the ileum. The                            colonoscopy was performed without difficulty. The                            patient tolerated the procedure well. The quality                            of the bowel preparation was adequate. Scope In: 9:28:06 AM Scope Out: 9:40:56 AM Scope Withdrawal Time: 0 hours 7 minutes 33 seconds  Total Procedure Duration: 0 hours 12 minutes 50 seconds  Findings:      The perianal and digital rectal examinations were normal.      A 5 mm polyp was found in the cecum. The polyp was removed with a cold       snare. Resection and retrieval were complete. Estimated blood loss was       minimal.      The exam was otherwise without abnormality on direct and retroflexion       views. Normal distal 5 cm of terminal ileum. Impression:               - One 5 mm polyp in the cecum, removed with a cold                            snare. Resected and retrieved.                           - The examination was otherwise normal on direct                            and retroflexion views. Moderate Sedation:      Moderate (conscious) sedation was personally administered by an  anesthesia professional. The following parameters were monitored: oxygen       saturation, heart rate, blood pressure, respiratory rate, EKG, adequacy       of pulmonary ventilation, and response to care. Recommendation:           - Patient has a contact number available for                            emergencies. The signs and symptoms of potential                            delayed complications were discussed with the                            patient. Return to normal activities tomorrow.                            Written discharge instructions were provided to the                            patient.                            - Advance diet as tolerated.                           - Repeat colonoscopy date to be determined after                            pending pathology results are reviewed for                            surveillance based on pathology results.                           - Return to GI office in 3 months. Procedure Code(s):        --- Professional ---                           [id number], Colonoscopy, flexible; with removal of                            tumor(s), polyp(s), or other lesion(s) by snare                            technique Diagnosis Code(s):        --- Professional ---                           D12.0, Benign neoplasm of cecum                           R10.9, Unspecified abdominal pain CPT copyright 2018 [employer] Association. All rights reserved. The codes documented in this report are preliminary and upon coder review may  be revised to meet current compliance requirements. Cristopher Estimable. Gala Romney, MD Norvel Richards, MD 12/05/2018  9:55:02 AM This report has been signed electronically. Number of Addenda: 0

## 2018-12-05 NOTE — Discharge Instructions (Signed)
Colonoscopy Discharge Instructions  Read the instructions outlined below and refer to this sheet in the next few weeks. These discharge instructions provide you with general information on caring for yourself after you leave the hospital. Your doctor may also give you specific instructions. While your treatment has been planned according to the most current medical practices available, unavoidable complications occasionally occur. If you have any problems or questions after discharge, call Dr. Gala Romney at [phone]. ACTIVITY  You may resume your regular activity, but move at a slower pace for the next 24 hours.   Take frequent rest periods for the next 24 hours.   Walking will help get rid of the air and reduce the bloated feeling in your belly (abdomen).   No driving for 24 hours (because of the medicine (anesthesia) used during the test).    Do not sign any important legal documents or operate any machinery for 24 hours (because of the anesthesia used during the test).  NUTRITION  Drink plenty of fluids.   You may resume your normal diet as instructed by your doctor.   Begin with a light meal and progress to your normal diet. Heavy or fried foods are harder to digest and may make you feel sick to your stomach (nauseated).   Avoid alcoholic beverages for 24 hours or as instructed.  MEDICATIONS  You may resume your normal medications unless your doctor tells you otherwise.  WHAT YOU CAN EXPECT TODAY  Some feelings of bloating in the abdomen.   Passage of more gas than usual.   Spotting of blood in your stool or on the toilet paper.  IF YOU HAD POLYPS REMOVED DURING THE COLONOSCOPY:  No aspirin products for 7 days or as instructed.   No alcohol for 7 days or as instructed.   Eat a soft diet for the next 24 hours.  FINDING OUT THE RESULTS OF YOUR TEST Not all test results are available during your visit. If your test results are not back during the visit, make an appointment  with your caregiver to find out the results. Do not assume everything is normal if you have not heard from your caregiver or the medical facility. It is important for you to follow up on all of your test results.  SEEK IMMEDIATE MEDICAL ATTENTION IF:  You have more than a spotting of blood in your stool.   Your belly is swollen (abdominal distention).   You are nauseated or vomiting.   You have a temperature over 101.   You have abdominal pain or discomfort that is severe or gets worse throughout the day.    Colon polyp information provided  Further recommendations to follow pending review of pathology report     Colon Polyps  Polyps are tissue growths inside the body. Polyps can grow in many places, including the large intestine (colon). A polyp may be a round bump or a mushroom-shaped growth. You could have one polyp or several. Most colon polyps are noncancerous (benign). However, some colon polyps can become cancerous over time. Finding and removing the polyps early can help prevent this. What are the causes? The exact cause of colon polyps is not known. What increases the risk? You are more likely to develop this condition if you:  Have a family history of colon cancer or colon polyps.  Are older than 40 or older than 45 if you are African American.  Have inflammatory bowel disease, such as ulcerative colitis or Crohn's disease.  Have certain hereditary  conditions, such as: ? Familial adenomatous polyposis. ? Lynch syndrome. ? Turcot syndrome. ? Peutz-Jeghers syndrome.  Are overweight.  Smoke cigarettes.  Do not get enough exercise.  Drink too much alcohol.  Eat a diet that is high in fat and red meat and low in fiber.  Had childhood cancer that was treated with abdominal radiation. What are the signs or symptoms? Most polyps do not cause symptoms. If you have symptoms, they may include:  Blood coming from your rectum when having a bowel  movement.  Blood in your stool. The stool may look dark red or black.  Abdominal pain.  A change in bowel habits, such as constipation or diarrhea. How is this diagnosed? This condition is diagnosed with a colonoscopy. This is a procedure in which a lighted, flexible scope is inserted into the anus and then passed into the colon to examine the area. Polyps are sometimes found when a colonoscopy is done as part of routine cancer screening tests. How is this treated? Treatment for this condition involves removing any polyps that are found. Most polyps can be removed during a colonoscopy. Those polyps will then be tested for cancer. Additional treatment may be needed depending on the results of testing. Follow these instructions at home: Lifestyle  Maintain a healthy weight, or lose weight if recommended by your health care provider.  Exercise every day or as told by your health care provider.  Do not use any products that contain nicotine or tobacco, such as cigarettes and e-cigarettes. If you need help quitting, ask your health care provider.  If you drink alcohol, limit how much you have: ? 0-1 drink a day for women. ? 0-2 drinks a day for men.  Be aware of how much alcohol is in your drink. In the [place] one drink equals one 12 oz bottle of beer (355 mL), one 5 oz glass of wine (148 mL), or one 1 oz shot of hard liquor (44 mL). Eating and drinking   Eat foods that are high in fiber, such as fruits, vegetables, and whole grains.  Eat foods that are high in calcium and vitamin D, such as milk, cheese, yogurt, eggs, liver, fish, and broccoli.  Limit foods that are high in fat, such as fried foods and desserts.  Limit the amount of red meat and processed meat you eat, such as hot dogs, sausage, bacon, and lunch meats. General instructions  Keep all follow-up visits as told by your health care provider. This is important. ? This includes having regularly scheduled  colonoscopies. ? Talk to your health care provider about when you need a colonoscopy. Contact a health care provider if:  You have new or worsening bleeding during a bowel movement.  You have new or increased blood in your stool.  You have a change in bowel habits.  You lose weight for no known reason. Summary  Polyps are tissue growths inside the body. Polyps can grow in many places, including the colon.  Most colon polyps are noncancerous (benign), but some can become cancerous over time.  This condition is diagnosed with a colonoscopy.  Treatment for this condition involves removing any polyps that are found. Most polyps can be removed during a colonoscopy. This information is not intended to replace advice given to you by your health care provider. Make sure you discuss any questions you have with your health care provider. Document Released: 07/19/2004 Document Revised: 02/07/2018 Document Reviewed: 02/07/2018 Elsevier Interactive Patient Education  2019 [employer].  Monitored Anesthesia Care, Care After These instructions provide you with information about caring for yourself after your procedure. Your health care provider may also give you more specific instructions. Your treatment has been planned according to current medical practices, but problems sometimes occur. Call your health care provider if you have any problems or questions after your procedure. What can I expect after the procedure? After your procedure, you may:  Feel sleepy for several hours.  Feel clumsy and have poor balance for several hours.  Feel forgetful about what happened after the procedure.  Have poor judgment for several hours.  Feel nauseous or vomit.  Have a sore throat if you had a breathing tube during the procedure. Follow these instructions at home: For at least 24 hours after the procedure:      Have a responsible adult stay with you. It is important to have someone help  care for you until you are awake and alert.  Rest as needed.  Do not: ? Participate in activities in which you could fall or become injured. ? Drive. ? Use heavy machinery. ? Drink alcohol. ? Take sleeping pills or medicines that cause drowsiness. ? Make important decisions or sign legal documents. ? Take care of children on your own. Eating and drinking  Follow the diet that is recommended by your health care provider.  If you vomit, drink water, juice, or soup when you can drink without vomiting.  Make sure you have little or no nausea before eating solid foods. General instructions  Take over-the-counter and prescription medicines only as told by your health care provider.  If you have sleep apnea, surgery and certain medicines can increase your risk for breathing problems. Follow instructions from your health care provider about wearing your sleep device: ? Anytime you are sleeping, including during daytime naps. ? While taking prescription pain medicines, sleeping medicines, or medicines that make you drowsy.  If you smoke, do not smoke without supervision.  Keep all follow-up visits as told by your health care provider. This is important. Contact a health care provider if:  You keep feeling nauseous or you keep vomiting.  You feel light-headed.  You develop a rash.  You have a fever. Get help right away if:  You have trouble breathing. Summary  For several hours after your procedure, you may feel sleepy and have poor judgment.  Have a responsible adult stay with you for at least 24 hours or until you are awake and alert. This information is not intended to replace advice given to you by your health care provider. Make sure you discuss any questions you have with your health care provider. Document Released: 02/13/2016 Document Revised: 06/08/2017 Document Reviewed: 02/13/2016 Elsevier Interactive Patient Education  2019 [employer].

## 2018-12-05 NOTE — Anesthesia Procedure Notes (Signed)
Procedure Name: MAC Date/Time: 12/05/2018 9:14 AM Performed by: Andree Elk Amy A, CRNA Pre-anesthesia Checklist: Patient identified, Emergency Drugs available, Suction available, Timeout performed and Patient being monitored Patient Re-evaluated:Patient Re-evaluated prior to induction Oxygen Delivery Method: Non-rebreather mask

## 2018-12-08 ENCOUNTER — Encounter: Payer: Self-pay | Admitting: Internal Medicine

## 2018-12-10 ENCOUNTER — Encounter (HOSPITAL_COMMUNITY): Payer: Self-pay | Admitting: Internal Medicine

## 2019-01-21 ENCOUNTER — Ambulatory Visit: Payer: Medicare Other | Admitting: Nurse Practitioner

## 2019-01-21 ENCOUNTER — Telehealth: Payer: Self-pay | Admitting: Internal Medicine

## 2019-01-21 ENCOUNTER — Encounter: Payer: Self-pay | Admitting: Internal Medicine

## 2019-01-21 NOTE — Telephone Encounter (Signed)
PATIENT WAS A NO SHOW AND LETTER SENT  °

## 2019-01-21 NOTE — Progress Notes (Deleted)
Referring Provider: Monico Blitz, MD Primary Care Physician:  Monico Blitz, MD Primary GI:  Dr. Gala Romney  No chief complaint on file.   HPI:   Cole Stewart is a 43 y.o. male who presents for follow-up on abdominal pain and diarrhea.  The patient was last seen in our office 10/14/2018 for epigastric pain, diarrhea, weight loss.  Noted chronic history of GERD, EGD in 2001 with documented antral ulcers.  The patient is noncommunicative and typically accompanied by caregiver to his appointments.  At his last visit his abdominal pain was noted to be likely more frequent, loose stools about 7 a day.  Unable to collect stool sample previously because he would not do that.  Difficult to manage in the restroom.  Gluten-free diet did not help, generally does not consume much dairy although ice cream does upset his stomach.  Objectively down 13 pounds in 9 months.  No other GI complaints.  Recommended CT scan, stool studies, consider colonoscopy.  Labs are completed 10/23/2018 which showed persistent chronic anemia stable/improved from baseline with indices indicative of possible iron deficiency anemia.  Lipase normal, CMP essentially normal.  Colonoscopy completed 12/05/2018 which found a 5 mm cecal polyp, otherwise normal.  Surgical pathology found the polyp to be tubular adenoma.  Recommended 5-year repeat colonoscopy.  Today he is accompanied by a staff member.  Today they state   Past Medical History:  Diagnosis Date   Anemia    GERD (gastroesophageal reflux disease)    Mental retardation    Mood disorder ([place])    MVP (mitral valve prolapse)    Recurrent abdominal pain    Seizures ([place])    last seizure was 2010.Unknown etiology   Thrombocytopenia ([place])     Past Surgical History:  Procedure Laterality Date   CHOLECYSTECTOMY     COLONOSCOPY WITH PROPOFOL N/A 12/05/2018   Procedure: COLONOSCOPY WITH PROPOFOL;  Surgeon: Daneil Dolin, MD;  [facility];  Service:  Endoscopy;  Laterality: N/A;  10:15am   POLYPECTOMY  12/05/2018   Procedure: POLYPECTOMY;  Surgeon: Daneil Dolin, MD;  [facility];  Service: Endoscopy;;  colon    Current Outpatient Medications  Medication Sig Dispense Refill   chlorhexidine (PERIDEX) 0.12 % solution Use as directed 15 mLs in the mouth or throat 2 (two) times daily.      clonazePAM (KLONOPIN) 0.5 MG tablet Take 0.25-0.5 mg by mouth See admin instructions. Take 0.25 mg by mouth in the morning. Take 0.5 mg by mouth at noon and take 0.5 mg by mouth at bedtime.     DEXILANT 60 MG capsule TAKE 1 CAPSULE BY MOUTH ONCE A DAY. (Patient taking differently: Take 60 mg by mouth daily. ) 30 capsule 11   divalproex (DEPAKOTE) 250 MG DR tablet Take 250-500 mg by mouth See admin instructions. Take 250 mg by mouth at 0700 and take 250 mg by mouth at 1200. Take 500 mg by mouth at bedtime 2000.     doxepin (SINEQUAN) 10 MG capsule Take 10 mg by mouth at bedtime.     gabapentin (NEURONTIN) 400 MG capsule Take 400-1,200 mg by mouth See admin instructions. Take 400 mg by mouth in the morning and take 1200 mg by mouth at bedtime     Iloperidone (FANAPT) 6 MG TABS Take 6 mg by mouth daily.      iron polysaccharides (NIFEREX) 150 MG capsule Take 150 mg by mouth 2 (two) times daily.  ketoconazole (NIZORAL) 2 % shampoo Apply 1 application topically 2 (two) times a week. *Lather, leave for 5 minutes, then rinse*     Na Sulfate-K Sulfate-Mg Sulf (SUPREP BOWEL PREP KIT) 17.5-3.13-1.6 GM/177ML SOLN Take 1 kit by mouth as directed. 1 Bottle 0   Olopatadine HCl (PATADAY) 0.2 % SOLN Place 1 drop into both eyes daily.      Omega-3 Fatty Acids (FISH OIL OMEGA-3) 1000 MG CAPS Take 2,000 mg by mouth daily.     QUEtiapine (SEROQUEL XR) 400 MG 24 hr tablet Take 400 mg by mouth See admin instructions. Take 400 mg by mouth daily at 1600 on an empty stomach     Vitamin D, Ergocalciferol, (DRISDOL) 50000 UNITS CAPS Take 50,000 Units by  mouth every 30 (thirty) days.     vitamin E 200 UNIT capsule Take 200 Units by mouth every morning.     zolpidem (AMBIEN) 5 MG tablet Take 5 mg by mouth at bedtime.      No current facility-administered medications for this visit.     Allergies as of 01/21/2019   (No Known Allergies)    No family history on file.  Social History   Socioeconomic History   Marital status: Single    Spouse name: Not on file   Number of children: Not on file   Years of education: Not on file   Highest education level: Not on file  Occupational History   Not on file  Social Needs   Financial resource strain: Not on file   Food insecurity:    Worry: Not on file    Inability: Not on file   Transportation needs:    Medical: Not on file    Non-medical: Not on file  Tobacco Use   Smoking status: Never Smoker   Smokeless tobacco: Never Used  Substance and Sexual Activity   Alcohol use: No   Drug use: No   Sexual activity: Never  Lifestyle   Physical activity:    Days per week: Not on file    Minutes per session: Not on file   Stress: Not on file  Relationships   Social connections:    Talks on phone: Not on file    Gets together: Not on file    Attends religious service: Not on file    Active member of club or organization: Not on file    Attends meetings of clubs or organizations: Not on file    Relationship status: Not on file  Other Topics Concern   Not on file  Social History Narrative   Not on file    Review of Systems: General: Negative for anorexia, weight loss, fever, chills, fatigue, weakness. Eyes: Negative for vision changes.  ENT: Negative for hoarseness, difficulty swallowing , nasal congestion. CV: Negative for chest pain, angina, palpitations, dyspnea on exertion, peripheral edema.  Respiratory: Negative for dyspnea at rest, dyspnea on exertion, cough, sputum, wheezing.  GI: See history of present illness. GU:  Negative for dysuria, hematuria,  urinary incontinence, urinary frequency, nocturnal urination.  MS: Negative for joint pain, low back pain.  Derm: Negative for rash or itching.  Neuro: Negative for weakness, abnormal sensation, seizure, frequent headaches, memory loss, confusion.  Psych: Negative for anxiety, depression, suicidal ideation, hallucinations.  Endo: Negative for unusual weight change.  Heme: Negative for bruising or bleeding. Allergy: Negative for rash or hives.   Physical Exam: There were no vitals taken for this visit. General:   Alert and oriented. Pleasant and cooperative.  Well-nourished and well-developed.  Head:  Normocephalic and atraumatic. Eyes:  Without icterus, sclera clear and conjunctiva pink.  Ears:  Normal auditory acuity. Mouth:  No deformity or lesions, oral mucosa pink.  Throat/Neck:  Supple, without mass or thyromegaly. Cardiovascular:  S1, S2 present without murmurs appreciated. Normal pulses noted. Extremities without clubbing or edema. Respiratory:  Clear to auscultation bilaterally. No wheezes, rales, or rhonchi. No distress.  Gastrointestinal:  +BS, soft, non-tender and non-distended. No HSM noted. No guarding or rebound. No masses appreciated.  Rectal:  Deferred  Musculoskalatal:  Symmetrical without gross deformities. Normal posture. Skin:  Intact without significant lesions or rashes. Neurologic:  Alert and oriented x4;  grossly normal neurologically. Psych:  Alert and cooperative. Normal mood and affect. Heme/Lymph/Immune: No significant cervical adenopathy. No excessive bruising noted.    01/21/2019 8:03 AM   Disclaimer: This note was dictated with voice recognition software. Similar sounding words can inadvertently be transcribed and may not be corrected upon review.

## 2019-01-28 DIAGNOSIS — F419 Anxiety disorder, unspecified: Secondary | ICD-10-CM | POA: Diagnosis not present

## 2019-02-26 ENCOUNTER — Other Ambulatory Visit: Payer: Self-pay | Admitting: Nurse Practitioner

## 2019-03-03 ENCOUNTER — Other Ambulatory Visit: Payer: Self-pay | Admitting: Gastroenterology

## 2019-03-03 DIAGNOSIS — K219 Gastro-esophageal reflux disease without esophagitis: Secondary | ICD-10-CM

## 2019-03-03 DIAGNOSIS — R1013 Epigastric pain: Secondary | ICD-10-CM

## 2019-03-11 DIAGNOSIS — Z713 Dietary counseling and surveillance: Secondary | ICD-10-CM | POA: Diagnosis not present

## 2019-03-11 DIAGNOSIS — R569 Unspecified convulsions: Secondary | ICD-10-CM | POA: Diagnosis not present

## 2019-03-11 DIAGNOSIS — Z299 Encounter for prophylactic measures, unspecified: Secondary | ICD-10-CM | POA: Diagnosis not present

## 2019-03-11 DIAGNOSIS — I10 Essential (primary) hypertension: Secondary | ICD-10-CM | POA: Diagnosis not present

## 2019-03-11 DIAGNOSIS — Z6822 Body mass index (BMI) 22.0-22.9, adult: Secondary | ICD-10-CM | POA: Diagnosis not present

## 2019-04-24 ENCOUNTER — Ambulatory Visit: Payer: Medicare Other | Admitting: Nurse Practitioner

## 2019-04-28 NOTE — Progress Notes (Signed)
Referring Provider: Monico Blitz, MD Primary Care Physician:  Monico Blitz, MD  Primary GI: Dr. Gala Romney  Chief Complaint  Patient presents with   Follow-up    abd pain,diarrhea much better,f/u from procedure    HPI:   Cole Stewart is a 43 y.o. male who is mentally challenged with a GI history significant for cholecystectomy, GERD , abdominal pain, and diarrhea. Also with history of anemia and thrombocytopenia. Other history below. Patient presenting today for follow up after colonoscopy.   Last seen in our office on 10/14/18 for ongoing diarrhea with frequent stooling,  abdominal pain, and weight loss. Stool studies had not been collected due to patient not allowing for this. Gluten free diet had been tried without improvement. Has been tried on Bentyl 10mg  up to 4 times daily without significant improvement in diarrhea. At this visit, was planning for colonoscopy, CT abdomen and pelvis, labs, and repeat attempt for stool samples, and trial of lactose free.  Labs 10/23/18 with persistent chronic anemia and thrombocytopenia. Lipase normal. Liver function, kidney function and electrolytes normal. Stool studies not completed.  CT Abdomen and Pelvis with Contrast 10/24/18 without any acute GI abnormalities. Hepatic steatosis noted.  Colonoscopy 1/30/220 with one 90mm polyp in cecum, otherwise normal. Pathology revealed tubular adenoma. Repeat in 5 years.   Today patient is accompanied by a caregiver who provides most of the history. Diarrhea much improved. Not happened in about 2-3 months. Now with soft formed BMs daily. Staff has increased fruits and vegetables, fiber drinks, and decrease bread. Continues to have milk with cereal, otherwise dairy is limited. No ice cream. Ice cream will make diarrhea worse. Only taking Bentyl when his stomach is hurting. Unclear if this is really helping. No hematochochezia, reports history of black stools associated with diarrhea. Last black stool 2 months  ago.   Epigastric pain pain every other day. Patient doesn't want to do anything when his stomach is hurting. Patient states he feels nauseated at times with abdominal pain. No vomiting. No dysphagia. Pain is more often in the morning when he wakes up. Not particularly worsened by meals/specific foods. Continues with weight loss, but appetite is good. Will eat 3 meals a day and between meals. Staff states he is very active. Walks daily and exercises in house every day.  No NSAIDs. Staff states he has another antacid mediacation on list as PRN, but not sure what this is.   Patient also has a long history of anemia. He is on iron at this time. When asking staff about this, they said he is not followed by anyone regularly, and they do not remember this being address specifically in the past.   Denies fever, chills, light headedness, dizziness, syncope.   Past Medical History:  Diagnosis Date   Anemia    GERD (gastroesophageal reflux disease)    Mental retardation    Mood disorder ([place])    MVP (mitral valve prolapse)    Recurrent abdominal pain    Seizures ([name])    last seizure was 2010.Unknown etiology   Thrombocytopenia ([name])     Past Surgical History:  Procedure Laterality Date   CHOLECYSTECTOMY     COLONOSCOPY WITH PROPOFOL N/A 12/05/2018   Procedure: COLONOSCOPY WITH PROPOFOL;  Surgeon: Daneil Dolin, MD;  [facility];  Service: Endoscopy;  Laterality: N/A;  10:15am   POLYPECTOMY  12/05/2018   Procedure: POLYPECTOMY;  Surgeon: Daneil Dolin, MD;  [facility];  Service: Endoscopy;;  colon    Current Outpatient Medications  Medication Sig Dispense Refill   chlorhexidine (PERIDEX) 0.12 % solution Use as directed 15 mLs in the mouth or throat 2 (two) times daily.      clonazePAM (KLONOPIN) 0.5 MG tablet Take 0.25-0.5 mg by mouth See admin instructions. Take 0.25 mg by mouth in the morning. Take 0.5 mg by mouth at noon and take 0.5 mg by mouth at  bedtime.     DEXILANT 60 MG capsule TAKE 1 CAPSULE BY MOUTH ONCE A DAY. 30 capsule 11   dicyclomine (BENTYL) 10 MG capsule TAKE 1 CAPSULE FOUR TIMES DAILY AS NEEDED FOR SPASMS. 90 capsule 11   divalproex (DEPAKOTE) 250 MG DR tablet Take 250-500 mg by mouth See admin instructions. Take 250 mg by mouth at 0700 and take 250 mg by mouth at 1200. Take 500 mg by mouth at bedtime 2000.     doxepin (SINEQUAN) 10 MG capsule Take 10 mg by mouth at bedtime.     gabapentin (NEURONTIN) 400 MG capsule Take 400-1,200 mg by mouth See admin instructions. Take 400 mg by mouth in the morning and take 1200 mg by mouth at bedtime     Iloperidone (FANAPT) 6 MG TABS Take 6 mg by mouth daily.      iron polysaccharides (NIFEREX) 150 MG capsule Take 150 mg by mouth 2 (two) times daily.      ketoconazole (NIZORAL) 2 % shampoo Apply 1 application topically 2 (two) times a week. *Lather, leave for 5 minutes, then rinse*     Olopatadine HCl (PATADAY) 0.2 % SOLN Place 1 drop into both eyes daily.      Omega-3 Fatty Acids (FISH OIL OMEGA-3) 1000 MG CAPS Take 2,000 mg by mouth daily.     QUEtiapine (SEROQUEL XR) 400 MG 24 hr tablet Take 400 mg by mouth See admin instructions. Take 400 mg by mouth daily at 1600 on an empty stomach     Vitamin D, Ergocalciferol, (DRISDOL) 50000 UNITS CAPS Take 50,000 Units by mouth every 30 (thirty) days.     vitamin E 200 UNIT capsule Take 200 Units by mouth every morning.     zolpidem (AMBIEN) 5 MG tablet Take 5 mg by mouth at bedtime.      famotidine (PEPCID) 20 MG tablet Take 1 tablet (20 mg total) by mouth at bedtime. 30 tablet 3   No current facility-administered medications for this visit.     Allergies as of 04/29/2019   (No Known Allergies)    No family history on file.  Social History   Socioeconomic History   Marital status: Single    Spouse name: Not on file   Number of children: Not on file   Years of education: Not on file   Highest education level:  Not on file  Occupational History   Not on file  Social Needs   Financial resource strain: Not on file   Food insecurity    Worry: Not on file    Inability: Not on file   Transportation needs    Medical: Not on file    Non-medical: Not on file  Tobacco Use   Smoking status: Never Smoker   Smokeless tobacco: Never Used  Substance and Sexual Activity   Alcohol use: No   Drug use: No   Sexual activity: Never  Lifestyle   Physical activity    Days per week: Not on file    Minutes per session: Not on file   Stress: Not  on file  Relationships   Social connections    Talks on phone: Not on file    Gets together: Not on file    Attends religious service: Not on file    Active member of club or organization: Not on file    Attends meetings of clubs or organizations: Not on file    Relationship status: Not on file  Other Topics Concern   Not on file  Social History Narrative   Not on file    Review of Systems: Gen: Denies fatigue, weakness. CV: Denies chest pain, palpitations, peripheral edema Resp: Denies dyspnea at rest, cough GI: See HPI Derm: Denies rash, itching, dry skin Heme: Denies bruising, bleeding  Physical Exam: BP 129/83    Pulse 70    Temp (!) 97 F (36.1 C)    Ht 5\' 6"  (1.676 m)    Wt 147 lb 12.8 oz (67 kg)    BMI 23.86 kg/m  General:   Alert and oriented. No distress noted. Pleasant and cooperative.  Head:  Normocephalic and atraumatic. Eyes:  Conjuctiva clear without scleral icterus. Heart:  S1, S2 present without murmurs appreciated. Lungs:  Clear to auscultation bilaterally. No wheezes, rales, or rhonchi. No distress.  Abdomen:  +BS, soft, non-tender and non-distended. No rebound or guarding. No HSM or masses noted. Msk:  Symmetrical without gross deformities. Normal posture. Extremities:  Without edema. Neurologic:  Alert and  oriented x4 Psych:  Alert and cooperative. Normal mood and affect.

## 2019-04-29 ENCOUNTER — Other Ambulatory Visit: Payer: Self-pay

## 2019-04-29 ENCOUNTER — Telehealth: Payer: Self-pay | Admitting: Gastroenterology

## 2019-04-29 ENCOUNTER — Ambulatory Visit (INDEPENDENT_AMBULATORY_CARE_PROVIDER_SITE_OTHER): Payer: Medicare Other | Admitting: Gastroenterology

## 2019-04-29 ENCOUNTER — Encounter: Payer: Self-pay | Admitting: Gastroenterology

## 2019-04-29 VITALS — BP 129/83 | HR 70 | Temp 97.0°F | Ht 66.0 in | Wt 147.8 lb

## 2019-04-29 DIAGNOSIS — Z862 Personal history of diseases of the blood and blood-forming organs and certain disorders involving the immune mechanism: Secondary | ICD-10-CM

## 2019-04-29 DIAGNOSIS — R1013 Epigastric pain: Secondary | ICD-10-CM

## 2019-04-29 DIAGNOSIS — D649 Anemia, unspecified: Secondary | ICD-10-CM | POA: Diagnosis not present

## 2019-04-29 DIAGNOSIS — R197 Diarrhea, unspecified: Secondary | ICD-10-CM | POA: Diagnosis not present

## 2019-04-29 DIAGNOSIS — K921 Melena: Secondary | ICD-10-CM

## 2019-04-29 MED ORDER — FAMOTIDINE 20 MG PO TABS: 20.0000 mg | ORAL_TABLET | Freq: Every day | ORAL | 3 refills | Status: DC

## 2019-04-29 NOTE — Patient Instructions (Addendum)
1.Proceed with upper endoscopy with Dr. Gala Romney in the near future.  2. Start Pepcid nightly at bedtime. Hopefully this will help decrease morning epigastric pain. We will have further recommendations following procedure.   3. Have CBC and iron studies completed.  4. Follow-up in 2 months.   Aliene Altes, PA-C [facility]

## 2019-04-29 NOTE — Assessment & Plan Note (Addendum)
Patient continues to have ongoing epigastric pain about every other day worse in the morning. He is on Dexilant 60mg  at this time was on prilosec in the past. No appreciable tenderness to palpation on abdominal exam today.  Melena with diarrhea about 2 months ago, however patient is also on iron. Chronic history of anemia, hemoglobin consistent with his baseline in January 2020. Continued weight loss (9lbs since last visit, about  22lbs in last year), but appetite remains good. CT abd/pelvis December 2019 without acute GI abnormalities. Possible remote EGD in 2001 with antral ulcers per office note on 12/05/2017, not found in our system. No NSAIDs.  At this point, will pursue EGD with Dr. Gala Romney in the near future. The risks, benefits, and alternatives have been discussed in detail with patient and staff. They have stated understanding and desire to proceed.  Hold iron 7 days prior to procedure.  Start Pepcid 20mg  nightly at bedtime.  Further recommendations following EGD.  Will also repeat CBC and iron studies at this time.

## 2019-04-29 NOTE — Telephone Encounter (Signed)
Elmo Putt, can you arrange for patient to have CBC and iron studies (iron, TIBC, and Ferritin)? He has a chronic history of anemia. Staff that accompanied the patient stated he is not followed regularly for this.

## 2019-04-29 NOTE — Assessment & Plan Note (Addendum)
Diarrhea has improved. Patient now with soft formed BMs daily. Staff states they are trying to increase the fiber he gets in his food, avoid as much bread as possible, and limit dairy products. He has continued with Bentyl. Colonoscopy 1/30/220 with one 100mm tubular adenoma in cecum, otherwise normal. Suspect diarrhea was related to a food intolerance; however, staff reported when patient would have diarrhea, he also would have melena. Although he is on iron, I can't rule out upper GI component as he has had ongoing epigastric pain.   Continue Bentyl as needed.  Continue to avoid foods that cause worsening diarrhea.  Proceed with EGD with Dr. Gala Romney as described above.

## 2019-04-29 NOTE — Assessment & Plan Note (Addendum)
Patient has chronic history of anemia dating back as far as I can see in the system to 2009. Staff states patient is not followed by anyone regularly for his anemia. Most recent hemoblobin 11.4 on 11/29/18, which seems to be about his baseline. Also with thrombocytopenia dating back to 2014. Iron studies completed last in 2014 were normal. Patient is on iron at this time. Report of melena in the past with diarrhea. None in about 2 months. Not symptomatic from anemia.  ] We are pursuing EGD in the near future with Dr. Gala Romney as described.  Also repeat CBC and iron studies. May consider referral to hematology in the future.

## 2019-04-30 ENCOUNTER — Telehealth: Payer: Self-pay

## 2019-04-30 NOTE — Telephone Encounter (Signed)
Noted  

## 2019-04-30 NOTE — Telephone Encounter (Signed)
Routing to [facility] 

## 2019-04-30 NOTE — Progress Notes (Signed)
CC'D TO PCP °

## 2019-04-30 NOTE — Telephone Encounter (Signed)
Lab orders were mailed to the group home on 6/23/2o. The group home manager is expecting the lab orders and will take pt to complete them.

## 2019-04-30 NOTE — Telephone Encounter (Signed)
[place] advised yesterday for pt to hold Iron for 7 days prior to EGD after instructions were given at [facility].  Pre-op appt 07/21/19 at 12:45pm. Called facility and informed manager of pre-op appt and for pt to hold Iron for 7 days prior to EGD. New procedure instructions mailed with pre-op letter.

## 2019-05-01 DIAGNOSIS — F419 Anxiety disorder, unspecified: Secondary | ICD-10-CM | POA: Diagnosis not present

## 2019-05-12 ENCOUNTER — Ambulatory Visit: Payer: Medicare Other | Admitting: Nurse Practitioner

## 2019-05-20 ENCOUNTER — Other Ambulatory Visit: Payer: Self-pay

## 2019-05-20 ENCOUNTER — Telehealth: Payer: Self-pay

## 2019-05-20 NOTE — Telephone Encounter (Signed)
Called facility manager, EGD w/Propofol w/RMR scheduled for 07/24/19 will need to be moved to later time d/t pt will need rapid COVID test the morning of procedure since he stays at a facility. Procedure time changed to 12:30pm. COVID test appt 07/24/19 at 9:15am. Manager is aware pt will need to wait at short stay after test until procedure, staff member or facility member will need to stay with pt. Pre-op appt, COVID test appt, and procedure instructions mailed. Order entered for rapid COVID test. Endo scheduler aware.

## 2019-05-27 ENCOUNTER — Telehealth: Payer: Self-pay | Admitting: *Deleted

## 2019-05-27 NOTE — Telephone Encounter (Signed)
Called facility where patient resides. Spoke with Engineering geologist. She is aware [facility] will not be able to do procedure 9/17. We have rescheduled patient to 9/3 at 12:15pm. She is also aware patient will need to go for COVID-19 testing same day 9/17 at 9:15am and someone will have to wait with patient after testing in endo until procedure. She asked that I mail instructions and all appts to her [address] as fax machine is not currently working. I have also mailed new pre-op appt as well.

## 2019-06-25 DIAGNOSIS — Z6822 Body mass index (BMI) 22.0-22.9, adult: Secondary | ICD-10-CM | POA: Diagnosis not present

## 2019-06-25 DIAGNOSIS — I10 Essential (primary) hypertension: Secondary | ICD-10-CM | POA: Diagnosis not present

## 2019-06-25 DIAGNOSIS — G40209 Localization-related (focal) (partial) symptomatic epilepsy and epileptic syndromes with complex partial seizures, not intractable, without status epilepticus: Secondary | ICD-10-CM | POA: Diagnosis not present

## 2019-06-25 DIAGNOSIS — F72 Severe intellectual disabilities: Secondary | ICD-10-CM | POA: Diagnosis not present

## 2019-06-25 DIAGNOSIS — Z299 Encounter for prophylactic measures, unspecified: Secondary | ICD-10-CM | POA: Diagnosis not present

## 2019-06-27 ENCOUNTER — Other Ambulatory Visit (HOSPITAL_COMMUNITY)
Admission: RE | Admit: 2019-06-27 | Discharge: 2019-06-27 | Disposition: A | Discharge status: Home / Self Care | Payer: Medicare Other | Source: Ambulatory Visit | Attending: Gastroenterology | Admitting: Gastroenterology

## 2019-06-27 DIAGNOSIS — Z862 Personal history of diseases of the blood and blood-forming organs and certain disorders involving the immune mechanism: Secondary | ICD-10-CM | POA: Insufficient documentation

## 2019-07-01 ENCOUNTER — Telehealth: Payer: Self-pay | Admitting: Gastroenterology

## 2019-07-01 ENCOUNTER — Ambulatory Visit: Payer: Medicare Other | Admitting: Gastroenterology

## 2019-07-01 NOTE — Telephone Encounter (Signed)
Noted  

## 2019-07-01 NOTE — Telephone Encounter (Signed)
Cole Stewart, patient was supposed to have labs completed after our last office visit. It doesn't appear they were completed. Can we follow-up on this?

## 2019-07-01 NOTE — Telephone Encounter (Signed)
FYI Spoke with Cole Stewart at the group home. Pt is going this Thursday to have his lab work done. Pt has gone to the lab twice and they weren't able to get blood from pt. They asked him to drink fluids and return this Thursday.

## 2019-07-03 DIAGNOSIS — Z79899 Other long term (current) drug therapy: Secondary | ICD-10-CM | POA: Diagnosis not present

## 2019-07-03 DIAGNOSIS — I10 Essential (primary) hypertension: Secondary | ICD-10-CM | POA: Diagnosis not present

## 2019-07-03 DIAGNOSIS — Z299 Encounter for prophylactic measures, unspecified: Secondary | ICD-10-CM | POA: Diagnosis not present

## 2019-07-03 DIAGNOSIS — Z0000 Encounter for general adult medical examination without abnormal findings: Secondary | ICD-10-CM | POA: Diagnosis not present

## 2019-07-03 DIAGNOSIS — Z6822 Body mass index (BMI) 22.0-22.9, adult: Secondary | ICD-10-CM | POA: Diagnosis not present

## 2019-07-03 DIAGNOSIS — Z1339 Encounter for screening examination for other mental health and behavioral disorders: Secondary | ICD-10-CM | POA: Diagnosis not present

## 2019-07-03 DIAGNOSIS — D649 Anemia, unspecified: Secondary | ICD-10-CM | POA: Diagnosis not present

## 2019-07-03 DIAGNOSIS — Z862 Personal history of diseases of the blood and blood-forming organs and certain disorders involving the immune mechanism: Secondary | ICD-10-CM | POA: Diagnosis not present

## 2019-07-03 DIAGNOSIS — Z1331 Encounter for screening for depression: Secondary | ICD-10-CM | POA: Diagnosis not present

## 2019-07-07 ENCOUNTER — Other Ambulatory Visit: Payer: Self-pay | Admitting: *Deleted

## 2019-07-07 DIAGNOSIS — D509 Iron deficiency anemia, unspecified: Secondary | ICD-10-CM

## 2019-07-07 NOTE — Progress Notes (Signed)
Received. Hemoglobin remains low, but is stable at 11.8. Platelets 86,000. Iron 143, Iron Saturation 49%, TIBC 292, UIBC 149 (all normal). No Ferritin. Cr 0.64, sodium 140, potassium 4.7, AST 25, ALT 20, alk phos 77, total bili 0.7.  Patient has a chronic history of IDA which appears to be stable. He is scheduled for EGD on 07/10/19 which will help with the evaluation; however, with chronicity of anemia as well as thrombocytopenia, I would like to go ahead and refer patient to hematology.   [facility] clinical pool, can we get patient referred to hematology? Dx: IDA, thrombocytopenia

## 2019-07-07 NOTE — Pre-Procedure Instructions (Signed)
Patient is a nursing home resident. I called Levada Dy [phone], administrator, who states she is aware of his COVID test to be done at 0900 am of procedure as well as NPO status. She states patient's mother, Letroy Deborde is his legal guardian, [phone] and she will not be coming with patient day of procedure. Attempted to call Mom for telephone consent and there was no answer and no answering machine to leave a message for her to call back.

## 2019-07-07 NOTE — Patient Instructions (Signed)
Iron and TIBC results received--WNL.  [facility] was unable to collect sufficient specimen to perform Ferritin, serum, and is providing pt with re-collection instructions.  Result placed in [facility] box.

## 2019-07-07 NOTE — Progress Notes (Signed)
Referral sent 

## 2019-07-08 ENCOUNTER — Other Ambulatory Visit: Payer: Self-pay

## 2019-07-08 ENCOUNTER — Encounter (HOSPITAL_COMMUNITY)
Admission: RE | Admit: 2019-07-08 | Discharge: 2019-07-08 | Disposition: A | Discharge status: Home / Self Care | Payer: Medicare Other | Source: Ambulatory Visit | Attending: Internal Medicine | Admitting: Internal Medicine

## 2019-07-08 NOTE — Patient Instructions (Signed)
Received ferritin, serum result: 1416. Placed in [facility] box.

## 2019-07-08 NOTE — Patient Instructions (Signed)
    Cole Stewart  07/08/2019     @PREFPERIOPPHARMACY @   Your procedure is scheduled on  07/10/2019   Report to [facility] at [place].M.(report to COVID testing site for covid test @0915  07/10/2019. Patient needs to have COVID test done and someone needs to sit with him while we are waiting on results. He will   need to not come in hospital until we call you to come in once COVID test results are back.     Call this number if you have problems the morning of surgery:  [phone]   Remember:   Follow the diet and prep instructions given to you by Dr Nona Dell office.                       Take these medicines the morning of surgery with A SIP OF WATER  Clonazepam, dexilant, depakote, pepcid, gabapentin, Ilopridone.    Do not wear jewelry, make-up or nail polish.  Do not wear lotions, powders, or perfumes. Please wear deodorant and brush your teeth.  Do not shave 48 hours prior to surgery.  Men may shave face and neck.  Do not bring valuables to the hospital.  [facility] is not responsible for any belongings or valuables.  Contacts, dentures or bridgework may not be worn into surgery.  Leave your suitcase in the car.  After surgery it may be brought to your room.  For patients admitted to the hospital, discharge time will be determined by your treatment team.  Patients discharged the day of surgery will not be allowed to drive home.   Name and phone number of your driver:   family Special instructions:  None  Please read over the following fact sheets that you were given. Anesthesia Post-op Instructions and Care and Recovery After Surgery

## 2019-07-10 ENCOUNTER — Ambulatory Visit (HOSPITAL_COMMUNITY)
Admission: RE | Admit: 2019-07-10 | Discharge: 2019-07-10 | Payer: Medicare Other | Attending: Internal Medicine | Admitting: Internal Medicine

## 2019-07-10 ENCOUNTER — Ambulatory Visit (HOSPITAL_COMMUNITY): Payer: Medicare Other | Admitting: Anesthesiology

## 2019-07-10 ENCOUNTER — Other Ambulatory Visit: Payer: Self-pay

## 2019-07-10 ENCOUNTER — Encounter (HOSPITAL_COMMUNITY)
Admission: RE | Disposition: A | Discharge status: Other Acute Inpatient Hospital | Payer: Self-pay | Source: Home / Self Care | Attending: Internal Medicine

## 2019-07-10 ENCOUNTER — Other Ambulatory Visit (HOSPITAL_COMMUNITY)
Admission: RE | Admit: 2019-07-10 | Discharge: 2019-07-10 | Disposition: A | Discharge status: Home / Self Care | Payer: Medicare Other | Source: Ambulatory Visit | Attending: Internal Medicine | Admitting: Internal Medicine

## 2019-07-10 DIAGNOSIS — Z79899 Other long term (current) drug therapy: Secondary | ICD-10-CM | POA: Insufficient documentation

## 2019-07-10 DIAGNOSIS — D649 Anemia, unspecified: Secondary | ICD-10-CM | POA: Diagnosis not present

## 2019-07-10 DIAGNOSIS — R569 Unspecified convulsions: Secondary | ICD-10-CM | POA: Diagnosis not present

## 2019-07-10 DIAGNOSIS — F39 Unspecified mood [affective] disorder: Secondary | ICD-10-CM | POA: Diagnosis not present

## 2019-07-10 DIAGNOSIS — K219 Gastro-esophageal reflux disease without esophagitis: Secondary | ICD-10-CM | POA: Diagnosis not present

## 2019-07-10 DIAGNOSIS — I341 Nonrheumatic mitral (valve) prolapse: Secondary | ICD-10-CM | POA: Insufficient documentation

## 2019-07-10 DIAGNOSIS — K921 Melena: Secondary | ICD-10-CM | POA: Diagnosis not present

## 2019-07-10 DIAGNOSIS — F79 Unspecified intellectual disabilities: Secondary | ICD-10-CM | POA: Insufficient documentation

## 2019-07-10 DIAGNOSIS — R1013 Epigastric pain: Secondary | ICD-10-CM | POA: Diagnosis not present

## 2019-07-10 DIAGNOSIS — K30 Functional dyspepsia: Secondary | ICD-10-CM

## 2019-07-10 HISTORY — PX: ESOPHAGOGASTRODUODENOSCOPY (EGD) WITH PROPOFOL: SHX5813

## 2019-07-10 LAB — CBC
HCT: 41.2 % (ref 39.0–52.0)
Hemoglobin: 11.9 g/dL — ABNORMAL LOW (ref 13.0–17.0)
MCH: 20.4 pg — ABNORMAL LOW (ref 26.0–34.0)
MCHC: 28.9 g/dL — ABNORMAL LOW (ref 30.0–36.0)
MCV: 70.8 fL — ABNORMAL LOW (ref 80.0–100.0)
Platelets: 97 10*3/uL — ABNORMAL LOW (ref 150–400)
RBC: 5.82 MIL/uL — ABNORMAL HIGH (ref 4.22–5.81)
RDW: 18.4 % — ABNORMAL HIGH (ref 11.5–15.5)
WBC: 6.2 10*3/uL (ref 4.0–10.5)
nRBC: 0.3 % — ABNORMAL HIGH (ref 0.0–0.2)

## 2019-07-10 LAB — SARS CORONAVIRUS 2 BY RT PCR (HOSPITAL ORDER, PERFORMED IN ~~LOC~~ HOSPITAL LAB): SARS Coronavirus 2: NEGATIVE

## 2019-07-10 SURGERY — ESOPHAGOGASTRODUODENOSCOPY (EGD) WITH PROPOFOL
Anesthesia: General

## 2019-07-10 MED ORDER — GLYCOPYRROLATE 0.2 MG/ML IJ SOLN
INTRAMUSCULAR | Status: DC | PRN
  Administered 2019-07-10: 0.2 mg via INTRAVENOUS

## 2019-07-10 MED ORDER — PROMETHAZINE HCL 25 MG/ML IJ SOLN: 6.2500 mg | INTRAMUSCULAR | Status: DC | PRN

## 2019-07-10 MED ORDER — LACTATED RINGERS IV SOLN
INTRAVENOUS | Status: DC
  Administered 2019-07-10: 13:00:00 via INTRAVENOUS

## 2019-07-10 MED ORDER — HYDROMORPHONE HCL 1 MG/ML IJ SOLN: 0.2500 mg | INTRAMUSCULAR | Status: DC | PRN

## 2019-07-10 MED ORDER — MIDAZOLAM HCL 2 MG/2ML IJ SOLN: 0.5000 mg | Freq: Once | INTRAMUSCULAR | Status: DC | PRN

## 2019-07-10 MED ORDER — PROPOFOL 500 MG/50ML IV EMUL
INTRAVENOUS | Status: DC | PRN
  Administered 2019-07-10: 100 ug/kg/min via INTRAVENOUS

## 2019-07-10 MED ORDER — HYDROCODONE-ACETAMINOPHEN 7.5-325 MG PO TABS: 1.0000 | ORAL_TABLET | Freq: Once | ORAL | Status: DC | PRN

## 2019-07-10 MED ORDER — CHLORHEXIDINE GLUCONATE CLOTH 2 % EX PADS: 6.0000 | MEDICATED_PAD | Freq: Once | CUTANEOUS | Status: DC

## 2019-07-10 NOTE — H&P (Signed)
@LOGO @   Primary Care Physician:  Monico Blitz, MD Primary Gastroenterologist:  Dr. Gala Romney  Pre-Procedure History & Physical: HPI:  Cole Stewart is a 43 y.o. male here for further evaluation of epigastric pain via EGD.  Patient denies dysphagia.  Gallbladder out.  Past Medical History:  Diagnosis Date  . Anemia   . GERD (gastroesophageal reflux disease)   . Mental retardation   . Mood disorder ([place])   . MVP (mitral valve prolapse)   . Recurrent abdominal pain   . Seizures ([place])    last seizure was 2010.Unknown etiology  . Thrombocytopenia ([place])     Past Surgical History:  Procedure Laterality Date  . CHOLECYSTECTOMY    . COLONOSCOPY WITH PROPOFOL N/A 12/05/2018   Procedure: COLONOSCOPY WITH PROPOFOL;  Surgeon: Daneil Dolin, MD;  [facility]: [facility];  Service: Endoscopy;  Laterality: N/A;  10:15am  . POLYPECTOMY  12/05/2018   Procedure: POLYPECTOMY;  Surgeon: Daneil Dolin, MD;  Location: [facility];  Service: Endoscopy;;  colon    Prior to Admission medications   Medication Sig Start Date End Date Taking? Authorizing Provider  chlorhexidine (PERIDEX) 0.12 % solution Use as directed 15 mLs in the mouth or throat 2 (two) times daily.    Yes [provider]  clonazePAM (KLONOPIN) 0.5 MG tablet Take 0.25-0.5 mg by mouth See admin instructions. Take 0.25 mg by mouth in the morning and take 0.5 mg at noon and at bedtime   Yes [provider]  DEXILANT 60 MG capsule TAKE 1 CAPSULE BY MOUTH ONCE A DAY. Patient taking differently: Take 60 mg by mouth daily.  03/05/19  Yes Annitta Needs, NP  divalproex (DEPAKOTE) 250 MG DR tablet Take 250-500 mg by mouth See admin instructions. Take 250 mg by mouth at 0700 and take 250 mg by mouth at 1200. Take 500 mg by mouth at bedtime 2000.   Yes [provider]  doxepin (SINEQUAN) 10 MG capsule Take 10 mg by mouth at bedtime.   Yes [provider]  famotidine (PEPCID) 20 MG tablet Take 1 tablet (20 mg  total) by mouth at bedtime. 04/29/19  Yes Aliene Altes S, PA-C  gabapentin (NEURONTIN) 400 MG capsule Take 400-1,200 mg by mouth See admin instructions. Take 400 mg by mouth in the morning and take 1200 mg by mouth at bedtime   Yes [provider]  Iloperidone (FANAPT) 6 MG TABS Take 6 mg by mouth daily.    Yes [provider]  iron polysaccharides (NIFEREX) 150 MG capsule Take 150 mg by mouth 2 (two) times daily.    Yes [provider]  ketoconazole (NIZORAL) 2 % shampoo Apply 1 application topically 2 (two) times a week. *Lather, leave for 5 minutes, then rinse*   Yes [provider]  Olopatadine HCl (PATADAY) 0.2 % SOLN Place 1 drop into both eyes daily.    Yes [provider]  Omega-3 Fatty Acids (FISH OIL OMEGA-3) 1000 MG CAPS Take 2,000 mg by mouth daily.   Yes [provider]  QUEtiapine (SEROQUEL XR) 400 MG 24 hr tablet Take 400 mg by mouth See admin instructions. Take 400 mg by mouth daily at 1600 on an empty stomach   Yes [provider]  Vitamin D, Ergocalciferol, (DRISDOL) 1.25 MG (50000 UT) CAPS capsule Take 50,000 Units by mouth every 30 (thirty) days.   Yes [provider]  vitamin E 200 UNIT capsule Take 200 Units by mouth every morning.  Yes [provider]  zolpidem (AMBIEN) 5 MG tablet Take 5 mg by mouth at bedtime.    Yes [provider]  dicyclomine (BENTYL) 10 MG capsule TAKE 1 CAPSULE FOUR TIMES DAILY AS NEEDED FOR SPASMS. Patient not taking: Reported on 07/07/2019 02/27/19   Carlis Stable, NP    Allergies as of 04/29/2019  . (No Known Allergies)    No family history on file.  Social History   Socioeconomic History  . Marital status: Single    Spouse name: Not on file  . Number of children: Not on file  . Years of education: Not on file  . Highest education level: Not on file  Occupational History  . Not on file  Social Needs  . Financial resource strain: Not on file  .  Food insecurity    Worry: Not on file    Inability: Not on file  . Transportation needs    Medical: Not on file    Non-medical: Not on file  Tobacco Use  . Smoking status: Never Smoker  . Smokeless tobacco: Never Used  Substance and Sexual Activity  . Alcohol use: No  . Drug use: No  . Sexual activity: Never  Lifestyle  . Physical activity    Days per week: Not on file    Minutes per session: Not on file  . Stress: Not on file  Relationships  . Social Herbalist on phone: Not on file    Gets together: Not on file    Attends religious service: Not on file    Active member of club or organization: Not on file    Attends meetings of clubs or organizations: Not on file    Relationship status: Not on file  . Intimate partner violence    Fear of current or ex partner: Not on file    Emotionally abused: Not on file    Physically abused: Not on file    Forced sexual activity: Not on file  Other Topics Concern  . Not on file  Social History Narrative  . Not on file    Review of Systems: See HPI, otherwise negative ROS  Physical Exam: BP 131/80   Pulse 69   Temp (!) 97.5 F (36.4 C) (Oral)   Resp 12   SpO2 99%  General:   Alert, , pleasant and cooperative in NAD Mouth:  No deformity or lesions. Neck:  Supple; no masses or thyromegaly. No significant cervical adenopathy. Lungs:  Clear throughout to auscultation.   No wheezes, crackles, or rhonchi. No acute distress. Heart:  Regular rate and rhythm; no murmurs, clicks, rubs,  or gallops. Abdomen: Non-distended, normal bowel sounds.  Soft and nontender without appreciable mass or hepatosplenomegaly.  Pulses:  Normal pulses noted. Extremities:  Without clubbing or edema.  Impression/Plan: 43 year old gentleman with waxing and waning epigastric pain.  Mentally challenged.  History somewhat unreliable.  But no apparent dysphagia.  Diagnostic EGD today per plan. The risks, benefits, limitations, alternatives and  imponderables have been reviewed with the patient. Potential for esophageal dilation, biopsy, etc. have also been reviewed.  Questions have been answered. All parties agreeable.     Notice: This dictation was prepared with Dragon dictation along with smaller phrase technology. Any transcriptional errors that result from this process are unintentional and may not be corrected upon review.

## 2019-07-10 NOTE — Progress Notes (Signed)
This RN spoke to pt's mother as did Dr. Sydell Axon. Instructed mom that pt is to continue his Dexilant 60 mg daily. Also informed her that son has left our facility with group home caregiver.

## 2019-07-10 NOTE — Transfer of Care (Signed)
Immediate Anesthesia Transfer of Care Note  Patient: AUSTEN OYSTER  Procedure(s) Performed: ESOPHAGOGASTRODUODENOSCOPY (EGD) WITH PROPOFOL (N/A )  Patient Location: PACU  Anesthesia Type:MAC  Level of Consciousness: awake, alert  and oriented  Airway & Oxygen Therapy: Patient Spontanous Breathing and Patient connected to nasal cannula oxygen  Post-op Assessment: Report given to RN and Post -op Vital signs reviewed and stable  Post vital signs: Reviewed and stable  Last Vitals:  Vitals Value Taken Time  BP    Temp    Pulse 52 07/10/19 1257  Resp    SpO2 84 % 07/10/19 1257  Vitals shown include unvalidated device data.  Last Pain:  Vitals:   07/10/19 1227  TempSrc: Oral  PainSc: 0-No pain      Patients Stated Pain Goal: 4 (17/79/39 0300)  Complications: No apparent anesthesia complications

## 2019-07-10 NOTE — Discharge Instructions (Signed)
EGD Discharge instructions Please read the instructions outlined below and refer to this sheet in the next few weeks. These discharge instructions provide you with general information on caring for yourself after you leave the hospital. Your doctor may also give you specific instructions. While your treatment has been planned according to the most current medical practices available, unavoidable complications occasionally occur. If you have any problems or questions after discharge, please call your doctor. ACTIVITY  You may resume your regular activity but move at a slower pace for the next 24 hours.   Take frequent rest periods for the next 24 hours.   Walking will help expel (get rid of) the air and reduce the bloated feeling in your abdomen.   No driving for 24 hours (because of the anesthesia (medicine) used during the test).   You may shower.   Do not sign any important legal documents or operate any machinery for 24 hours (because of the anesthesia used during the test).  NUTRITION  Drink plenty of fluids.   You may resume your normal diet.   Begin with a light meal and progress to your normal diet.   Avoid alcoholic beverages for 24 hours or as instructed by your caregiver.  MEDICATIONS  You may resume your normal medications unless your caregiver tells you otherwise.  WHAT YOU CAN EXPECT TODAY  You may experience abdominal discomfort such as a feeling of fullness or gas pains.  FOLLOW-UP  Your doctor will discuss the results of your test with you.  SEEK IMMEDIATE MEDICAL ATTENTION IF ANY OF THE FOLLOWING OCCUR:  Excessive nausea (feeling sick to your stomach) and/or vomiting.   Severe abdominal pain and distention (swelling).   Trouble swallowing.   Temperature over 101 F (37.8 C).   Rectal bleeding or vomiting of blood.    Continue Dexilant 60 mg daily  Office visit with [place] in 1 month [facility])  I have reviewed my findings and recommendations with the  patient's mother, Mclane Theodorou, at [phone]

## 2019-07-10 NOTE — Anesthesia Postprocedure Evaluation (Signed)
Anesthesia Post Note  Patient: KENTRAVION RIEDEL  Procedure(s) Performed: ESOPHAGOGASTRODUODENOSCOPY (EGD) WITH PROPOFOL (N/A )  Patient location during evaluation: PACU Anesthesia Type: General Level of consciousness: awake and alert and oriented Pain management: pain level controlled Vital Signs Assessment: post-procedure vital signs reviewed and stable Respiratory status: spontaneous breathing, respiratory function stable and nonlabored ventilation Postop Assessment: no apparent nausea or vomiting Anesthetic complications: no     Last Vitals:  Vitals:   07/10/19 1227  BP: 131/80  Pulse: 69  Resp: 12  Temp: (!) 36.4 C  SpO2: 99%    Last Pain:  Vitals:   07/10/19 1227  TempSrc: Oral  PainSc: 0-No pain                 Benaiah Behan

## 2019-07-10 NOTE — Anesthesia Preprocedure Evaluation (Signed)
Anesthesia Evaluation  Patient identified by MRN, date of birth, ID band Patient awake    Reviewed: Allergy & Precautions, NPO status , Patient's Chart, lab work & pertinent test results  Airway Mallampati: II  TM Distance: >3 FB Neck ROM: Full    Dental no notable dental hx. (+) Edentulous Upper, Edentulous Lower   Pulmonary neg pulmonary ROS,    Pulmonary exam normal breath sounds clear to auscultation       Cardiovascular Exercise Tolerance: Good Normal cardiovascular examI+ Valvular Problems/Murmurs MVP  Rhythm:Regular Rate:Normal     Neuro/Psych Seizures -, Well Controlled,  PSYCHIATRIC DISORDERS Severe MR on multiple meds Affect flat  No question   GI/Hepatic Neg liver ROS, GERD  Medicated and Controlled,  Endo/Other  negative endocrine ROS  Renal/GU negative Renal ROS  negative genitourinary   Musculoskeletal negative musculoskeletal ROS (+)   Abdominal   Peds negative pediatric ROS (+)  Hematology negative hematology ROS (+) anemia , Low Platlets -last 90K   Anesthesia Other Findings   Reproductive/Obstetrics negative OB ROS                             Anesthesia Physical Anesthesia Plan  ASA: III  Anesthesia Plan: General   Post-op Pain Management:    Induction: Intravenous  PONV Risk Score and Plan: 2 and Propofol infusion, TIVA and Treatment may vary due to age or medical condition  Airway Management Planned: Nasal Cannula and Simple Face Mask  Additional Equipment:   Intra-op Plan:   Post-operative Plan:   Informed Consent: I have reviewed the patients History and Physical, chart, labs and discussed the procedure including the risks, benefits and alternatives for the proposed anesthesia with the patient or authorized representative who has indicated his/her understanding and acceptance.     Dental advisory given  Plan Discussed with: CRNA  Anesthesia Plan  Comments: (Plan Full PPE use  Plan GA with GETA as needed d/w pt -WTP with same after Q&A)        Anesthesia Quick Evaluation

## 2019-07-17 ENCOUNTER — Encounter (HOSPITAL_COMMUNITY): Payer: Self-pay

## 2019-07-18 ENCOUNTER — Other Ambulatory Visit: Payer: Self-pay

## 2019-07-18 ENCOUNTER — Inpatient Hospital Stay (HOSPITAL_COMMUNITY): Payer: Medicare Other | Attending: Hematology | Admitting: Hematology

## 2019-07-18 ENCOUNTER — Inpatient Hospital Stay (HOSPITAL_COMMUNITY): Payer: Medicare Other

## 2019-07-18 ENCOUNTER — Encounter (HOSPITAL_COMMUNITY): Payer: Self-pay | Admitting: Hematology

## 2019-07-18 VITALS — BP 129/67 | HR 85 | Temp 97.9°F | Resp 18 | Wt 128.0 lb

## 2019-07-18 DIAGNOSIS — D509 Iron deficiency anemia, unspecified: Secondary | ICD-10-CM

## 2019-07-18 DIAGNOSIS — D696 Thrombocytopenia, unspecified: Secondary | ICD-10-CM | POA: Diagnosis not present

## 2019-07-18 LAB — COMPREHENSIVE METABOLIC PANEL
ALT: 23 U/L (ref 0–44)
AST: 20 U/L (ref 15–41)
Albumin: 3.6 g/dL (ref 3.5–5.0)
Alkaline Phosphatase: 66 U/L (ref 38–126)
Anion gap: 8 (ref 5–15)
BUN: 12 mg/dL (ref 6–20)
CO2: 26 mmol/L (ref 22–32)
Calcium: 8.8 mg/dL — ABNORMAL LOW (ref 8.9–10.3)
Chloride: 105 mmol/L (ref 98–111)
Creatinine, Ser: 0.59 mg/dL — ABNORMAL LOW (ref 0.61–1.24)
GFR calc Af Amer: >60 mL/min (ref >60)
GFR calc non Af Amer: >60 mL/min (ref >60)
Glucose, Bld: 90 mg/dL (ref 70–99)
Potassium: 3.9 mmol/L (ref 3.5–5.1)
Sodium: 139 mmol/L (ref 135–145)
Total Bilirubin: 0.8 mg/dL (ref 0.3–1.2)
Total Protein: 6.8 g/dL (ref 6.5–8.1)

## 2019-07-18 LAB — CBC WITH DIFFERENTIAL/PLATELET
Abs Immature Granulocytes: 0.01 10*3/uL (ref 0.00–0.07)
Basophils Absolute: 0 10*3/uL (ref 0.0–0.1)
Basophils Relative: 0 %
Eosinophils Absolute: 0.5 10*3/uL (ref 0.0–0.5)
Eosinophils Relative: 7 %
HCT: 38.9 % — ABNORMAL LOW (ref 39.0–52.0)
Hemoglobin: 11.1 g/dL — ABNORMAL LOW (ref 13.0–17.0)
Immature Granulocytes: 0 %
Lymphocytes Relative: 46 %
Lymphs Abs: 3.2 10*3/uL (ref 0.7–4.0)
MCH: 20.2 pg — ABNORMAL LOW (ref 26.0–34.0)
MCHC: 28.5 g/dL — ABNORMAL LOW (ref 30.0–36.0)
MCV: 70.7 fL — ABNORMAL LOW (ref 80.0–100.0)
Monocytes Absolute: 0.5 10*3/uL (ref 0.1–1.0)
Monocytes Relative: 7 %
Neutro Abs: 2.8 10*3/uL (ref 1.7–7.7)
Neutrophils Relative %: 40 %
Platelets: 118 10*3/uL — ABNORMAL LOW (ref 150–400)
RBC: 5.5 MIL/uL (ref 4.22–5.81)
RDW: 18.5 % — ABNORMAL HIGH (ref 11.5–15.5)
WBC: 6.9 10*3/uL (ref 4.0–10.5)
nRBC: 0.3 % — ABNORMAL HIGH (ref 0.0–0.2)

## 2019-07-18 LAB — IRON AND TIBC
Iron: 140 ug/dL (ref 45–182)
Saturation Ratios: 50 % — ABNORMAL HIGH (ref 17.9–39.5)
TIBC: 280 ug/dL (ref 250–450)
UIBC: 140 ug/dL

## 2019-07-18 LAB — FERRITIN: Ferritin: 862 ng/mL — ABNORMAL HIGH (ref 24–336)

## 2019-07-18 LAB — FOLATE: Folate: 13 ng/mL (ref >5.9)

## 2019-07-18 LAB — LACTATE DEHYDROGENASE: LDH: 128 U/L (ref 98–192)

## 2019-07-18 LAB — SEDIMENTATION RATE: Sed Rate: 2 mm/hr (ref 0–16)

## 2019-07-18 LAB — VITAMIN B12: Vitamin B-12: 410 pg/mL (ref 180–914)

## 2019-07-18 NOTE — Progress Notes (Signed)
CONSULT NOTE  Patient Care Team: Monico Blitz, MD as PCP - General (Internal Medicine) Gala Romney Cristopher Estimable, MD as Consulting Physician (Gastroenterology)  CHIEF COMPLAINTS/PURPOSE OF CONSULTATION:  Anemia and thrombocytopenia.  HISTORY OF PRESENTING ILLNESS:  Cole Stewart 43 y.o. male seen in consultation today at the request of Dr. Gala Romney for further work-up and management of microcytic anemia and thrombocytopenia.  He has been taking Niferex 150 mg daily.  He has mental retardation and lives in a group home.  His caretaker accompanying him did not report any bleeding per rectum or melena.  He had a colonoscopy in January 2020 which showed 5 mm polyp in the cecum, tubular adenoma.  His CBC on 07/10/2019 showed hemoglobin 11.9 with MCV of 70.8.  Platelet count was 97.  He denies any chest pain on exertion, shortness of breath, presyncopal episodes or palpitations.  Patient is not very communicative and history is obtained from caretaker.  No evidence of NSAID ingestion.  Not on any antiplatelet drugs.  No evidence of epistaxis, hematuria or hematochezia.  No prior diagnosis of malignancy.  No evidence of any pica.  No history of blood transfusions.  A CT scan done in December 2019 of the abdomen showed hepatic steatosis.  Denies any fevers, night sweats or weight loss in the last 6 months.  Reports occasional epigastric pain.  Also reported some diarrhea started in the last 1 week, 1-2 watery bowel movements.  Appetite and energy levels are reported 75%.  Family history could not be obtained.    MEDICAL HISTORY:  Past Medical History:  Diagnosis Date  . Anemia   . GERD (gastroesophageal reflux disease)   . Mental retardation   . Mood disorder ([place])   . MVP (mitral valve prolapse)   . Recurrent abdominal pain   . Seizures ([address])    last seizure was 2010.Unknown etiology  . Thrombocytopenia ([place])     SURGICAL HISTORY: Past Surgical History:  Procedure Laterality Date  . CHOLECYSTECTOMY     . COLONOSCOPY WITH PROPOFOL N/A 12/05/2018   Procedure: COLONOSCOPY WITH PROPOFOL;  Surgeon: Daneil Dolin, MD;  [facility];  Service: Endoscopy;  Laterality: N/A;  10:15am  . POLYPECTOMY  12/05/2018   Procedure: POLYPECTOMY;  Surgeon: Daneil Dolin, MD;  Location: [facility];  Service: Endoscopy;;  colon    SOCIAL HISTORY: Social History   Socioeconomic History  . Marital status: Single    Spouse name: Not on file  . Number of children: 0  . Years of education: Not on file  . Highest education level: Not on file  Occupational History  . Not on file  Social Needs  . Financial resource strain: Not on file  . Food insecurity    Worry: Not on file    Inability: Not on file  . Transportation needs    Medical: Not on file    Non-medical: Not on file  Tobacco Use  . Smoking status: Never Smoker  . Smokeless tobacco: Never Used  Substance and Sexual Activity  . Alcohol use: No  . Drug use: No  . Sexual activity: Never  Lifestyle  . Physical activity    Days per week: Not on file    Minutes per session: Not on file  . Stress: Not on file  Relationships  . Social Herbalist on phone: Not on file    Gets together: Not on file    Attends religious service: Not on file  Active member of club or organization: Not on file    Attends meetings of clubs or organizations: Not on file    Relationship status: Not on file  . Intimate partner violence    Fear of current or ex partner: Not on file    Emotionally abused: Not on file    Physically abused: Not on file    Forced sexual activity: Not on file  Other Topics Concern  . Not on file  Social History Narrative  . Not on file    FAMILY HISTORY: History reviewed. No pertinent family history.  ALLERGIES:  has No Known Allergies.  MEDICATIONS:  Current Outpatient Medications  Medication Sig Dispense Refill  . chlorhexidine (PERIDEX) 0.12 % solution Use as directed 15 mLs in the mouth or  throat 2 (two) times daily.     . clonazePAM (KLONOPIN) 0.5 MG tablet Take 0.25-0.5 mg by mouth See admin instructions. Take 0.25 mg by mouth in the morning and take 0.5 mg at noon and at bedtime    . DEXILANT 60 MG capsule TAKE 1 CAPSULE BY MOUTH ONCE A DAY. (Patient taking differently: Take 60 mg by mouth daily. ) 30 capsule 11  . divalproex (DEPAKOTE) 250 MG DR tablet Take 250-500 mg by mouth See admin instructions. Take 250 mg by mouth at 0700 and take 250 mg by mouth at 1200. Take 500 mg by mouth at bedtime 2000.    Marland Kitchen doxepin (SINEQUAN) 10 MG capsule Take 10 mg by mouth at bedtime.    . famotidine (PEPCID) 20 MG tablet Take 20 mg by mouth 2 (two) times daily.    Marland Kitchen gabapentin (NEURONTIN) 400 MG capsule Take 400-1,200 mg by mouth See admin instructions. Take 400 mg by mouth in the morning and take 1200 mg by mouth at bedtime    . Iloperidone (FANAPT) 6 MG TABS Take 6 mg by mouth daily.     . iron polysaccharides (NIFEREX) 150 MG capsule Take 150 mg by mouth daily.    Marland Kitchen ketoconazole (NIZORAL) 2 % shampoo Apply 1 application topically 2 (two) times a week. *Lather, leave for 5 minutes, then rinse*    . Olopatadine HCl (PATADAY) 0.2 % SOLN Place 1 drop into both eyes daily.     . Omega-3 Fatty Acids (FISH OIL OMEGA-3) 1000 MG CAPS Take 2,000 mg by mouth daily.    . Vitamin D, Ergocalciferol, (DRISDOL) 1.25 MG (50000 UT) CAPS capsule Take 50,000 Units by mouth every 30 (thirty) days.    . vitamin E 200 UNIT capsule Take 200 Units by mouth every morning.    . zolpidem (AMBIEN) 5 MG tablet Take 5 mg by mouth at bedtime.      No current facility-administered medications for this visit.     REVIEW OF SYSTEMS:   Constitutional: Denies fevers, chills or abnormal night sweats Eyes: Denies blurriness of vision, double vision or watery eyes Ears, nose, mouth, throat, and face: Denies mucositis or sore throat Respiratory: Denies cough, dyspnea or wheezes Cardiovascular: Denies palpitation, chest  discomfort or lower extremity swelling Gastrointestinal:  Denies nausea, heartburn or change in bowel habits.  Occasional epigastric pain reported.  Occasional diarrhea since started on iron pills. Skin: Denies abnormal skin rashes Lymphatics: Denies new lymphadenopathy or easy bruising Neurological:Denies numbness, tingling or new weaknesses Behavioral/Psych: Mood is stable, no new changes  All other systems were reviewed with the patient and are negative.  PHYSICAL EXAMINATION: ECOG PERFORMANCE STATUS: 1 - Symptomatic but completely ambulatory  Vitals:  07/18/19 0820  BP: 129/67  Pulse: 85  Resp: 18  Temp: 97.9 F (36.6 C)  SpO2: 100%   Filed Weights   07/18/19 0820  Weight: 128 lb (58.1 kg)    GENERAL:alert, no distress and comfortable SKIN: skin color, texture, turgor are normal, no rashes or significant lesions EYES: normal, conjunctiva are pink and non-injected, sclera clear OROPHARYNX:no exudate, no erythema and lips, buccal mucosa, and tongue normal  NECK: supple, thyroid normal size, non-tender, without nodularity LYMPH:  no palpable lymphadenopathy in the cervical, axillary or inguinal LUNGS: clear to auscultation and percussion with normal breathing effort HEART: regular rate & rhythm and no murmurs and no lower extremity edema ABDOMEN:abdomen soft, non-tender and normal bowel sounds Musculoskeletal:no cyanosis of digits and no clubbing  PSYCH: alert & oriented x 3 with fluent speech NEURO: no focal motor/sensory deficits  LABORATORY DATA:  I have reviewed the data as listed Recent Results (from the past 2160 hour(s))  SARS Coronavirus 2 [facility] order, Performed in [place] hospital lab) Nasopharyngeal Nasopharyngeal Swab     Status: None   Collection Time: 07/10/19  8:00 AM   Specimen: Nasopharyngeal Swab  Result Value Ref Range   SARS Coronavirus 2 NEGATIVE NEGATIVE    Comment: (NOTE) If result is NEGATIVE SARS-CoV-2 target nucleic acids are NOT  DETECTED. The SARS-CoV-2 RNA is generally detectable in upper and lower  respiratory specimens during the acute phase of infection. The lowest  concentration of SARS-CoV-2 viral copies this assay can detect is 250  copies / mL. A negative result does not preclude SARS-CoV-2 infection  and should not be used as the sole basis for treatment or other  patient management decisions.  A negative result may occur with  improper specimen collection / handling, submission of specimen other  than nasopharyngeal swab, presence of viral mutation(s) within the  areas targeted by this assay, and inadequate number of viral copies  (<250 copies / mL). A negative result must be combined with clinical  observations, patient history, and epidemiological information. If result is POSITIVE SARS-CoV-2 target nucleic acids are DETECTED. The SARS-CoV-2 RNA is generally detectable in upper and lower  respiratory specimens dur ing the acute phase of infection.  Positive  results are indicative of active infection with SARS-CoV-2.  Clinical  correlation with patient history and other diagnostic information is  necessary to determine patient infection status.  Positive results do  not rule out bacterial infection or co-infection with other viruses. If result is PRESUMPTIVE POSTIVE SARS-CoV-2 nucleic acids MAY BE PRESENT.   A presumptive positive result was obtained on the submitted specimen  and confirmed on repeat testing.  While 2019 novel coronavirus  (SARS-CoV-2) nucleic acids may be present in the submitted sample  additional confirmatory testing may be necessary for epidemiological  and / or clinical management purposes  to differentiate between  SARS-CoV-2 and other Sarbecovirus currently known to infect humans.  If clinically indicated additional testing with an alternate test  methodology [phone]) is advised. The SARS-CoV-2 RNA is generally  detectable in upper and lower respiratory sp ecimens during  the acute  phase of infection. The expected result is Negative. Fact Sheet for Patients:  StrictlyIdeas.no Fact Sheet for Healthcare Providers: BankingDealers.co.za This test is not yet approved or cleared by the [place] FDA and has been authorized for detection and/or diagnosis of SARS-CoV-2 by FDA under an Emergency Use Authorization (EUA).  This EUA will remain in effect (meaning this test can be used) for the duration  of the COVID-19 declaration under Section 564(b)(1) of the Act, 21 U.S.C. section 360bbb-3(b)(1), unless the authorization is terminated or revoked sooner. Performed at [facility], [address] [place], [place] [address]   CBC     Status: Abnormal   Collection Time: 07/10/19 12:30 PM  Result Value Ref Range   WBC 6.2 4.0 - 10.5 K/uL   RBC 5.82 (H) 4.22 - 5.81 MIL/uL   Hemoglobin 11.9 (L) 13.0 - 17.0 g/dL   HCT 41.2 39.0 - 52.0 %   MCV 70.8 (L) 80.0 - 100.0 fL   MCH 20.4 (L) 26.0 - 34.0 pg   MCHC 28.9 (L) 30.0 - 36.0 g/dL   RDW 18.4 (H) 11.5 - 15.5 %   Platelets 97 (L) 150 - 400 K/uL    Comment: SPECIMEN CHECKED FOR CLOTS Immature Platelet Fraction may be clinically indicated, consider ordering this additional test [id number] CONSISTENT WITH PREVIOUS RESULT    nRBC 0.3 (H) 0.0 - 0.2 %    Comment: Performed at [facility], [address], [place], [place] [address]  CBC with Differential/Platelet     Status: Abnormal   Collection Time: 07/18/19  9:25 AM  Result Value Ref Range   WBC 6.9 4.0 - 10.5 K/uL   RBC 5.50 4.22 - 5.81 MIL/uL   Hemoglobin 11.1 (L) 13.0 - 17.0 g/dL   HCT 38.9 (L) 39.0 - 52.0 %   MCV 70.7 (L) 80.0 - 100.0 fL   MCH 20.2 (L) 26.0 - 34.0 pg   MCHC 28.5 (L) 30.0 - 36.0 g/dL   RDW 18.5 (H) 11.5 - 15.5 %   Platelets 118 (L) 150 - 400 K/uL    Comment: REPEATED TO VERIFY PLATELET COUNT CONFIRMED BY SMEAR SPECIMEN CHECKED FOR CLOTS    nRBC 0.3 (H) 0.0 - 0.2 %   Neutrophils Relative %  40 %   Neutro Abs 2.8 1.7 - 7.7 K/uL   Lymphocytes Relative 46 %   Lymphs Abs 3.2 0.7 - 4.0 K/uL   Monocytes Relative 7 %   Monocytes Absolute 0.5 0.1 - 1.0 K/uL   Eosinophils Relative 7 %   Eosinophils Absolute 0.5 0.0 - 0.5 K/uL   Basophils Relative 0 %   Basophils Absolute 0.0 0.0 - 0.1 K/uL   Immature Granulocytes 0 %   Abs Immature Granulocytes 0.01 0.00 - 0.07 K/uL    Comment: Performed at [facility], [address] [place],  [address]  Comprehensive metabolic panel     Status: Abnormal   Collection Time: 07/18/19  9:25 AM  Result Value Ref Range   Sodium 139 135 - 145 mmol/L   Potassium 3.9 3.5 - 5.1 mmol/L   Chloride 105 98 - 111 mmol/L   CO2 26 22 - 32 mmol/L   Glucose, Bld 90 70 - 99 mg/dL   BUN 12 6 - 20 mg/dL   Creatinine, Ser 0.59 (L) 0.61 - 1.24 mg/dL   Calcium 8.8 (L) 8.9 - 10.3 mg/dL   Total Protein 6.8 6.5 - 8.1 g/dL   Albumin 3.6 3.5 - 5.0 g/dL   AST 20 15 - 41 U/L   ALT 23 0 - 44 U/L   Alkaline Phosphatase 66 38 - 126 U/L   Total Bilirubin 0.8 0.3 - 1.2 mg/dL   GFR calc non Af Amer >60 >60 mL/min   GFR calc Af Amer >60 >60 mL/min   Anion gap 8 5 - 15    Comment: Performed at [facility], [address],  [place], [place] [address]  Lactate dehydrogenase     Status: None   Collection Time: 07/18/19  9:25 AM  Result Value Ref Range   LDH 128 98 - 192 U/L    Comment: Performed at [facility], [address], [place], [place] [address]  Sedimentation rate     Status: None   Collection Time: 07/18/19  9:25 AM  Result Value Ref Range   Sed Rate 2 0 - 16 mm/hr    Comment: Performed at [facility], [address], [place], [place] [address]  Iron and TIBC     Status: Abnormal   Collection Time: 07/18/19  9:25 AM  Result Value Ref Range   Iron 140 45 - 182 ug/dL   TIBC 280 250 - 450 ug/dL   Saturation Ratios 50 (H) 17.9 - 39.5 %   UIBC 140 ug/dL    Comment: Performed at [facility], [address] [place], [place] [address]  Ferritin     Status:  Abnormal   Collection Time: 07/18/19  9:25 AM  Result Value Ref Range   Ferritin 862 (H) 24 - 336 ng/mL    Comment: Performed at [facility], [address], [place], [place] [address]  Vitamin B12     Status: None   Collection Time: 07/18/19  9:25 AM  Result Value Ref Range   Vitamin B-12 410 180 - 914 pg/mL    Comment: (NOTE) This assay is not validated for testing neonatal or myeloproliferative syndrome specimens for Vitamin B12 levels. Performed at [facility], [address] [place], [place] [address]   Folate     Status: None   Collection Time: 07/18/19  9:25 AM  Result Value Ref Range   Folate 13.0 >5.9 ng/mL    Comment: Performed at [facility], [address], [place], [place] [address]    RADIOGRAPHIC STUDIES: I have personally reviewed the radiological images as listed and agreed with the findings in the report.  ASSESSMENT & PLAN:  Microcytic anemia 1.  Microcytic anemia: - Patient has microcytic anemia since 2009, with hemoglobin between 11 and 12. - MCV was between 66 and 70.  He is on Niferex 150 mg daily. - Colonoscopy on 12/05/2018 shows one 5 mm polyp in the cecum.  Otherwise normal exam.  Pathology showed tubular adenoma. - Patient cannot give complete history.  History was obtained from the caretaker from group home. - We will check his CBC today, rule out nutritional deficiencies by checking ferritin, iron panel, 123456, folic acid, methylmalonic acid and copper levels. 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  However we will make sure he has adequate iron levels before we check his hemoglobin electrophoresis. - We will also check his stool for occult blood. -We will see him back in 3 weeks for follow-up.  2.  Thrombocytopenia: -He has mild to moderate thrombocytopenia since 2014. -No bleeding including nosebleeds, hematuria reported.  No easy bruising reported. -I have reviewed his CT scan from  December 2019 of the abdomen.  Hepatic steatosis was present.  Craniocaudal spleen size is about 13 cm, slightly more than upper limit of normal. - It is possible that his spleen enlargement is contributing to thrombocytopenia.  However we will check for nutritional deficiencies, connective tissue disorders and infectious etiologies.     All questions were answered. The patient knows to call the clinic with any problems, questions or concerns.  Derek Jack, MD 07/18/19 6:21 PM

## 2019-07-18 NOTE — Op Note (Signed)
[facility] Patient Name: Cole Stewart Procedure Date: 07/10/2019 12:21 PM MRN: [id number] Date of Birth: 08/11/1976 Attending MD: Norvel Richards , MD CSN: [id number] Age: 43 Admit Type: Outpatient Procedure:                Upper GI endoscopy Indications:              Dyspepsia Providers:                Norvel Richards, MD, Jeanann Lewandowsky. Sharon Seller, RN,                            Aram Candela Referring MD:              Medicines:                Propofol per Anesthesia Complications:            No immediate complications. Estimated Blood Loss:     Estimated blood loss: none. Procedure:                Pre-Anesthesia Assessment:                           - Prior to the procedure, a History and Physical                            was performed, and patient medications and                            allergies were reviewed. The patient's tolerance of                            previous anesthesia was also reviewed. The risks                            and benefits of the procedure and the sedation                            options and risks were discussed with the patient.                            All questions were answered, and informed consent                            was obtained. ASA Grade Assessment: II - A patient                            with mild systemic disease. After reviewing the                            risks and benefits, the patient was deemed in                            satisfactory condition to undergo the procedure.  After obtaining informed consent, the endoscope was                            passed under direct vision. Throughout the                            procedure, the patient's blood pressure, pulse, and                            oxygen saturations were monitored continuously. The                            [id number] scope was introduced through the                            mouth, and advanced to the  second part of duodenum.                            The upper GI endoscopy was accomplished without                            difficulty. The patient tolerated the procedure                            well. Scope In: 12:49:31 PM Scope Out: 12:52:16 PM Total Procedure Duration: 0 hours 2 minutes 45 seconds  Findings:      The examined esophagus was normal.      The entire examined stomach was normal.      The duodenal bulb and second portion of the duodenum were normal. Impression:               - Normal esophagus.                           - Normal stomach.                           - Normal duodenal bulb and second portion of the                            duodenum.                           - No specimens collected. Moderate Sedation:      Moderate (conscious) sedation was personally administered by an       anesthesia professional. The following parameters were monitored: oxygen       saturation, heart rate, blood pressure, respiratory rate, EKG, adequacy       of pulmonary ventilation, and response to care. Recommendation:           - Patient has a contact number available for                            emergencies. The signs and symptoms of potential  delayed complications were discussed with the                            patient. Return to normal activities tomorrow.                            Written discharge instructions were provided to the                            patient.                           - Resume previous diet.                           - Continue present medications. Continue Dexilant                            60 mg daily                           - Return to GI office in 4 weeks. Procedure Code(s):        --- Professional ---                           [phone], Esophagogastroduodenoscopy, flexible,                            transoral; diagnostic, including collection of                            specimen(s) by brushing or  washing, when performed                            (separate procedure) Diagnosis Code(s):        --- Professional ---                           R10.13, Epigastric pain CPT copyright 2019 American Medical Association. All rights reserved. The codes documented in this report are preliminary and upon coder review may  be revised to meet current compliance requirements. Cristopher Estimable. Lux Meaders, MD Norvel Richards, MD 07/18/2019 12:25:08 PM This report has been signed electronically. Number of Addenda: 0

## 2019-07-18 NOTE — Assessment & Plan Note (Addendum)
1.  Microcytic anemia: - Patient has microcytic anemia since 2009, with hemoglobin between 11 and 12. - MCV was between 66 and 70.  He is on Niferex 150 mg daily. - Colonoscopy on 12/05/2018 shows one 5 mm polyp in the cecum.  Otherwise normal exam.  Pathology showed tubular adenoma. - Patient cannot give complete history.  History was obtained from the caretaker from group home. - We will check his CBC today, rule out nutritional deficiencies by checking ferritin, iron panel, 123456, folic acid, methylmalonic acid and copper levels. 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  However we will make sure he has adequate iron levels before we check his hemoglobin electrophoresis. - We will also check his stool for occult blood. -We will see him back in 3 weeks for follow-up.  2.  Thrombocytopenia: -He has mild to moderate thrombocytopenia since 2014. -No bleeding including nosebleeds, hematuria reported.  No easy bruising reported. -I have reviewed his CT scan from December 2019 of the abdomen.  Hepatic steatosis was present.  Craniocaudal spleen size is about 13 cm, slightly more than upper limit of normal. - It is possible that his spleen enlargement is contributing to thrombocytopenia.  However we will check for nutritional deficiencies, connective tissue disorders and infectious etiologies.

## 2019-07-18 NOTE — Patient Instructions (Signed)
[place] at [facility] Discharge Instructions  You were seen today by Dr. Delton Coombes. He went over your history and how you've been feeling lately. He will see you back in 3 weeks for labs and follow up.   Thank you for choosing [place] at [facility] to provide your oncology and hematology care.  To afford each patient quality time with our provider, please arrive at least 15 minutes before your scheduled appointment time.   If you have a lab appointment with the [facility] please come in thru the  Main Entrance and check in at the main information desk  You need to re-schedule your appointment should you arrive 10 or more minutes late.  We strive to give you quality time with our providers, and arriving late affects you and other patients whose appointments are after yours.  Also, if you no show three or more times for appointments you may be dismissed from the clinic at the providers discretion.     Again, thank you for choosing [facility].  Our hope is that these requests will decrease the amount of time that you wait before being seen by our physicians.       _____________________________________________________________  Should you have questions after your visit to [facility], please contact our office at [phone] between the hours of 8:00 a.m. and 4:30 p.m.  Voicemails left after 4:00 p.m. will not be returned until the following business day.  For prescription refill requests, have your pharmacy contact our office and allow 72 hours.    Cancer Center Support Programs:   > Cancer Support Group  2nd Tuesday of the month 1pm-2pm, Journey Room

## 2019-07-19 LAB — RHEUMATOID FACTOR: Rheumatoid fact SerPl-aCnc: <10 IU/mL (ref 0.0–13.9)

## 2019-07-19 LAB — HEPATITIS B CORE ANTIBODY, TOTAL: Hep B Core Total Ab: NEGATIVE

## 2019-07-19 LAB — HEPATITIS B SURFACE ANTIBODY,QUALITATIVE: Hep B S Ab: REACTIVE

## 2019-07-19 LAB — HCV COMMENT:

## 2019-07-19 LAB — HEPATITIS C ANTIBODY (REFLEX): HCV Ab: 0.1 s/co ratio (ref 0.0–0.9)

## 2019-07-19 LAB — HEPATITIS B SURFACE ANTIGEN: Hepatitis B Surface Ag: NEGATIVE

## 2019-07-21 ENCOUNTER — Other Ambulatory Visit (HOSPITAL_COMMUNITY): Payer: Medicare Other

## 2019-07-21 ENCOUNTER — Encounter (HOSPITAL_COMMUNITY): Payer: Self-pay | Admitting: Internal Medicine

## 2019-07-21 LAB — PROTEIN ELECTROPHORESIS, SERUM
A/G Ratio: 1.3 (ref 0.7–1.7)
Albumin ELP: 3.5 g/dL (ref 2.9–4.4)
Alpha-1-Globulin: 0.2 g/dL (ref 0.0–0.4)
Alpha-2-Globulin: 0.6 g/dL (ref 0.4–1.0)
Beta Globulin: 0.7 g/dL (ref 0.7–1.3)
Gamma Globulin: 1.3 g/dL (ref 0.4–1.8)
Globulin, Total: 2.7 g/dL (ref 2.2–3.9)
Total Protein ELP: 6.2 g/dL (ref 6.0–8.5)

## 2019-07-21 LAB — PATHOLOGIST SMEAR REVIEW

## 2019-07-21 LAB — ANTINUCLEAR ANTIBODIES, IFA: ANA Ab, IFA: NEGATIVE

## 2019-07-22 LAB — COPPER, SERUM: Copper: 142 ug/dL (ref 72–166)

## 2019-07-22 LAB — H PYLORI, IGM, IGG, IGA AB
H Pylori IgG: 0.4 Index Value (ref 0.00–0.79)
H. Pylogi, Iga Abs: 19.5 units — ABNORMAL HIGH (ref 0.0–8.9)
H. Pylogi, Igm Abs: <9 units (ref 0.0–8.9)

## 2019-07-23 LAB — METHYLMALONIC ACID, SERUM: Methylmalonic Acid, Quantitative: 151 nmol/L (ref 0–378)

## 2019-07-24 ENCOUNTER — Other Ambulatory Visit (HOSPITAL_COMMUNITY): Payer: Medicare Other

## 2019-07-28 NOTE — Progress Notes (Signed)
Received. Patient has been referred to hematology.

## 2019-07-31 ENCOUNTER — Other Ambulatory Visit: Payer: Self-pay | Admitting: Gastroenterology

## 2019-08-06 DIAGNOSIS — F419 Anxiety disorder, unspecified: Secondary | ICD-10-CM | POA: Diagnosis not present

## 2019-08-11 ENCOUNTER — Ambulatory Visit (HOSPITAL_COMMUNITY): Payer: Medicare Other | Admitting: Hematology

## 2019-08-19 DIAGNOSIS — Z23 Encounter for immunization: Secondary | ICD-10-CM | POA: Diagnosis not present

## 2019-08-27 NOTE — Progress Notes (Signed)
Referring Provider: Monico Blitz, MD Primary Care Physician:  Monico Blitz, MD Primary GI Physician: Dr. Gala Romney  Chief Complaint  Patient presents with   Abdominal Pain    HPI:   Cole Stewart is a 43 y.o. male who is mentally challenged with a GI history significant for cholecystectomy, GERD, chronic abdominal pain, and diarrhea. Also with history of anemia and thrombocytopenia. Other history below. Colonoscopy up to date in 2020. Due for repeat in 2025. Patient presenting today for follow up. He was last seen in our office on 04/29/19 for follow-up of diarrhea, epigastric pain, and anemia. Diarrhea had improved since increasing fruits, vegetables, and fiber intake and limiting bread and dairy. He continued with frequent epigastric pain with occasional nausea. Recommendations to continue Dexilant, add Pepcid at night, and proceed with EGD. Recheck CBC and iron panel. Consider referral to hematology.    Labs with hemoglobin stable at 11.8. Platelets low 86,000, Iron Saturation 49%, TIBC 292, UIBC 149 (all normal), ferritin 1416, Cr 0.64, sodium 140, potassium 4.7, AST 25, ALT 20, alk phos 77, total bili 0.7. Patient was referred to hematology.  EGD on 07/18/19 with normal esophagus, stomach, duodenal bulb, and second portion of the duodenum.   Dr. Delton Coombes checked H. Pylori serologies. H pylori IgA Ab elevated at 19.5 (normal <8.9) on 07/18/19.  Today patient is accompanied by a caregiver who provides a very limited history. He resides at [facility]. Unable to get any significant history of patient due to mental handicap and limited communication. Caregiver reports epigastric pain seems to be improved. Occurring less frequently. A few times a week. Not sure when this occurs. States patient will lay down when his stomach hurts. Sometimes after lunch. Eating sandwiches most days for lunch. Eating 3 meals a day. Continues to be very active. Patient continues to point to the epigastric  area when asking where he feels his pain. No nausea, vomiting, or acid reflux. When asking about constipation, patient states "yes." BMs are not daily. Caregiver reports when he sees stools, they are typically Bristol 2. No blood in the stool or black stools. No lower abdominal pain.  Gained 3 pounds since June 2020.    Past Medical History:  Diagnosis Date   Anemia    GERD (gastroesophageal reflux disease)    Mental retardation    Mood disorder ([place])    MVP (mitral valve prolapse)    Recurrent abdominal pain    Seizures ([place])    last seizure was 2010.Unknown etiology   Thrombocytopenia ([place])     Past Surgical History:  Procedure Laterality Date   CHOLECYSTECTOMY     COLONOSCOPY WITH PROPOFOL N/A 12/05/2018   Procedure: COLONOSCOPY WITH PROPOFOL;  Surgeon: Daneil Dolin, MD;  [facility];  Service: Endoscopy;  Laterality: N/A;  10:15am   ESOPHAGOGASTRODUODENOSCOPY (EGD) WITH PROPOFOL N/A 07/10/2019   PROPOFOL;  Surgeon: Daneil Dolin, MD; normal exam   POLYPECTOMY  12/05/2018   Procedure: POLYPECTOMY;  Surgeon: Daneil Dolin, MD;  [facility];  Service: Endoscopy;;  colon    Current Outpatient Medications  Medication Sig Dispense Refill   chlorhexidine (PERIDEX) 0.12 % solution Use as directed 15 mLs in the mouth or throat 2 (two) times daily.      clonazePAM (KLONOPIN) 0.5 MG tablet Take 0.25-0.5 mg by mouth See admin instructions. Take 0.25 mg by mouth in the morning and take 0.5 mg at noon and at bedtime     DEXILANT  60 MG capsule TAKE 1 CAPSULE BY MOUTH ONCE A DAY. (Patient taking differently: Take 60 mg by mouth daily. ) 30 capsule 11   divalproex (DEPAKOTE) 250 MG DR tablet Take 250-500 mg by mouth See admin instructions. Take 250 mg by mouth at 0700 and take 250 mg by mouth at 1200. Take 500 mg by mouth at bedtime 2000.     doxepin (SINEQUAN) 10 MG capsule Take 10 mg by mouth at bedtime.     famotidine (PEPCID) 20 MG tablet TAKE 1  TABLET BY MOUTH AT BEDTIME. 30 tablet 11   gabapentin (NEURONTIN) 400 MG capsule Take 400-1,200 mg by mouth See admin instructions. Take 400 mg by mouth in the morning and take 1200 mg by mouth at bedtime     Iloperidone (FANAPT) 6 MG TABS Take 6 mg by mouth daily.      iron polysaccharides (NIFEREX) 150 MG capsule Take 150 mg by mouth daily.     ketoconazole (NIZORAL) 2 % shampoo Apply 1 application topically 2 (two) times a week. *Lather, leave for 5 minutes, then rinse*     Olopatadine HCl (PATADAY) 0.2 % SOLN Place 1 drop into both eyes daily.      Omega-3 Fatty Acids (FISH OIL OMEGA-3) 1000 MG CAPS Take 2,000 mg by mouth daily.     QUEtiapine (SEROQUEL XR) 400 MG 24 hr tablet Take 400 mg by mouth at bedtime.     Vitamin D, Ergocalciferol, (DRISDOL) 1.25 MG (50000 UT) CAPS capsule Take 50,000 Units by mouth every 30 (thirty) days.     vitamin E 200 UNIT capsule Take 200 Units by mouth every morning.     zolpidem (AMBIEN) 5 MG tablet Take 5 mg by mouth at bedtime.      polyethylene glycol powder (MIRALAX) 17 GM/SCOOP powder Take 17 g by mouth daily as needed for mild constipation. 255 g 3   No current facility-administered medications for this visit.     Allergies as of 08/28/2019   (No Known Allergies)    History reviewed. No pertinent family history.  Social History   Socioeconomic History   Marital status: Single    Spouse name: Not on file   Number of children: 0   Years of education: Not on file   Highest education level: Not on file  Occupational History   Not on file  Social Needs   Financial resource strain: Not on file   Food insecurity    Worry: Not on file    Inability: Not on file   Transportation needs    Medical: Not on file    Non-medical: Not on file  Tobacco Use   Smoking status: Never Smoker   Smokeless tobacco: Never Used  Substance and Sexual Activity   Alcohol use: No   Drug use: No   Sexual activity: Never  Lifestyle    Physical activity    Days per week: Not on file    Minutes per session: Not on file   Stress: Not on file  Relationships   Social connections    Talks on phone: Not on file    Gets together: Not on file    Attends religious service: Not on file    Active member of club or organization: Not on file    Attends meetings of clubs or organizations: Not on file    Relationship status: Not on file  Other Topics Concern   Not on file  Social History Narrative   Not on file  Review of Systems: Gen: Denies fever, chills, lightheadedness, dizziness. CV: Denies chest pain, palpitations. Resp: Denies dyspnea, cough GI: See HPI Derm: Denies rash Psych: Denies depression, anxiety Heme: Denies bruising, bleeding  Physical Exam: BP 112/81    Pulse 68    Temp (!) 96.7 F (35.9 C)    Ht 5' 8"  (1.727 m)    Wt 150 lb 3.2 oz (68.1 kg)    BMI 22.84 kg/m  General:   Alert. No distress noted. Pleasant and cooperative.  Head:  Normocephalic and atraumatic. Eyes:  Conjuctiva clear without scleral icterus. Mouth:  Mask in place Heart:  S1, S2 present without murmurs appreciated. Lungs:  Clear to auscultation bilaterally. No wheezes, rales, or rhonchi. No distress.  Abdomen:  +BS, soft, non-tender and non-distended. No rebound or guarding. No HSM or masses noted. Msk:  Symmetrical without gross deformities. Normal posture. Extremities:  Without edema. Neurologic: Oriented to self.  Unable to assess any further due to mental handicap with limited communication. Psych: Normal mood and affect.

## 2019-08-28 ENCOUNTER — Encounter: Payer: Self-pay | Admitting: Gastroenterology

## 2019-08-28 ENCOUNTER — Other Ambulatory Visit: Payer: Self-pay

## 2019-08-28 ENCOUNTER — Ambulatory Visit (INDEPENDENT_AMBULATORY_CARE_PROVIDER_SITE_OTHER): Payer: Medicare Other | Admitting: Gastroenterology

## 2019-08-28 VITALS — BP 112/81 | HR 68 | Temp 96.7°F | Ht 68.0 in | Wt 150.2 lb

## 2019-08-28 DIAGNOSIS — R1013 Epigastric pain: Secondary | ICD-10-CM | POA: Diagnosis not present

## 2019-08-28 DIAGNOSIS — K5900 Constipation, unspecified: Secondary | ICD-10-CM

## 2019-08-28 MED ORDER — POLYETHYLENE GLYCOL 3350 17 GM/SCOOP PO POWD: 17.0000 g | Freq: Every day | ORAL | 3 refills | Status: DC | PRN

## 2019-08-28 NOTE — Patient Instructions (Signed)
Continue Dexilant 60 mg daily.  Continue Pepcid 20 mg nightly.  Please start MiraLAX 1 capful (17 g) daily as needed for constipation.  Goal of 1 soft formed bowel movement daily.  Please keep documentation of when patient is having abdominal pain along with what patient was doing around the time of onset to help guide further evaluation.  Please send this with patient at next visit.  Please keep documentation of stools and their frequency and send with patient at next visit.  Follow-up in 3-4 months.  Call if worsening of symptoms prior to next visit.  Aliene Altes, PA-C [facility]

## 2019-08-28 NOTE — Assessment & Plan Note (Addendum)
Patient continues with chronic epigastric pain.  Symptoms are somewhat improved since adding Pepcid 20 mg in the evenings.  He does continue to have epigastric pain a few times a week.  Difficult to obtain any significant history from patient due to mental handicap with limited communication.  Caregiver with patient today provides very limited history.  Not sure exactly how often or when epigastric pain is occurring.  Sometimes after lunch.  No identified triggers.  He does eat sandwiches almost daily for lunch.  Has been tested for celiac in the past with only mild elevation of Gliadin IgA.  Has tried gluten-free diet in the past without significant improvement. No reflux symptoms or alarm symptoms.  His only other GI complaint at this time is constipation with [facility] 2 stools. Weight is now stable with 3 pound weight gain since June 2020.  Prior work-up with CT abdomen and pelvis in December 2019 without acute GI abnormalities.  Recent EGD in September 2020 with normal esophagus, normal stomach, normal duodenum.  Dr. Delton Coombes checked H. Pylori serologies in September 2020.  H pylori IgA antibody was elevated at 19.5 (normal <8.9).  Not clear whether patient was treated for H. pylori or not.  Abdominal exam is benign.  Query whether constipation may be contributing to epigastric pain.  With H. pylori IgA antibody elevated, I do wonder if this is playing a role although his stomach appeared normal without inflammation on EGD.  It is possible to only amount in IgA response with H. pylori.  I am requesting nursing staff to reach out to his group home to inquire if he has been treated for H. pylori.  May end up needing to reach out to Dr. Delton Coombes.   As epigastric pain seems to be improving slightly, will try to optimize bowel habits by adding MiraLAX 1 capful daily as needed for constipation to see if this helps with his pain.  Continue Dexilant daily and Pepcid nightly. Requested for facility to keep a log  of patient's epigastric pain and what was occurring around the time of pain onset.  Requested they send log with patient to next office visit. Follow-up in 3-4 months.  Call if worsening of symptoms prior to next visit.

## 2019-08-29 ENCOUNTER — Encounter: Payer: Self-pay | Admitting: Gastroenterology

## 2019-08-29 NOTE — Assessment & Plan Note (Addendum)
Difficult to obtain clear history of stool frequency.  Patient reporting BM about every other day.  Caregiver reports stools are Bristol 2.  No alarm symptoms.  He does have ongoing chronic intermittent epigastric pain without identified etiology.  This does seem to be improving some since adding Pepcid 20 mg nightly.  Query whether constipation is contributing to his epigastric pain. Colonoscopy up to date. Due for repeat in 2025.   Start MiraLAX 1 capful (17 g) daily as needed for constipation. Requested a group home keep a log of bowel bowel movements and send with patient at next office visit. Follow-up in 3-4 months.  Call if worsening of symptoms prior to next visit.

## 2019-09-02 ENCOUNTER — Encounter (HOSPITAL_COMMUNITY): Payer: Self-pay | Admitting: Hematology

## 2019-09-02 ENCOUNTER — Other Ambulatory Visit: Payer: Self-pay

## 2019-09-02 ENCOUNTER — Inpatient Hospital Stay (HOSPITAL_COMMUNITY): Payer: Medicare Other | Attending: Hematology | Admitting: Hematology

## 2019-09-02 VITALS — BP 116/76 | HR 88 | Temp 97.3°F | Resp 15 | Wt 149.1 lb

## 2019-09-02 DIAGNOSIS — D696 Thrombocytopenia, unspecified: Secondary | ICD-10-CM | POA: Diagnosis not present

## 2019-09-02 DIAGNOSIS — D509 Iron deficiency anemia, unspecified: Secondary | ICD-10-CM | POA: Diagnosis not present

## 2019-09-02 NOTE — Patient Instructions (Signed)
[name] at [facility] Discharge Instructions  You were seen today by Dr. Katragadda. He went over your recent lab results. He will see you back in 3 months for labs and follow up.   Thank you for choosing [facility] at [facility] to provide your oncology and hematology care.  To afford each patient quality time with our provider, please arrive at least 15 minutes before your scheduled appointment time.   If you have a lab appointment with the Cancer Center please come in thru the  Main Entrance and check in at the main information desk  You need to re-schedule your appointment should you arrive 10 or more minutes late.  We strive to give you quality time with our providers, and arriving late affects you and other patients whose appointments are after yours.  Also, if you no show three or more times for appointments you may be dismissed from the clinic at the providers discretion.     Again, thank you for choosing [facility].  Our hope is that these requests will decrease the amount of time that you wait before being seen by our physicians.       _____________________________________________________________  Should you have questions after your visit to [facility], please contact our office at [phone] between the hours of 8:00 a.m. and 4:30 p.m.  Voicemails left after 4:00 p.m. will not be returned until the following business day.  For prescription refill requests, have your pharmacy contact our office and allow 72 hours.    Cancer Center Support Programs:   > Cancer Support Group  2nd Tuesday of the month 1pm-2pm, Journey Room    

## 2019-09-02 NOTE — Assessment & Plan Note (Signed)
1.  Microcytic anemia: -He had microcytic anemia since 2009, with hemoglobin between 11 and 12. -MCV has been between 66 and 70.  He is on Niferex 150 mg daily. -Colonoscopy on 12/05/2018 shows 1 5 mm polyp in the cecum.  Otherwise normal exam.  Pathology showed tubular adenoma. -EGD on 07/10/2019 shows normal esophagus, stomach, normal duodenal bulb and second part of duodenum. -No clear history of bleeding per rectum or melena.  Work-up including ferritin, iron panel, Q58, folic acid, methylmalonic acid and copper levels were normal. -No intervention needed at this time.  We will repeat his CBC in 3 months.  I will check for hemoglobin electrophoresis at that time to rule out thalassemia trait.  2.  Thrombocytopenia: -He has mild thrombocytopenia since 2009. -No bleeding history including nosebleeds, hematuria reported.  No easy bruising. -CT scan of abdomen December 2019 shows hepatic steatosis.  Craniocaudal spleen size is measured at 13 cm, slightly more than upper limit of normal. -Differential diagnosis for mild thrombocytopenia includes immune mediated thrombocytopenia versus splenomegaly. -Nutritional work-up and infectious etiology was negative.  H. pylori IgA level is elevated with normal IgG, nonspecific.  We will plan to repeat his platelet count in 3 months. -If there is significant worsening, consider bone marrow biopsy.

## 2019-09-02 NOTE — Progress Notes (Signed)
Cole Stewart, [place] [address]   CLINIC:  Medical Oncology/Hematology  PCP:  Monico Blitz, [place] [address] [phone]   REASON FOR VISIT:  Follow-up for microcytic anemia and thrombocytopenia.  CURRENT THERAPY: Under work-up.   INTERVAL HISTORY:  Cole Stewart 43 y.o. male seen for follow-up of microcytic anemia and thrombocytopenia.  He lives in a group home and is accompanied by his caretaker.  No bleeding per rectum or melena reported.  No fevers, night sweats or weight loss reported.  Appetite is 100%.  No recent ER visits or hospitalizations.    REVIEW OF SYSTEMS:  Review of Systems  All other systems reviewed and are negative.    PAST MEDICAL/SURGICAL HISTORY:  Past Medical History:  Diagnosis Date  . Anemia   . GERD (gastroesophageal reflux disease)   . Mental retardation   . Mood disorder ([place])   . MVP (mitral valve prolapse)   . Recurrent abdominal pain   . Seizures ([place])    last seizure was 2010.Unknown etiology  . Thrombocytopenia ([place])    Past Surgical History:  Procedure Laterality Date  . CHOLECYSTECTOMY    . COLONOSCOPY WITH PROPOFOL N/A 12/05/2018   PROPOFOL;  Surgeon: Daneil Dolin, MD;  One 5 mm polyp in the cecum.  Tubular adenoma.  Due for repeat in 2025.  Marland Kitchen ESOPHAGOGASTRODUODENOSCOPY (EGD) WITH PROPOFOL N/A 07/10/2019   PROPOFOL;  Surgeon: Daneil Dolin, MD; normal exam  . POLYPECTOMY  12/05/2018   Procedure: POLYPECTOMY;  Surgeon: Daneil Dolin, MD;  [facility];  Service: Endoscopy;;  colon     SOCIAL HISTORY:  Social History   Socioeconomic History  . Marital status: Single    Spouse name: Not on file  . Number of children: 0  . Years of education: Not on file  . Highest education level: Not on file  Occupational History  . Not on file  Social Needs  . Financial resource strain: Not on file  . Food insecurity    Worry: Not on file    Inability: Not on file   . Transportation needs    Medical: Not on file    Non-medical: Not on file  Tobacco Use  . Smoking status: Never Smoker  . Smokeless tobacco: Never Used  Substance and Sexual Activity  . Alcohol use: No  . Drug use: No  . Sexual activity: Never  Lifestyle  . Physical activity    Days per week: Not on file    Minutes per session: Not on file  . Stress: Not on file  Relationships  . Social Herbalist on phone: Not on file    Gets together: Not on file    Attends religious service: Not on file    Active member of club or organization: Not on file    Attends meetings of clubs or organizations: Not on file    Relationship status: Not on file  . Intimate partner violence    Fear of current or ex partner: Not on file    Emotionally abused: Not on file    Physically abused: Not on file    Forced sexual activity: Not on file  Other Topics Concern  . Not on file  Social History Narrative  . Not on file    FAMILY HISTORY:  History reviewed. No pertinent family history.  CURRENT MEDICATIONS:  Outpatient Encounter Medications as of 09/02/2019  Medication Sig  .  chlorhexidine (PERIDEX) 0.12 % solution Use as directed 15 mLs in the mouth or throat 2 (two) times daily.   . clonazePAM (KLONOPIN) 0.5 MG tablet Take 0.25-0.5 mg by mouth See admin instructions. Take 0.25 mg by mouth in the morning and take 0.5 mg at noon and at bedtime  . DEXILANT 60 MG capsule TAKE 1 CAPSULE BY MOUTH ONCE A DAY. (Patient taking differently: Take 60 mg by mouth daily. )  . divalproex (DEPAKOTE) 250 MG DR tablet Take 250-500 mg by mouth See admin instructions. Take 250 mg by mouth at 0700 and take 250 mg by mouth at 1200. Take 500 mg by mouth at bedtime 2000.  Marland Kitchen doxepin (SINEQUAN) 10 MG capsule Take 10 mg by mouth at bedtime.  . famotidine (PEPCID) 20 MG tablet TAKE 1 TABLET BY MOUTH AT BEDTIME.  Marland Kitchen gabapentin (NEURONTIN) 400 MG capsule Take 400-1,200 mg by mouth See admin instructions. Take 400 mg  by mouth in the morning and take 1200 mg by mouth at bedtime  . Iloperidone (FANAPT) 6 MG TABS Take 6 mg by mouth daily.   . iron polysaccharides (NIFEREX) 150 MG capsule Take 150 mg by mouth daily.  Marland Kitchen ketoconazole (NIZORAL) 2 % shampoo Apply 1 application topically 2 (two) times a week. *Lather, leave for 5 minutes, then rinse*  . Olopatadine HCl (PATADAY) 0.2 % SOLN Place 1 drop into both eyes daily.   . Omega-3 Fatty Acids (FISH OIL OMEGA-3) 1000 MG CAPS Take 2,000 mg by mouth daily.  . polyethylene glycol powder (MIRALAX) 17 GM/SCOOP powder Take 17 g by mouth daily as needed for mild constipation.  . QUEtiapine (SEROQUEL XR) 400 MG 24 hr tablet Take 400 mg by mouth at bedtime.  . Vitamin D, Ergocalciferol, (DRISDOL) 1.25 MG (50000 UT) CAPS capsule Take 50,000 Units by mouth every 30 (thirty) days.  . vitamin E 200 UNIT capsule Take 200 Units by mouth every morning.  . zolpidem (AMBIEN) 5 MG tablet Take 5 mg by mouth at bedtime.    No facility-administered encounter medications on file as of 09/02/2019.     ALLERGIES:  No Known Allergies   PHYSICAL EXAM:  ECOG Performance status: 1  Vitals:   09/02/19 1400  BP: 116/76  Pulse: 88  Resp: 15  Temp: (!) 97.3 F (36.3 C)  SpO2: 100%   Filed Weights   09/02/19 1400  Weight: 149 lb 1 oz (67.6 kg)    Physical Exam Vitals signs reviewed.  Constitutional:      Appearance: Normal appearance.  Cardiovascular:     Rate and Rhythm: Normal rate and regular rhythm.     Heart sounds: Normal heart sounds.  Pulmonary:     Effort: Pulmonary effort is normal.     Breath sounds: Normal breath sounds.  Abdominal:     General: There is no distension.     Palpations: Abdomen is soft. There is no mass.  Musculoskeletal:        General: No swelling.  Skin:    General: Skin is warm.  Neurological:     General: No focal deficit present.     Mental Status: He is alert and oriented to person, place, and time.  Psychiatric:        Mood  and Affect: Mood normal.        Behavior: Behavior normal.      LABORATORY DATA:  I have reviewed the labs as listed.  CBC    Component Value Date/Time   WBC 6.9  07/18/2019 0925   RBC 5.50 07/18/2019 0925   HGB 11.1 (L) 07/18/2019 0925   HCT 38.9 (L) 07/18/2019 0925   PLT 118 (L) 07/18/2019 0925   MCV 70.7 (L) 07/18/2019 0925   MCH 20.2 (L) 07/18/2019 0925   MCHC 28.5 (L) 07/18/2019 0925   RDW 18.5 (H) 07/18/2019 0925   LYMPHSABS 3.2 07/18/2019 0925   MONOABS 0.5 07/18/2019 0925   EOSABS 0.5 07/18/2019 0925   BASOSABS 0.0 07/18/2019 0925   CMP Latest Ref Rng & Units 07/18/2019 10/23/2018 03/26/2013  Glucose 70 - 99 mg/dL 90 92 82  BUN 6 - 20 mg/dL _0 Creatinine 0.61 - 1.24 mg/dL 0.59(L) 0.70 0.55  Sodium 135 - 145 mmol/L 139 139 137  Potassium 3.5 - 5.1 mmol/L 3.9 4.2 4.1  Chloride 98 - 111 mmol/L 105 103 103  CO2 22 - 32 mmol/L _1 Calcium 8.9 - 10.3 mg/dL 8.8(L) 9.3 8.0(L)  Total Protein 6.5 - 8.1 g/dL 6.8 7.0 6.2  Total Bilirubin 0.3 - 1.2 mg/dL 0.8 0.9 0.4  Alkaline Phos 38 - 126 U/L 66 - 70  AST 15 - 41 U/L _2 ALT 0 - 44 U/L 23 31 32       DIAGNOSTIC IMAGING:  I have independently reviewed the scans and discussed with the patient.    ASSESSMENT & PLAN:   Microcytic anemia 1.  Microcytic anemia: -He had microcytic anemia since 2009, with hemoglobin between 11 and 12. -MCV has been between 66 and 70.  He is on Niferex 150 mg daily. -Colonoscopy on 12/05/2018 shows 1 5 mm polyp in the cecum.  Otherwise normal exam.  Pathology showed tubular adenoma. -EGD on 07/10/2019 shows normal esophagus, stomach, normal duodenal bulb and second part of duodenum. -No clear history of bleeding per rectum or melena.  Work-up including ferritin, iron panel, K27, folic acid, methylmalonic acid and copper levels were normal. -No intervention needed at this time.  We will repeat his CBC in 3 months.  I will check for hemoglobin electrophoresis at that time to  rule out thalassemia trait.  2.  Thrombocytopenia: -He has mild thrombocytopenia since 2009. -No bleeding history including nosebleeds, hematuria reported.  No easy bruising. -CT scan of abdomen December 2019 shows hepatic steatosis.  Craniocaudal spleen size is measured at 13 cm, slightly more than upper limit of normal. -Differential diagnosis for mild thrombocytopenia includes immune mediated thrombocytopenia versus splenomegaly. -Nutritional work-up and infectious etiology was negative.  H. pylori IgA level is elevated with normal IgG, nonspecific.  We will plan to repeat his platelet count in 3 months. -If there is significant worsening, consider bone marrow biopsy.      Orders placed this encounter:  Orders Placed This Encounter  Procedures  . Hemoglobinopathy evaluation  . CBC with Differential      Derek Jack, MD [place] [phone]

## 2019-09-11 ENCOUNTER — Telehealth: Payer: Self-pay

## 2019-09-11 NOTE — Telephone Encounter (Signed)
Noted  

## 2019-09-11 NOTE — Telephone Encounter (Signed)
Noted. I am reaching out to Dr. Delton Coombes who ordered the H. Pylori panel to see if he had plans to treat the patient. Further recommendations to follow.

## 2019-09-11 NOTE — Telephone Encounter (Signed)
Cole Stewart,   I called the group home and spoke to pt's manager, [place].  Pt has been with them for 8 plus years.  She said she is not aware of him ever being treated for H pylori.

## 2019-09-16 ENCOUNTER — Other Ambulatory Visit: Payer: Self-pay | Admitting: Gastroenterology

## 2019-09-16 NOTE — Telephone Encounter (Signed)
Patient had an H. Pylori panel drawn with elevated IgA but normal IgG and IgM. Discussed with Dr. Delton Coombes who was not impressed by the H. Pylori IgA elevation. Spoke with Dr. Gala Romney regarding this as patient has continued to have intermittent epigastric pain without identified cause. Would not recommend empiric treatment for H. Pylori at this point. This IgA elevation is somewhat nonspecific as he recently had a completely normal EGD. If patient is able to tolerate it, would recommend stopping all acid suppression medication (Dexilant, Pepcid, and any other he may have as needed such as TUMS etc) for 2 weeks followed by H. Pylori stool testing so we can get a clear picture whether patient is in need of treatment of H. Pylori.   Also, as recommended at last visit, it will be very important for nursing staff to keep a record of when patient is experiencing the abdominal pain, where it is located, and any details related to what was going on around the time of the pain starting. They will also need to keep a record of his bowel habits as it seemed that he was likely constipated at last visit but was difficult to get a clear clinical picture. These records need to be sent with patient at next visit.

## 2019-09-22 ENCOUNTER — Other Ambulatory Visit: Payer: Self-pay

## 2019-09-22 DIAGNOSIS — R109 Unspecified abdominal pain: Secondary | ICD-10-CM

## 2019-09-22 NOTE — Telephone Encounter (Signed)
Pt's caregiver, [name], is aware. She said he should be able to stop the anti acids for 2 weeks and then do the stool test. I will mail her the orders and she is also aware to keep details related to BM's.

## 2019-10-15 ENCOUNTER — Telehealth: Payer: Self-pay

## 2019-10-15 NOTE — Telephone Encounter (Signed)
Noted  

## 2019-10-15 NOTE — Telephone Encounter (Signed)
T/C from Angie at the facility where pt resides ( she is the administrator):  She said they have the letter and will start holding the [place] today and wait 14 days to do the test and them resume the [place] after the sample is submitted.   FYI to [employer], [place].

## 2019-11-21 DIAGNOSIS — F419 Anxiety disorder, unspecified: Secondary | ICD-10-CM | POA: Diagnosis not present

## 2019-11-24 DIAGNOSIS — Z789 Other specified health status: Secondary | ICD-10-CM | POA: Diagnosis not present

## 2019-11-24 DIAGNOSIS — G40209 Localization-related (focal) (partial) symptomatic epilepsy and epileptic syndromes with complex partial seizures, not intractable, without status epilepticus: Secondary | ICD-10-CM | POA: Diagnosis not present

## 2019-11-24 DIAGNOSIS — F72 Severe intellectual disabilities: Secondary | ICD-10-CM | POA: Diagnosis not present

## 2019-11-24 DIAGNOSIS — I10 Essential (primary) hypertension: Secondary | ICD-10-CM | POA: Diagnosis not present

## 2019-11-24 DIAGNOSIS — Z299 Encounter for prophylactic measures, unspecified: Secondary | ICD-10-CM | POA: Diagnosis not present

## 2019-11-24 DIAGNOSIS — R569 Unspecified convulsions: Secondary | ICD-10-CM | POA: Diagnosis not present

## 2019-11-27 ENCOUNTER — Other Ambulatory Visit (HOSPITAL_COMMUNITY): Payer: Medicare Other

## 2019-12-04 ENCOUNTER — Ambulatory Visit (HOSPITAL_COMMUNITY): Payer: Medicare Other | Admitting: Hematology

## 2019-12-08 ENCOUNTER — Ambulatory Visit: Payer: Medicare Other | Admitting: Gastroenterology

## 2019-12-09 ENCOUNTER — Inpatient Hospital Stay (HOSPITAL_COMMUNITY): Payer: Medicare Other | Attending: Hematology

## 2019-12-09 NOTE — Progress Notes (Signed)
Referring Provider: Monico Blitz, MD Primary Care Physician:  Monico Blitz, MD Primary GI Physician: Dr. Gala Romney  Chief Complaint  Patient presents with   Follow-up    HPI:   Cole Stewart is a 44 y.o. male who is mentally challenged witha GI history significant for cholecystectomy, GERD, chronic abdominal pain, and diarrhea but more recently with constipation. Also with history of microcytic anemia with no evidence if IDA and thrombocytopenia now following with hematology with suspicion for possible thalassemia trait.  Planning for hemoglobin electrophoresis in the future.  Colonoscopy up-to-date in 2020 with 1 tubular adenoma.  Due for repeat in 2025.  EGD in September 2020 completely normal.  Prior CT abdomen and pelvis in December 2019 for abdominal pain and weight loss without acute GI abnormalities.  He was last seen in our office on 08/28/2019 for follow-up of abdominal pain.  Caregiver reported epigastric pain seemed to be improving some since adding Pepcid at night to daily Dexilant.  Felt symptoms were occurring a few times a week but was unable to give details as to when abdominal pain occurs or any potential triggers.  Question of underlying constipation.  Caregiver reported typically stools are [place] 2.  He was without alarm symptoms.  Was also noted that Dr. Delton Coombes checked H. pylori serologies with H. pylori IgA antibody elevated at 19.5 (normal <8.9)  on 07/18/2019.  Query whether constipation may be contributing to epigastric pain.  Was unsure of the significance of elevated H. pylori IgA antibody.  Plans were to optimize bowel habits by adding MiraLAX 1 capful daily, continue Dexilant daily, Pepcid nightly.  Requested for facility to keep log of patient's epigastric pain with associated timing and possible triggers.  Also requested they keep a log of his bowel habits and bring these to his next visit.   Spoke with Dr. Delton Coombes about H. pylori result.  He did not treat  patient for H. pylori.  Discussed this with Dr. Buford Dresser who did not recommend empiric treatment for H. pylori.  Stated IgA elevation is somewhat nonspecific with recent EGD that was completely normal.  Recommended stopping all acid suppression medications for 2 weeks followed by H. pylori stool testing to determine whether patient truly needs treatment of H. pylori.  Patient's caregiver, Cole Stewart, was noted and said they would hold acid suppression medications and complete stool testing as recommended.  As far as I can tell, stool testing has not been completed.  Today: Presents with caretaker who provides majority of the history. This care taker has only been working with patient since December. Doesn't think stool test for H. pylori was completed. Not taking MiraLAX daily. About 4-5 times a week. BMs daily to every other day. Sometimes passing small balls of stool. Straining at times. Sometimes with diarrhea and has had incontinence at times due to urgency.  Diarrhea 2-3 days at a time then stools returned to soft and formed.  No blood in the stool.no black stools.  Notes patient does complain of abdominal pain associated with diarrhea at times.  Cereal with milk 2-3 times a week. No regular ice cream or cheese.  Bread frequently.   Patient repeatedly pointing to his epigastric and right upper quadrant area visit.  Caretaker reports patient complains of abdominal pain daily several times a day. Aid states sometimes patient reports abdomen is hurting and he has to have a BM and is straining. Other times when he is having diarrhea. Frequently, will be pointing to his upper abdomen  when he is hungry. States this is how he communicates that he is hungry to them. Doesn't tend to complain of pain after eating. Occasional self induced vomiting. Aid states patient will stick his hand down his throat to make himself vomit in order to get food.  Not complaining of abdominal pain at this time.  This behavior has at least  been present since caregiver started caring for patient in December. Not sure if it was present prior to this. No hematemesis. No weight loss.  Patient remains very active.  No reflux or heartburn. No dysphagia.     Past Medical History:  Diagnosis Date   Anemia    GERD (gastroesophageal reflux disease)    Mental retardation    Mood disorder ([place])    MVP (mitral valve prolapse)    Recurrent abdominal pain    Seizures ([place])    last seizure was 2010.Unknown etiology   Thrombocytopenia ([place])     Past Surgical History:  Procedure Laterality Date   CHOLECYSTECTOMY     COLONOSCOPY WITH PROPOFOL N/A 12/05/2018   PROPOFOL;  Surgeon: Daneil Dolin, MD;  One 5 mm polyp in the cecum.  Tubular adenoma.  Due for repeat in 2025.   ESOPHAGOGASTRODUODENOSCOPY (EGD) WITH PROPOFOL N/A 07/10/2019   PROPOFOL;  Surgeon: Daneil Dolin, MD; normal exam   POLYPECTOMY  12/05/2018   Procedure: POLYPECTOMY;  Surgeon: Daneil Dolin, MD;  Location: [facility];  Service: Endoscopy;;  colon    Current Outpatient Medications  Medication Sig Dispense Refill   chlorhexidine (PERIDEX) 0.12 % solution Use as directed 15 mLs in the mouth or throat 2 (two) times daily.      clonazePAM (KLONOPIN) 0.5 MG tablet Take 0.25-0.5 mg by mouth See admin instructions. Take 0.25 mg by mouth in the morning and take 0.5 mg at noon and at bedtime     DEXILANT 60 MG capsule TAKE 1 CAPSULE BY MOUTH ONCE A DAY. (Patient taking differently: Take 60 mg by mouth daily. ) 30 capsule 11   divalproex (DEPAKOTE) 250 MG DR tablet Take 250-500 mg by mouth See admin instructions. Take 250 mg by mouth at 0700 and take 250 mg by mouth at 1200. Take 500 mg by mouth at bedtime 2000.     doxepin (SINEQUAN) 10 MG capsule Take 10 mg by mouth at bedtime.     famotidine (PEPCID) 20 MG tablet TAKE 1 TABLET BY MOUTH AT BEDTIME. 30 tablet 11   gabapentin (NEURONTIN) 400 MG capsule Take 400-1,200 mg by mouth See admin instructions.  Take 400 mg by mouth in the morning and take 1200 mg by mouth at bedtime     Iloperidone (FANAPT) 6 MG TABS Take 6 mg by mouth daily.      iron polysaccharides (NIFEREX) 150 MG capsule Take 150 mg by mouth daily.     ketoconazole (NIZORAL) 2 % shampoo Apply 1 application topically 2 (two) times a week. *Lather, leave for 5 minutes, then rinse*     Olopatadine HCl (PATADAY) 0.2 % SOLN Place 1 drop into both eyes daily.      Omega-3 Fatty Acids (FISH OIL OMEGA-3) 1000 MG CAPS Take 2,000 mg by mouth daily.     polyethylene glycol powder (MIRALAX) 17 GM/SCOOP powder Take 17 g by mouth daily as needed for mild constipation. 255 g 3   QUEtiapine (SEROQUEL XR) 400 MG 24 hr tablet Take 400 mg by mouth at bedtime.     Vitamin D, Ergocalciferol, (DRISDOL) 1.25  MG (50000 UT) CAPS capsule Take 50,000 Units by mouth every 30 (thirty) days.     vitamin E 200 UNIT capsule Take 200 Units by mouth every morning.     zolpidem (AMBIEN) 5 MG tablet Take 5 mg by mouth at bedtime.      No current facility-administered medications for this visit.    Allergies as of 12/10/2019   (No Known Allergies)    History reviewed. No pertinent family history.  Social History   Socioeconomic History   Marital status: Single    Spouse name: Not on file   Number of children: 0   Years of education: Not on file   Highest education level: Not on file  Occupational History   Not on file  Tobacco Use   Smoking status: Never Smoker   Smokeless tobacco: Never Used  Substance and Sexual Activity   Alcohol use: No   Drug use: No   Sexual activity: Never  Other Topics Concern   Not on file  Social History Narrative   Not on file   Social Determinants of Health   Financial Resource Strain:    Difficulty of Paying Living Expenses: Not on file  Food Insecurity:    Worried About [place] in the Last Year: Not on file   Ran Out of Food in the Last Year: Not on file  Transportation  Needs:    Lack of Transportation (Medical): Not on file   Lack of Transportation (Non-Medical): Not on file  Physical Activity:    Days of Exercise per Week: Not on file   Minutes of Exercise per Session: Not on file  Stress:    Feeling of Stress : Not on file  Social Connections:    Frequency of Communication with Friends and Family: Not on file   Frequency of Social Gatherings with Friends and Family: Not on file   Attends Religious Services: Not on file   Active Member of Clubs or Organizations: Not on file   Attends Archivist Meetings: Not on file   Marital Status: Not on file    Review of Systems: Gen: Denies fever, chills, presyncope or syncope. CV: Denies chest pain or heart palpitations  Resp: Denies shortness of breath or cough. GI: See HPI Derm: Denies rash Heme: Denies bruising or bleeding.  Physical Exam: BP (!) 143/77    Pulse 87    Temp (!) 96.9 F (36.1 C) (Oral)    Ht 5\' 2"  (1.575 m)    Wt 150 lb 3.2 oz (68.1 kg)    BMI 27.47 kg/m  General:   Alert. No distress noted. Pleasant and cooperative.  Head:  Normocephalic and atraumatic. Eyes:  Conjuctiva clear without scleral icterus. Heart:  S1, S2 present without murmurs appreciated. Lungs:  Clear to auscultation bilaterally. No wheezes, rales, or rhonchi. No distress.  Abdomen:  +BS, soft, non-tender and non-distended. No rebound or guarding. No HSM or masses noted. Msk:  Symmetrical without gross deformities. Normal posture. Extremities:  Without edema. Neurologic: Unable to assess orientation due mental handicap and patient being noncommunicative. Psych:  Normal mood and affect.

## 2019-12-10 ENCOUNTER — Encounter: Payer: Self-pay | Admitting: Gastroenterology

## 2019-12-10 ENCOUNTER — Other Ambulatory Visit: Payer: Self-pay

## 2019-12-10 ENCOUNTER — Ambulatory Visit (INDEPENDENT_AMBULATORY_CARE_PROVIDER_SITE_OTHER): Payer: Medicare Other | Admitting: Gastroenterology

## 2019-12-10 VITALS — BP 143/77 | HR 87 | Temp 96.9°F | Ht 62.0 in | Wt 150.2 lb

## 2019-12-10 DIAGNOSIS — R198 Other specified symptoms and signs involving the digestive system and abdomen: Secondary | ICD-10-CM

## 2019-12-10 DIAGNOSIS — K219 Gastro-esophageal reflux disease without esophagitis: Secondary | ICD-10-CM | POA: Diagnosis not present

## 2019-12-10 DIAGNOSIS — R1013 Epigastric pain: Secondary | ICD-10-CM | POA: Diagnosis not present

## 2019-12-10 MED ORDER — DICYCLOMINE HCL 10 MG PO CAPS: ORAL_CAPSULE | ORAL | 5 refills | Status: DC

## 2019-12-10 NOTE — Patient Instructions (Addendum)
Hold Dexilant, Pepcid, and any other acid suppression medications for 14 days and complete H. pylori stool antigen test.  Resume Dexilant and Pepcid after completing the test.  Continue MiraLAX 1 capful daily for constipation.  Hold in the setting of diarrhea.  Add Bentyl 10 mg as needed for abdominal pain/diarrhea.  This may be taken up to 3 times daily prior to meals and at bedtime as needed.  Hold in the setting of constipation.  Try following a gluten-free diet.  Switch to a lactose-free milk for cereal.  Avoid other dairy products.  Continue to monitor symptoms.   We will plan to follow-up in 4 months with Dr. Gala Romney. Call if questions or concerns prior.   Aliene Altes, PA-C [facility]

## 2019-12-11 ENCOUNTER — Encounter: Payer: Self-pay | Admitting: Gastroenterology

## 2019-12-11 NOTE — Assessment & Plan Note (Addendum)
Typical GERD symptoms are well controlled on Dexilant daily and Pepcid nightly.  He does have chronic abdominal pain as discussed below.  Due to this, we will need to hold all acid suppression medications x2 weeks and complete H. pylori stool antigen test.  He will resume Dexilant and Pepcid after completion of stool test.

## 2019-12-11 NOTE — Assessment & Plan Note (Addendum)
44 year old male who is essentially noncommunicative with chronic epigastric/right upper quadrant abdominal pain without identified etiology.  Pain has been present at least since 2019.  GERD symptoms are well controlled on Dexilant and Pepcid daily.  Prior CT abdomen and pelvis in 2019 unrevealing.  Celiac testing in 2019 only with mildly elevated Gladin IgA. TCS in January 2020 with 1 tubular adenoma, due for repeat in 2025.  EGD in September 2020 normal.  At last visit, suspected underlying constipation may be playing a role and started MiraLAX daily.  Was also noted Dr. Delton Coombes checked H. pylori serologies in September 2020 and H. pylori IgA antibody was elevated at 19.5 (normal <8.9).  Patient was not treated for H. pylori.  Plans were to stop all acid suppression medications for 14 days and complete H. pylori stool antigen test but this was not completed.  Patient presents with different caregiver today who has been with him since December 2020.  Taking MiraLAX 3-4 times a week and bowel movements range from small hard stools to a couple days of diarrhea. Per caregiver, patient complains of abdominal pain daily but also notes this is the way he communicates with them that he is hungry.  After eating, he does not complain of pain.  Patient also complains of pain in the setting of having to strain with BMs as well as in the setting of diarrhea.  Occasional self-induced vomiting in order to get more food, not complaining of pain at that time.  No hematemesis.  No weight loss.  No bright red blood per rectum or melena.  Abdominal exam is again completely benign today.  History of cholecystectomy.  Differentials include functional dyspepsia, IBS, undiagnosed H. pylori, or may be also be a behavior that is being misinterpreted as pain.    Need to rule out H. pylori.  Advised to stop all acid suppression medications for 14 days and complete H. pylori stool antigen test.  Resume Dexilant and Pepcid after  completion of stool test. Start Bentyl 10 mg up to 3 times daily and at bedtime as needed for abdominal pain/diarrhea.  Hold in the setting of constipation. Continue MiraLAX 1 capful (17 g) daily in 8 ounces of water.  Hold in the setting of diarrhea. Trial of gluten-free diet. Use lactose-free milk for cereal.  Continue to avoid other dairy products. Continue to monitor. Follow-up in 4 months with Dr. Gala Romney.

## 2019-12-11 NOTE — Assessment & Plan Note (Addendum)
Per caregiver, bowel habits fluctuate from small hard stools to diarrhea for a couple days at a time.  Historically, he was thought to have frequent diarrhea, celiac evaluation with only mildly elevated Gladin IgA. At last visit it was felt he was struggling more with constipation and was started on MiraLAX daily.  Currently taking MiraLAX 4-5 days a week.  Also with chronic intermittent abdominal pain present at least since 2019 without clear etiology.  GERD symptoms are well managed.  CT abdomen and pelvis in December 2019 unrevealing.  TCS January 2020 with 1 tubular adenoma due for repeat in 2025.  EGD in September 2020 normal.  Per caregiver, patient does report some abdominal pain when having to strain with bowel movements or in the setting of diarrhea.  At this point, query whether patient has some underlying IBS symptoms contributing to his chronic abdominal pain and alternating bowel habits.  It is possible he has some underlying food intolerances to gluten or dairy.  Continue MiraLAX 1 capful (17 g) in 8 ounces of water daily.  Hold in the setting of diarrhea. Start Bentyl 10 mg up to 3 times daily and at bedtime as needed for diarrhea/abdominal pain.  Hold in the setting of constipation. Try following a gluten-free diet. Start using lactose-free milk for cereal.  Continue avoiding other dairy products. Follow-up in 4 months.

## 2019-12-12 ENCOUNTER — Telehealth: Payer: Self-pay | Admitting: Internal Medicine

## 2019-12-12 NOTE — Telephone Encounter (Signed)
[phone]  PATIENT CARE HOME HAS A QUESTION ABOUT HIS MEDICATIONS.  SAID THAT HE IS ALREADY ON ONE THAT WAS PRESCRIBED

## 2019-12-12 NOTE — Telephone Encounter (Signed)
Tried calling the home of pt. No VM is set up. Will call back.

## 2019-12-15 ENCOUNTER — Telehealth: Payer: Self-pay | Admitting: Internal Medicine

## 2019-12-15 NOTE — Progress Notes (Signed)
Cc'ed to pcp °

## 2019-12-15 NOTE — Telephone Encounter (Signed)
Returning call to AM. Please call [phone]

## 2019-12-15 NOTE — Telephone Encounter (Signed)
Spoke with pts case Freight forwarder. They wanted to discuss the medication he was suppose to hold before taking the H. Pylori test. Pt was advised to hold Dexilant 60 mg and Pepcid 20 mg for 14 days, take test and resume when test is complete.

## 2019-12-16 ENCOUNTER — Inpatient Hospital Stay (HOSPITAL_COMMUNITY): Payer: Medicare Other | Attending: Hematology

## 2019-12-16 ENCOUNTER — Other Ambulatory Visit: Payer: Self-pay

## 2019-12-16 ENCOUNTER — Ambulatory Visit (HOSPITAL_COMMUNITY): Payer: Medicare Other | Admitting: Hematology

## 2019-12-16 ENCOUNTER — Ambulatory Visit (HOSPITAL_COMMUNITY): Payer: Medicare Other | Admitting: Nurse Practitioner

## 2019-12-16 DIAGNOSIS — D696 Thrombocytopenia, unspecified: Secondary | ICD-10-CM | POA: Diagnosis not present

## 2019-12-16 DIAGNOSIS — D509 Iron deficiency anemia, unspecified: Secondary | ICD-10-CM | POA: Insufficient documentation

## 2019-12-16 LAB — CBC WITH DIFFERENTIAL/PLATELET
Abs Immature Granulocytes: 0.02 10*3/uL (ref 0.00–0.07)
Basophils Absolute: 0 10*3/uL (ref 0.0–0.1)
Basophils Relative: 0 %
Eosinophils Absolute: 0.2 10*3/uL (ref 0.0–0.5)
Eosinophils Relative: 2 %
HCT: 38.8 % — ABNORMAL LOW (ref 39.0–52.0)
Hemoglobin: 11.2 g/dL — ABNORMAL LOW (ref 13.0–17.0)
Immature Granulocytes: 0 %
Lymphocytes Relative: 43 %
Lymphs Abs: 3.7 10*3/uL (ref 0.7–4.0)
MCH: 20.6 pg — ABNORMAL LOW (ref 26.0–34.0)
MCHC: 28.9 g/dL — ABNORMAL LOW (ref 30.0–36.0)
MCV: 71.2 fL — ABNORMAL LOW (ref 80.0–100.0)
Monocytes Absolute: 0.5 10*3/uL (ref 0.1–1.0)
Monocytes Relative: 6 %
Neutro Abs: 4.2 10*3/uL (ref 1.7–7.7)
Neutrophils Relative %: 49 %
Platelets: 111 10*3/uL — ABNORMAL LOW (ref 150–400)
RBC: 5.45 MIL/uL (ref 4.22–5.81)
RDW: 19.8 % — ABNORMAL HIGH (ref 11.5–15.5)
WBC: 8.6 10*3/uL (ref 4.0–10.5)
nRBC: 0.3 % — ABNORMAL HIGH (ref 0.0–0.2)

## 2019-12-17 LAB — HEMOGLOBINOPATHY EVALUATION
Hgb A2 Quant: 3.6 % — ABNORMAL HIGH (ref 1.8–3.2)
Hgb A: 95.2 % — ABNORMAL LOW (ref 96.4–98.8)
Hgb C: 0 %
Hgb F Quant: 1.2 % (ref 0.0–2.0)
Hgb S Quant: 0 %
Hgb Variant: 0 %

## 2019-12-25 ENCOUNTER — Inpatient Hospital Stay (HOSPITAL_BASED_OUTPATIENT_CLINIC_OR_DEPARTMENT_OTHER): Payer: Medicare Other | Admitting: Nurse Practitioner

## 2019-12-25 ENCOUNTER — Other Ambulatory Visit: Payer: Self-pay

## 2019-12-25 DIAGNOSIS — D509 Iron deficiency anemia, unspecified: Secondary | ICD-10-CM | POA: Diagnosis not present

## 2019-12-26 NOTE — Assessment & Plan Note (Signed)
1.  Microcytic anemia: -He had microcytic anemia since 2009, with hemoglobin between 11 and 12. -MCV has been between 66 and 70.  He is on Niferex 150 mg daily. -Colonoscopy on 12/05/2018 showed 1.5 mm polyp in the cecum.  Otherwise normal exam.  Pathology showed tubular adenoma. -EGD on 07/10/2019 showed normal esophagus, stomach, normal duodenal bulb and second part of duodenum. -No clear history of bleeding per rectum or melena. -Work-up including ferritin, iron panel, B12, folic acid, methylmalonic acid and copper levels were all normal.  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 -Labs done on 12/16/2019 showed hemoglobin 11.2 stable. -No intervention needed at this time. -He will follow-up in 4 months with repeat labs.  2.  Thrombocytopenia: -He has mild thrombocytopenia since 2009. -No bleeding history including nosebleeds, hematuria or hematochezia reported.  No easy bruising. -CT scan of abdomen 10/2018 showed hepatic steatosis.  Craniocaudal spleen size is measured at 13 cm, slightly more than upper limit of normal. -Differential diagnosis for mild thrombocytopenia include immune mediated thrombocytopenia versus splenomegaly. -Nutritional work-up and infectious etiology was negative.  H. pylori IgA level was elevated with normal IgG, nonspecific. -If there is significant worsening of platelets we will consider bone marrow biopsy. -Labs done on 12/16/2019 showed platelets 111. -He will follow-up in 4 months with repeat labs. 

## 2019-12-26 NOTE — Progress Notes (Signed)
[place], [place] [address]   CLINIC:  Medical Oncology/Hematology  PCP:  Monico Blitz, MD [place] [address] [phone]   REASON FOR VISIT: Follow-up for microcytic anemia and thrombocytopenia  CURRENT THERAPY: Observation   INTERVAL HISTORY:  Cole Stewart 44 y.o. male was called for a telephone interview today for microcytic anemia and thrombocytopenia.  Patient reports he is doing well since last visit.  He denies any bright red bleeding per rectum or melena.  He denies any easy bruising or bleeding. Denies any nausea, vomiting, or diarrhea. Denies any new pains. Had not noticed any recent bleeding such as epistaxis, hematuria or hematochezia. Denies recent chest pain on exertion, shortness of breath on minimal exertion, pre-syncopal episodes, or palpitations. Denies any numbness or tingling in hands or feet. Denies any recent fevers, infections, or recent hospitalizations. Patient reports appetite at 100% and energy level at 75%.  He is eating well maintain his weight at this time.    REVIEW OF SYSTEMS:  Review of Systems  All other systems reviewed and are negative.    PAST MEDICAL/SURGICAL HISTORY:  Past Medical History:  Diagnosis Date  . Anemia   . GERD (gastroesophageal reflux disease)   . Mental retardation   . Mood disorder ([place])   . MVP (mitral valve prolapse)   . Recurrent abdominal pain   . Seizures ([place])    last seizure was 2010.Unknown etiology  . Thrombocytopenia ([place])    Past Surgical History:  Procedure Laterality Date  . CHOLECYSTECTOMY    . COLONOSCOPY WITH PROPOFOL N/A 12/05/2018   PROPOFOL;  Surgeon: Daneil Dolin, MD;  One 5 mm polyp in the cecum.  Tubular adenoma.  Due for repeat in 2025.  Marland Kitchen ESOPHAGOGASTRODUODENOSCOPY (EGD) WITH PROPOFOL N/A 07/10/2019   PROPOFOL;  Surgeon: Daneil Dolin, MD; normal exam  . POLYPECTOMY  12/05/2018   Procedure: POLYPECTOMY;  Surgeon: Daneil Dolin, MD;   [facility];  Service: Endoscopy;;  colon     SOCIAL HISTORY:  Social History   Socioeconomic History  . Marital status: Single    Spouse name: Not on file  . Number of children: 0  . Years of education: Not on file  . Highest education level: Not on file  Occupational History  . Not on file  Tobacco Use  . Smoking status: Never Smoker  . Smokeless tobacco: Never Used  Substance and Sexual Activity  . Alcohol use: No  . Drug use: No  . Sexual activity: Never  Other Topics Concern  . Not on file  Social History Narrative  . Not on file   Social Determinants of Health   Financial Resource Strain:   . Difficulty of Paying Living Expenses: Not on file  Food Insecurity:   . Worried About Charity fundraiser in the Last Year: Not on file  . Ran Out of Food in the Last Year: Not on file  Transportation Needs:   . Lack of Transportation (Medical): Not on file  . Lack of Transportation (Non-Medical): Not on file  Physical Activity:   . Days of Exercise per Week: Not on file  . Minutes of Exercise per Session: Not on file  Stress:   . Feeling of Stress : Not on file  Social Connections:   . Frequency of Communication with Friends and Family: Not on file  . Frequency of Social Gatherings with Friends and Family: Not on file  .  Attends Religious Services: Not on file  . Active Member of Clubs or Organizations: Not on file  . Attends Archivist Meetings: Not on file  . Marital Status: Not on file  Intimate Partner Violence:   . Fear of Current or Ex-Partner: Not on file  . Emotionally Abused: Not on file  . Physically Abused: Not on file  . Sexually Abused: Not on file    FAMILY HISTORY:  No family history on file.  CURRENT MEDICATIONS:  Outpatient Encounter Medications as of 12/25/2019  Medication Sig  . chlorhexidine (PERIDEX) 0.12 % solution Use as directed 15 mLs in the mouth or throat 2 (two) times daily.   . clonazePAM (KLONOPIN) 0.5 MG  tablet Take 0.25-0.5 mg by mouth See admin instructions. Take 0.25 mg by mouth in the morning and take 0.5 mg at noon and at bedtime  . DEXILANT 60 MG capsule TAKE 1 CAPSULE BY MOUTH ONCE A DAY. (Patient taking differently: Take 60 mg by mouth daily. )  . dicyclomine (BENTYL) 10 MG capsule Take 1 capsule by mouth as needed for abdominal pain or diarrhea up to 3 times daily.  Hold in the setting of constipation.  . divalproex (DEPAKOTE) 250 MG DR tablet Take 250-500 mg by mouth See admin instructions. Take 250 mg by mouth at 0700 and take 250 mg by mouth at 1200. Take 500 mg by mouth at bedtime 2000.  Marland Kitchen doxepin (SINEQUAN) 10 MG capsule Take 10 mg by mouth at bedtime.  . famotidine (PEPCID) 20 MG tablet TAKE 1 TABLET BY MOUTH AT BEDTIME.  Marland Kitchen gabapentin (NEURONTIN) 400 MG capsule Take 400-1,200 mg by mouth See admin instructions. Take 400 mg by mouth in the morning and take 1200 mg by mouth at bedtime  . Iloperidone (FANAPT) 6 MG TABS Take 6 mg by mouth daily.   . iron polysaccharides (NIFEREX) 150 MG capsule Take 150 mg by mouth daily.  Marland Kitchen ketoconazole (NIZORAL) 2 % shampoo Apply 1 application topically 2 (two) times a week. *Lather, leave for 5 minutes, then rinse*  . Olopatadine HCl (PATADAY) 0.2 % SOLN Place 1 drop into both eyes daily.   . Omega-3 Fatty Acids (FISH OIL OMEGA-3) 1000 MG CAPS Take 2,000 mg by mouth daily.  . polyethylene glycol powder (MIRALAX) 17 GM/SCOOP powder Take 17 g by mouth daily as needed for mild constipation.  . QUEtiapine (SEROQUEL XR) 400 MG 24 hr tablet Take 400 mg by mouth at bedtime.  . Vitamin D, Ergocalciferol, (DRISDOL) 1.25 MG (50000 UT) CAPS capsule Take 50,000 Units by mouth every 30 (thirty) days.  . vitamin E 200 UNIT capsule Take 200 Units by mouth every morning.  . zolpidem (AMBIEN) 5 MG tablet Take 5 mg by mouth at bedtime.    No facility-administered encounter medications on file as of 12/25/2019.    ALLERGIES:  No Known Allergies   Vital signs:  -Deferred due to telephone visit  Physical Exam -Deferred due to telephone visit -Patient was alert and oriented over the phone and in no acute distress.  LABORATORY DATA:  I have reviewed the labs as listed.  CBC    Component Value Date/Time   WBC 8.6 12/16/2019 1221   RBC 5.45 12/16/2019 1221   HGB 11.2 (L) 12/16/2019 1221   HCT 38.8 (L) 12/16/2019 1221   PLT 111 (L) 12/16/2019 1221   MCV 71.2 (L) 12/16/2019 1221   MCH 20.6 (L) 12/16/2019 1221   MCHC 28.9 (L) 12/16/2019 1221   RDW 19.8 (  H) 12/16/2019 1221   LYMPHSABS 3.7 12/16/2019 1221   MONOABS 0.5 12/16/2019 1221   EOSABS 0.2 12/16/2019 1221   BASOSABS 0.0 12/16/2019 1221   CMP Latest Ref Rng & Units 07/18/2019 10/23/2018 03/26/2013  Glucose 70 - 99 mg/dL 90 92 82  BUN 6 - 20 mg/dL 12 13 7   Creatinine 0.61 - 1.24 mg/dL 0.59(L) 0.70 0.55  Sodium 135 - 145 mmol/L 139 139 137  Potassium 3.5 - 5.1 mmol/L 3.9 4.2 4.1  Chloride 98 - 111 mmol/L 105 103 103  CO2 22 - 32 mmol/L 26 30 24   Calcium 8.9 - 10.3 mg/dL 8.8(L) 9.3 8.0(L)  Total Protein 6.5 - 8.1 g/dL 6.8 7.0 6.2  Total Bilirubin 0.3 - 1.2 mg/dL 0.8 0.9 0.4  Alkaline Phos 38 - 126 U/L 66 - 70  AST 15 - 41 U/L 20 21 25   ALT 0 - 44 U/L 23 31 32   All questions were answered to patient's stated satisfaction. Encouraged patient to call with any new concerns or questions before his next visit to the cancer center and we can certain see him sooner, if needed.      ASSESSMENT & PLAN:   Microcytic anemia 1.  Microcytic anemia: -He had microcytic anemia since 2009, with hemoglobin between 11 and 12. -MCV has been between 66 and 70.  He is on Niferex 150 mg daily. -Colonoscopy on 12/05/2018 showed 1.5 mm polyp in the cecum.  Otherwise normal exam.  Pathology showed tubular adenoma. -EGD on 07/10/2019 showed normal esophagus, stomach, normal duodenal bulb and second part of duodenum. -No clear history of bleeding per rectum or melena. -Work-up including ferritin, iron panel,  J28, folic acid, methylmalonic acid and copper levels were all normal.  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 -Labs done on 12/16/2019 showed hemoglobin 11.2 stable. -No intervention needed at this time. -He will follow-up in 4 months with repeat labs.  2.  Thrombocytopenia: -He has mild thrombocytopenia since 2009. -No bleeding history including nosebleeds, hematuria or hematochezia reported.  No easy bruising. -CT scan of abdomen 10/2018 showed hepatic steatosis.  Craniocaudal spleen size is measured at 13 cm, slightly more than upper limit of normal. -Differential diagnosis for mild thrombocytopenia include immune mediated thrombocytopenia versus splenomegaly. -Nutritional work-up and infectious etiology was negative.  H. pylori IgA level was elevated with normal IgG, nonspecific. -If there is significant worsening of platelets we will consider bone marrow biopsy. -Labs done on 12/16/2019 showed platelets 111. -He will follow-up in 4 months with repeat labs.      Orders placed this encounter:  Orders Placed This Encounter  Procedures  . Lactate dehydrogenase  . CBC with Differential/Platelet  . Comprehensive metabolic panel  . Vitamin B12  . VITAMIN D 25 Hydroxy (Vit-D Deficiency, Fractures)     I provided 19  minutes of non face-to-face telephone visit time during this encounter, and > 50% was spent counseling as documented under my assessment & plan.    Francene Finders FNP-C [place] [phone]

## 2019-12-31 ENCOUNTER — Telehealth: Payer: Self-pay

## 2019-12-31 NOTE — Telephone Encounter (Signed)
Received a VM from Levada Dy the Freight forwarder at pts group home [phone]. Returned call, Levada Dy wanted to know the name of the lab test pt needed after being off of his PPI medication. Per the notes in pts chart, pt needs a H. Pylori test. Pt can restart PPI after testing is complete. Levada Dy is aware of this and will take pt to [facility] lab.

## 2020-01-01 ENCOUNTER — Other Ambulatory Visit: Payer: Self-pay

## 2020-01-01 DIAGNOSIS — R1010 Upper abdominal pain, unspecified: Secondary | ICD-10-CM | POA: Diagnosis not present

## 2020-01-03 LAB — H. PYLORI BREATH TEST: H pylori Breath Test: NEGATIVE

## 2020-01-05 NOTE — Progress Notes (Signed)
No evidence of H pylori

## 2020-01-12 DIAGNOSIS — Z23 Encounter for immunization: Secondary | ICD-10-CM | POA: Diagnosis not present

## 2020-02-04 ENCOUNTER — Other Ambulatory Visit: Payer: Self-pay | Admitting: Gastroenterology

## 2020-02-04 DIAGNOSIS — R1013 Epigastric pain: Secondary | ICD-10-CM

## 2020-02-04 DIAGNOSIS — K219 Gastro-esophageal reflux disease without esophagitis: Secondary | ICD-10-CM

## 2020-02-10 DIAGNOSIS — Z23 Encounter for immunization: Secondary | ICD-10-CM | POA: Diagnosis not present

## 2020-02-18 DIAGNOSIS — I10 Essential (primary) hypertension: Secondary | ICD-10-CM | POA: Diagnosis not present

## 2020-02-18 DIAGNOSIS — Z299 Encounter for prophylactic measures, unspecified: Secondary | ICD-10-CM | POA: Diagnosis not present

## 2020-02-18 DIAGNOSIS — F79 Unspecified intellectual disabilities: Secondary | ICD-10-CM | POA: Diagnosis not present

## 2020-02-18 DIAGNOSIS — R569 Unspecified convulsions: Secondary | ICD-10-CM | POA: Diagnosis not present

## 2020-02-24 DIAGNOSIS — F419 Anxiety disorder, unspecified: Secondary | ICD-10-CM | POA: Diagnosis not present

## 2020-03-25 ENCOUNTER — Encounter: Payer: Self-pay | Admitting: Internal Medicine

## 2020-04-15 ENCOUNTER — Other Ambulatory Visit: Payer: Self-pay

## 2020-04-15 ENCOUNTER — Inpatient Hospital Stay (HOSPITAL_COMMUNITY): Payer: Medicare Other | Attending: Hematology

## 2020-04-15 DIAGNOSIS — K635 Polyp of colon: Secondary | ICD-10-CM | POA: Diagnosis not present

## 2020-04-15 DIAGNOSIS — Z79899 Other long term (current) drug therapy: Secondary | ICD-10-CM | POA: Diagnosis not present

## 2020-04-15 DIAGNOSIS — D696 Thrombocytopenia, unspecified: Secondary | ICD-10-CM | POA: Insufficient documentation

## 2020-04-15 DIAGNOSIS — D509 Iron deficiency anemia, unspecified: Secondary | ICD-10-CM | POA: Diagnosis not present

## 2020-04-15 LAB — CBC WITH DIFFERENTIAL/PLATELET
Abs Immature Granulocytes: 0.02 10*3/uL (ref 0.00–0.07)
Basophils Absolute: 0 10*3/uL (ref 0.0–0.1)
Basophils Relative: 0 %
Eosinophils Absolute: 0.1 10*3/uL (ref 0.0–0.5)
Eosinophils Relative: 2 %
HCT: 42.8 % (ref 39.0–52.0)
Hemoglobin: 12.2 g/dL — ABNORMAL LOW (ref 13.0–17.0)
Immature Granulocytes: 0 %
Lymphocytes Relative: 48 %
Lymphs Abs: 2.9 10*3/uL (ref 0.7–4.0)
MCH: 20.1 pg — ABNORMAL LOW (ref 26.0–34.0)
MCHC: 28.5 g/dL — ABNORMAL LOW (ref 30.0–36.0)
MCV: 70.6 fL — ABNORMAL LOW (ref 80.0–100.0)
Monocytes Absolute: 0.4 10*3/uL (ref 0.1–1.0)
Monocytes Relative: 6 %
Neutro Abs: 2.7 10*3/uL (ref 1.7–7.7)
Neutrophils Relative %: 44 %
Platelets: 107 10*3/uL — ABNORMAL LOW (ref 150–400)
RBC: 6.06 MIL/uL — ABNORMAL HIGH (ref 4.22–5.81)
RDW: 19.8 % — ABNORMAL HIGH (ref 11.5–15.5)
WBC: 6 10*3/uL (ref 4.0–10.5)
nRBC: 0.5 % — ABNORMAL HIGH (ref 0.0–0.2)

## 2020-04-15 LAB — COMPREHENSIVE METABOLIC PANEL
ALT: 27 U/L (ref 0–44)
AST: 22 U/L (ref 15–41)
Albumin: 4.1 g/dL (ref 3.5–5.0)
Alkaline Phosphatase: 68 U/L (ref 38–126)
Anion gap: 8 (ref 5–15)
BUN: 12 mg/dL (ref 6–20)
CO2: 27 mmol/L (ref 22–32)
Calcium: 9.2 mg/dL (ref 8.9–10.3)
Chloride: 103 mmol/L (ref 98–111)
Creatinine, Ser: 0.65 mg/dL (ref 0.61–1.24)
GFR calc Af Amer: >60 mL/min (ref >60)
GFR calc non Af Amer: >60 mL/min (ref >60)
Glucose, Bld: 105 mg/dL — ABNORMAL HIGH (ref 70–99)
Potassium: 4.2 mmol/L (ref 3.5–5.1)
Sodium: 138 mmol/L (ref 135–145)
Total Bilirubin: 1.2 mg/dL (ref 0.3–1.2)
Total Protein: 7.5 g/dL (ref 6.5–8.1)

## 2020-04-15 LAB — VITAMIN D 25 HYDROXY (VIT D DEFICIENCY, FRACTURES): Vit D, 25-Hydroxy: 29.89 ng/mL — ABNORMAL LOW (ref 30–100)

## 2020-04-15 LAB — LACTATE DEHYDROGENASE: LDH: 152 U/L (ref 98–192)

## 2020-04-15 LAB — VITAMIN B12: Vitamin B-12: 345 pg/mL (ref 180–914)

## 2020-04-22 ENCOUNTER — Inpatient Hospital Stay (HOSPITAL_BASED_OUTPATIENT_CLINIC_OR_DEPARTMENT_OTHER): Payer: Medicare Other | Admitting: Nurse Practitioner

## 2020-04-22 ENCOUNTER — Other Ambulatory Visit: Payer: Self-pay

## 2020-04-22 DIAGNOSIS — D509 Iron deficiency anemia, unspecified: Secondary | ICD-10-CM | POA: Diagnosis not present

## 2020-04-22 DIAGNOSIS — K635 Polyp of colon: Secondary | ICD-10-CM | POA: Diagnosis not present

## 2020-04-22 DIAGNOSIS — D696 Thrombocytopenia, unspecified: Secondary | ICD-10-CM | POA: Diagnosis not present

## 2020-04-22 DIAGNOSIS — Z79899 Other long term (current) drug therapy: Secondary | ICD-10-CM | POA: Diagnosis not present

## 2020-04-22 NOTE — Progress Notes (Signed)
[place], [place] [id number]   CLINIC:  Medical Oncology/Hematology  PCP:  Monico Blitz, MD [address] [place] [address] [phone]   REASON FOR VISIT: Follow-up for microcytic anemia and thrombocytopenia   CURRENT THERAPY: Observation   INTERVAL HISTORY:  Cole Stewart 44 y.o. male returns for routine follow-up for microcytic anemia and thrombocytopenia.  Patient reports he is doing well since last visit.  He denies any bright red bleeding per rectum or melena.  He denies any easy bruising or bleeding.  He does report abdominal pain only after eating.  We have referred him to GI. Denies any nausea, vomiting, or diarrhea. Denies any new pains. Had not noticed any recent bleeding such as epistaxis, hematuria or hematochezia. Denies recent chest pain on exertion, shortness of breath on minimal exertion, pre-syncopal episodes, or palpitations. Denies any numbness or tingling in hands or feet. Denies any recent fevers, infections, or recent hospitalizations. Patient reports appetite at 100% and energy level at 75%.  He is eating well maintain his weight at this time.     REVIEW OF SYSTEMS:  Review of Systems  Gastrointestinal: Positive for abdominal pain (After eating).  All other systems reviewed and are negative.    PAST MEDICAL/SURGICAL HISTORY:  Past Medical History:  Diagnosis Date  . Anemia   . GERD (gastroesophageal reflux disease)   . Mental retardation   . Mood disorder ([place])   . MVP (mitral valve prolapse)   . Recurrent abdominal pain   . Seizures ([place])    last seizure was 2010.Unknown etiology  . Thrombocytopenia ([place])    Past Surgical History:  Procedure Laterality Date  . CHOLECYSTECTOMY    . COLONOSCOPY WITH PROPOFOL N/A 12/05/2018   PROPOFOL;  Surgeon: Daneil Dolin, MD;  One 5 mm polyp in the cecum.  Tubular adenoma.  Due for repeat in 2025.  Marland Kitchen ESOPHAGOGASTRODUODENOSCOPY (EGD) WITH PROPOFOL N/A 07/10/2019    PROPOFOL;  Surgeon: Daneil Dolin, MD; normal exam  . POLYPECTOMY  12/05/2018   Procedure: POLYPECTOMY;  Surgeon: Daneil Dolin, MD;  [facility];  Service: Endoscopy;;  colon     SOCIAL HISTORY:  Social History   Socioeconomic History  . Marital status: Single    Spouse name: Not on file  . Number of children: 0  . Years of education: Not on file  . Highest education level: Not on file  Occupational History  . Not on file  Tobacco Use  . Smoking status: Never Smoker  . Smokeless tobacco: Never Used  Vaping Use  . Vaping Use: Never used  Substance and Sexual Activity  . Alcohol use: No  . Drug use: No  . Sexual activity: Never  Other Topics Concern  . Not on file  Social History Narrative  . Not on file   Social Determinants of Health   Financial Resource Strain:   . Difficulty of Paying Living Expenses:   Food Insecurity:   . Worried About Charity fundraiser in the Last Year:   . Arboriculturist in the Last Year:   Transportation Needs:   . Film/video editor (Medical):   Marland Kitchen Lack of Transportation (Non-Medical):   Physical Activity:   . Days of Exercise per Week:   . Minutes of Exercise per Session:   Stress:   . Feeling of Stress :   Social Connections:   . Frequency of Communication with Friends and Family:   .  Frequency of Social Gatherings with Friends and Family:   . Attends Religious Services:   . Active Member of Clubs or Organizations:   . Attends Archivist Meetings:   Marland Kitchen Marital Status:   Intimate Partner Violence:   . Fear of Current or Ex-Partner:   . Emotionally Abused:   Marland Kitchen Physically Abused:   . Sexually Abused:     FAMILY HISTORY:  No family history on file.  CURRENT MEDICATIONS:  Outpatient Encounter Medications as of 04/22/2020  Medication Sig  . chlorhexidine (PERIDEX) 0.12 % solution Use as directed 15 mLs in the mouth or throat 2 (two) times daily.   . clonazePAM (KLONOPIN) 0.5 MG tablet Take 0.25-0.5  mg by mouth See admin instructions. Take 0.25 mg by mouth in the morning and take 0.5 mg at noon and at bedtime  . DEXILANT 60 MG capsule TAKE 1 CAPSULE BY MOUTH ONCE A DAY.  . divalproex (DEPAKOTE) 250 MG DR tablet Take 250-500 mg by mouth See admin instructions. Take 250 mg by mouth at 0700 and take 250 mg by mouth at 1200. Take 500 mg by mouth at bedtime 2000.  Marland Kitchen doxepin (SINEQUAN) 10 MG capsule Take 10 mg by mouth at bedtime.  . famotidine (PEPCID) 20 MG tablet TAKE 1 TABLET BY MOUTH AT BEDTIME.  Marland Kitchen gabapentin (NEURONTIN) 400 MG capsule Take 400-1,200 mg by mouth See admin instructions. Take 400 mg by mouth in the morning and take 1200 mg by mouth at bedtime  . gabapentin (NEURONTIN) 600 MG tablet   . Iloperidone (FANAPT) 6 MG TABS Take 6 mg by mouth daily.   . iron polysaccharides (NIFEREX) 150 MG capsule Take 150 mg by mouth daily.  Marland Kitchen ketoconazole (NIZORAL) 2 % shampoo Apply 1 application topically 2 (two) times a week. *Lather, leave for 5 minutes, then rinse*  . Olopatadine HCl (PATADAY) 0.2 % SOLN Place 1 drop into both eyes daily.   . Omega-3 Fatty Acids (FISH OIL OMEGA-3) 1000 MG CAPS Take 2,000 mg by mouth daily.  . QUEtiapine (SEROQUEL XR) 400 MG 24 hr tablet Take 400 mg by mouth at bedtime.  . Vitamin D, Ergocalciferol, (DRISDOL) 1.25 MG (50000 UT) CAPS capsule Take 50,000 Units by mouth every 30 (thirty) days.  . vitamin E 200 UNIT capsule Take 200 Units by mouth every morning.  . zolpidem (AMBIEN) 5 MG tablet Take 5 mg by mouth at bedtime.   . dicyclomine (BENTYL) 10 MG capsule Take 1 capsule by mouth as needed for abdominal pain or diarrhea up to 3 times daily.  Hold in the setting of constipation. (Patient not taking: Reported on 04/22/2020)  . polyethylene glycol powder (MIRALAX) 17 GM/SCOOP powder Take 17 g by mouth daily as needed for mild constipation. (Patient not taking: Reported on 04/22/2020)   No facility-administered encounter medications on file as of 04/22/2020.     ALLERGIES:  No Known Allergies   PHYSICAL EXAM:  ECOG Performance status: 1  Vitals:   04/22/20 1144  BP: 128/80  Pulse: 82  Resp: 18  Temp: (!) 97.3 F (36.3 C)  SpO2: 99%   Filed Weights   04/22/20 1144  Weight: 153 lb (69.4 kg)   Physical Exam Constitutional:      Appearance: Normal appearance. He is normal weight.  Cardiovascular:     Rate and Rhythm: Normal rate and regular rhythm.     Heart sounds: Normal heart sounds.  Pulmonary:     Effort: Pulmonary effort is normal.  Breath sounds: Normal breath sounds.  Abdominal:     General: Bowel sounds are normal.     Palpations: Abdomen is soft.  Musculoskeletal:        General: Normal range of motion.  Skin:    General: Skin is warm.  Neurological:     Mental Status: He is alert and oriented to person, place, and time. Mental status is at baseline.  Psychiatric:        Mood and Affect: Mood normal.        Behavior: Behavior normal.        Thought Content: Thought content normal.        Judgment: Judgment normal.      LABORATORY DATA:  I have reviewed the labs as listed.  CBC    Component Value Date/Time   WBC 6.0 04/15/2020 1110   RBC 6.06 (H) 04/15/2020 1110   HGB 12.2 (L) 04/15/2020 1110   HCT 42.8 04/15/2020 1110   PLT 107 (L) 04/15/2020 1110   MCV 70.6 (L) 04/15/2020 1110   MCH 20.1 (L) 04/15/2020 1110   MCHC 28.5 (L) 04/15/2020 1110   RDW 19.8 (H) 04/15/2020 1110   LYMPHSABS 2.9 04/15/2020 1110   MONOABS 0.4 04/15/2020 1110   EOSABS 0.1 04/15/2020 1110   BASOSABS 0.0 04/15/2020 1110   CMP Latest Ref Rng & Units 04/15/2020 07/18/2019 10/23/2018  Glucose 70 - 99 mg/dL 105(H) 90 92  BUN 6 - 20 mg/dL _0 Creatinine 0.61 - 1.24 mg/dL 0.65 0.59(L) 0.70  Sodium 135 - 145 mmol/L 138 139 139  Potassium 3.5 - 5.1 mmol/L 4.2 3.9 4.2  Chloride 98 - 111 mmol/L 103 105 103  CO2 22 - 32 mmol/L _1 Calcium 8.9 - 10.3 mg/dL 9.2 8.8(L) 9.3  Total Protein 6.5 - 8.1 g/dL 7.5 6.8 7.0   Total Bilirubin 0.3 - 1.2 mg/dL 1.2 0.8 0.9  Alkaline Phos 38 - 126 U/L 68 66 -  AST 15 - 41 U/L _2 ALT 0 - 44 U/L _3 All questions were answered to patient's stated satisfaction. Encouraged patient to call with any new concerns or questions before his next visit to the cancer center and we can certain see him sooner, if needed.     ASSESSMENT & PLAN:  Microcytic anemia 1.  Microcytic anemia: -He had microcytic anemia since 2009, with hemoglobin between 11 and 12. -MCV has been between 66 and 70.  He is on Niferex 150 mg daily. -Colonoscopy on 12/05/2018 showed 1.5 mm polyp in the cecum.  Otherwise normal exam.  Pathology showed tubular adenoma. -EGD on 07/10/2019 showed normal esophagus, stomach, normal duodenal bulb and second part of duodenum. -No clear history of bleeding per rectum or melena. -Work-up including ferritin, iron panel, U44, folic acid, methylmalonic acid and copper levels were all normal.  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 -Labs done on 04/15/2020 showed hemoglobin 12.2 stable. -No intervention needed at this time. -He will follow-up in 6 months with repeat labs.  2.  Thrombocytopenia: -He has mild thrombocytopenia since 2009. -No bleeding history including nosebleeds, hematuria or hematochezia reported.  No easy bruising. -CT scan of abdomen 10/2018 showed hepatic steatosis.  Craniocaudal spleen size is measured at 13 cm, slightly more than upper limit of normal. -Differential diagnosis for mild thrombocytopenia include immune mediated thrombocytopenia versus splenomegaly. -Nutritional work-up and infectious etiology was negative.  H. pylori IgA level was elevated with normal IgG, nonspecific. -If  there is significant worsening of platelets we will consider bone marrow biopsy. -Labs done on 04/15/2020 showed platelets 107 -He will follow-up in 6 months with repeat labs.  3.  Abdominal pain after eating: -We sent him a referral to  GI.     Orders placed this encounter:  Orders Placed This Encounter  Procedures  . Lactate dehydrogenase  . CBC with Differential/Platelet  . Comprehensive metabolic panel  . Ferritin  . Iron and TIBC  . Vitamin B12  . VITAMIN D 25 Hydroxy (Vit-D Deficiency, Fractures)  . Haptoglobin      Francene Finders, FNP-C [place] [phone]

## 2020-04-22 NOTE — Assessment & Plan Note (Signed)
1.  Microcytic anemia: -He had microcytic anemia since 2009, with hemoglobin between 11 and 12. -MCV has been between 66 and 70.  He is on Niferex 150 mg daily. -Colonoscopy on 12/05/2018 showed 1.5 mm polyp in the cecum.  Otherwise normal exam.  Pathology showed tubular adenoma. -EGD on 07/10/2019 showed normal esophagus, stomach, normal duodenal bulb and second part of duodenum. -No clear history of bleeding per rectum or melena. -Work-up including ferritin, iron panel, O03, folic acid, methylmalonic acid and copper levels were all normal.  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 -Labs done on 04/15/2020 showed hemoglobin 12.2 stable. -No intervention needed at this time. -He will follow-up in 6 months with repeat labs.  2.  Thrombocytopenia: -He has mild thrombocytopenia since 2009. -No bleeding history including nosebleeds, hematuria or hematochezia reported.  No easy bruising. -CT scan of abdomen 10/2018 showed hepatic steatosis.  Craniocaudal spleen size is measured at 13 cm, slightly more than upper limit of normal. -Differential diagnosis for mild thrombocytopenia include immune mediated thrombocytopenia versus splenomegaly. -Nutritional work-up and infectious etiology was negative.  H. pylori IgA level was elevated with normal IgG, nonspecific. -If there is significant worsening of platelets we will consider bone marrow biopsy. -Labs done on 04/15/2020 showed platelets 107 -He will follow-up in 6 months with repeat labs.  3.  Abdominal pain after eating: -We sent him a referral to GI.

## 2020-04-22 NOTE — Patient Instructions (Signed)
[facility] at [facility] Discharge Instructions  Follow up in 6 months with labs    Thank you for choosing  Cancer Center at [facility] to provide your oncology and hematology care.  To afford each patient quality time with our provider, please arrive at least 15 minutes before your scheduled appointment time.   If you have a lab appointment with the Cancer Center please come in thru the Main Entrance and check in at the main information desk.  You need to re-schedule your appointment should you arrive 10 or more minutes late.  We strive to give you quality time with our providers, and arriving late affects you and other patients whose appointments are after yours.  Also, if you no show three or more times for appointments you may be dismissed from the clinic at the providers discretion.     Again, thank you for choosing [facility].  Our hope is that these requests will decrease the amount of time that you wait before being seen by our physicians.       _____________________________________________________________  Should you have questions after your visit to [facility], please contact our office at [phone] between the hours of 8:00 a.m. and 4:30 p.m.  Voicemails left after 4:00 p.m. will not be returned until the following business day.  For prescription refill requests, have your pharmacy contact our office and allow 72 hours.    Due to Covid, you will need to wear a mask upon entering the hospital. If you do not have a mask, a mask will be given to you at the Main Entrance upon arrival. For doctor visits, patients may have 1 support person with them. For treatment visits, patients can not have anyone with them due to social distancing guidelines and our immunocompromised population.      

## 2020-05-05 DIAGNOSIS — I10 Essential (primary) hypertension: Secondary | ICD-10-CM | POA: Diagnosis not present

## 2020-05-05 DIAGNOSIS — K219 Gastro-esophageal reflux disease without esophagitis: Secondary | ICD-10-CM | POA: Diagnosis not present

## 2020-05-19 DIAGNOSIS — D696 Thrombocytopenia, unspecified: Secondary | ICD-10-CM | POA: Diagnosis not present

## 2020-05-19 DIAGNOSIS — I10 Essential (primary) hypertension: Secondary | ICD-10-CM | POA: Diagnosis not present

## 2020-05-19 DIAGNOSIS — F6381 Intermittent explosive disorder: Secondary | ICD-10-CM | POA: Diagnosis not present

## 2020-05-19 DIAGNOSIS — Z299 Encounter for prophylactic measures, unspecified: Secondary | ICD-10-CM | POA: Diagnosis not present

## 2020-05-19 DIAGNOSIS — R569 Unspecified convulsions: Secondary | ICD-10-CM | POA: Diagnosis not present

## 2020-05-26 DIAGNOSIS — F419 Anxiety disorder, unspecified: Secondary | ICD-10-CM | POA: Diagnosis not present

## 2020-05-31 DIAGNOSIS — D696 Thrombocytopenia, unspecified: Secondary | ICD-10-CM | POA: Diagnosis not present

## 2020-05-31 DIAGNOSIS — R569 Unspecified convulsions: Secondary | ICD-10-CM | POA: Diagnosis not present

## 2020-05-31 DIAGNOSIS — I10 Essential (primary) hypertension: Secondary | ICD-10-CM | POA: Diagnosis not present

## 2020-05-31 DIAGNOSIS — Z299 Encounter for prophylactic measures, unspecified: Secondary | ICD-10-CM | POA: Diagnosis not present
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
## 2020-06-04 ENCOUNTER — Encounter: Payer: Self-pay | Admitting: Gastroenterology

## 2020-06-04 ENCOUNTER — Other Ambulatory Visit: Payer: Self-pay

## 2020-06-04 ENCOUNTER — Ambulatory Visit (INDEPENDENT_AMBULATORY_CARE_PROVIDER_SITE_OTHER): Payer: Medicare Other | Admitting: Gastroenterology

## 2020-06-04 VITALS — BP 125/80 | HR 85 | Temp 97.3°F | Ht 62.0 in | Wt 154.4 lb

## 2020-06-04 DIAGNOSIS — R109 Unspecified abdominal pain: Secondary | ICD-10-CM

## 2020-06-04 DIAGNOSIS — I10 Essential (primary) hypertension: Secondary | ICD-10-CM | POA: Diagnosis not present

## 2020-06-04 DIAGNOSIS — R198 Other specified symptoms and signs involving the digestive system and abdomen: Secondary | ICD-10-CM

## 2020-06-04 DIAGNOSIS — K625 Hemorrhage of anus and rectum: Secondary | ICD-10-CM | POA: Diagnosis not present

## 2020-06-04 DIAGNOSIS — K219 Gastro-esophageal reflux disease without esophagitis: Secondary | ICD-10-CM

## 2020-06-04 NOTE — Assessment & Plan Note (Addendum)
44 year old male who is mentally challenged and minimally communicative with chronic history of mid upper abdominal pain that has been present at least since 2019 without identified etiology.  Extensive work-up in the past with CT A/P in 2019 unrevealing.  Celiac testing in 2019 only with mildly elevated gliadin IgA.  TCS in January 2020 with 1 tubular adenoma, due for repeat in 2025.  EGD in September 2020 completely normal.  H. pylori breath test negative.  GERD symptoms well controlled on Dexilant and Pepcid.  At his last visit in February 2021, patient was having alternating constipation and diarrhea.  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  He was advised to start MiraLAX daily and use Bentyl as needed for diarrhea/abdominal pain.  It is unclear whether patient is taking MiraLAX or Bentyl as he was not sent in the medication list today.  Caregiver who presents with patient reports patient is no longer complaining of any abdominal pain and his bowel regularity has improved with 2 BMs daily.  He is eating well and is actually gained 4 lbs since his last visit.  When asking patient directly, he does state "yes" when asking if he has abdominal pain and points to and points to his mid abdomen just above his belly button.  Symptoms do not occur daily, are very mild, typically in the evening after dinner, and does not limit activity or eating.  Unclear whether pain is before BMs.  Again abdominal exam is completely benign.  Overall, seems abdominal pain is improving.  IBS/underlying constipation is still my primary suspicion as symptoms have improved with bowel regularity.  May have functional abdominal pain.  Interestingly patient reports he is having bright red blood per rectum, but I am not sure if this is something that is happening currently or something that has happened in the past.  Doubt IBD with recent colonoscopy 1.5 years ago.  Again I am not sure if there is some misinterpretation of what patient is trying to  explain as communication is very limited.  As symptoms are improving, we will likely need to continue to monitor for now without any additional work-up for abdominal pain specifically. Will update CBC and obtain IFOBT for report of rectal bleeding. I have requested patient group home to fax over his current med list to determine whether or not he is taking MiraLAX or Bentyl to see if maybe we can tailor this a little more.  Further recommendations to follow.

## 2020-06-04 NOTE — Assessment & Plan Note (Addendum)
Alternating constipation and diarrhea seems to have resolved.  Caregiver reports patient having a bowel movement about twice a day.  Unclear whether or not patient is receiving MiraLAX or Bentyl which had previously been recommended.  MiraLAX was to be given daily for constipation and hold on the days of diarrhea.  Bentyl was for diarrhea/abdominal pain. Caregiver who presents with patient is unaware of his medications.  I have requested that his group home fax over his current medication list.  Further recommendations to follow.  What ever regimen he is on currently seems to be working well.

## 2020-06-04 NOTE — Assessment & Plan Note (Addendum)
44 year old male who is mentally challenged and minimally communicative who states "yes" when asking if he has bright red blood in his stool.  Denies symptoms occurring daily.  Denies black stool.  Caregiver who presents with patient today is unaware of any rectal bleeding.  No constipation or diarrhea at this time.  History of chronic intermittent mid to upper abdominal pain without clear etiology that actually seems to be doing somewhat better as discussed below.  Colonoscopy in January 2020 with 1 tubular adenoma with recommendations to repeat in 5 years.  Most recent hemoglobin 12.2 in June 2021 which is stable.  Patient is following with hematology due to anemia and was found to be thalassemia carrier.  Prior EGD in September 2020 for anemia was completely normal.  As it is very difficult to get a clear history from patient, I am not sure if patient is currently having rectal bleeding or if this is something that occurred in the past.  I have asked staff to keep an eye on patient stool and let us know of any rectal bleeding.  We will also update CBC to ensure hemoglobin has remained stable and complete I fobt.

## 2020-06-04 NOTE — Patient Instructions (Addendum)
Please have labs and stool test completed  Please send [place] an updated list of the medications he is taking including dosage and frequency of medications.   I will review the medication list and call with further recommendations.   Please monitor stools for bright red blood or black stool and let me know if this occurs. Please call if abdominal pain becomes more frequent or if you have decreased bowel frequency or other concerns.    Aliene Altes, PA-C [facility]

## 2020-06-04 NOTE — Assessment & Plan Note (Signed)
Typical GERD symptoms are well controlled.  Patient does not have med list today, some not able to verify medications.  He has typically been on Dexilant 60 mg daily and Pepcid before bed.  I am requesting a med list to be sent from his group home.  We will have further recommendations to follow.  Will likely plan to continue his current regimen.

## 2020-06-11 ENCOUNTER — Telehealth: Payer: Self-pay | Admitting: Gastroenterology

## 2020-06-11 NOTE — Telephone Encounter (Signed)
Cole Stewart, patient did not have medication list at his visit last week. I have received and reviewed his medication list from his group home.   Does not appear he is taking Bentyl or MiraLAX. He has continues with Dexilant daily and Pepcid at bedtime.   Interestingly, per the care provider that presented with patient at his office visit, patient was no longer complaining of abdomina pain and bowel movement regularity had improved with 2 BMs per day.   In regards to abdominal pain: 1. I would like to continue to monitor this rather than adding any additional medications or pursuing any further imaging studies as it seems his symptoms are improving.  2. Can you check with the group home to see if they have documentation as to how often patient is having a BM? If so, I would like to have that information.  3. If they do not have record of BMs, it would be very helpful if they could keep a record of his bowel movements as well as abdominal pain if it occurs including the timing and any possible associated factors including meals, activity, or relation to bowel movements. They should send the information with patient along with a medication list at his next visit.   Additionally, patient had reported brbpr at his OV. It was difficult to determine if this was recent or in the past. I had requested CBC, IFOBT, and for staff member to keep an eye on his stool for bright red blood or black stools. Can we follow-up on this?

## 2020-06-11 NOTE — Telephone Encounter (Signed)
Lmom, waiting on a return call.  

## 2020-07-05 ENCOUNTER — Other Ambulatory Visit: Payer: Self-pay | Admitting: Gastroenterology

## 2020-07-05 DIAGNOSIS — Z79899 Other long term (current) drug therapy: Secondary | ICD-10-CM | POA: Diagnosis not present

## 2020-07-05 DIAGNOSIS — Z0000 Encounter for general adult medical examination without abnormal findings: Secondary | ICD-10-CM | POA: Diagnosis not present

## 2020-07-05 DIAGNOSIS — R5383 Other fatigue: Secondary | ICD-10-CM | POA: Diagnosis not present

## 2020-07-05 DIAGNOSIS — Z299 Encounter for prophylactic measures, unspecified: Secondary | ICD-10-CM | POA: Diagnosis not present

## 2020-07-05 DIAGNOSIS — F79 Unspecified intellectual disabilities: Secondary | ICD-10-CM | POA: Diagnosis not present

## 2020-07-05 DIAGNOSIS — D696 Thrombocytopenia, unspecified: Secondary | ICD-10-CM | POA: Diagnosis not present

## 2020-07-05 DIAGNOSIS — Z1339 Encounter for screening examination for other mental health and behavioral disorders: Secondary | ICD-10-CM | POA: Diagnosis not present

## 2020-07-05 DIAGNOSIS — Z6825 Body mass index (BMI) 25.0-25.9, adult: Secondary | ICD-10-CM | POA: Diagnosis not present

## 2020-07-05 DIAGNOSIS — Z1331 Encounter for screening for depression: Secondary | ICD-10-CM | POA: Diagnosis not present

## 2020-07-05 DIAGNOSIS — I10 Essential (primary) hypertension: Secondary | ICD-10-CM | POA: Diagnosis not present

## 2020-07-09 IMAGING — CT CT ABD-PELV W/ CM
2 of 5 series · 15 of 46 positions shown, 17 images · IV contrast (Isovue)
Comparison: August 14, 2014

CLINICAL DATA: Abdominal pain and weight loss

EXAM:
CT ABDOMEN AND PELVIS WITH CONTRAST
TECHNIQUE: Multidetector CT imaging of the abdomen and pelvis was performed
using the standard protocol following bolus administration of
intravenous contrast.
CONTRAST:  100mL [id number] IOPAMIDOL ([id number]) INJECTION 61%

[Series 2: axial st · axial · 0.78mm/px · z∈[+1061,+1506]mm · 12 of 101 slices shown, 14 images]
[im 6/101  soft-tissue]
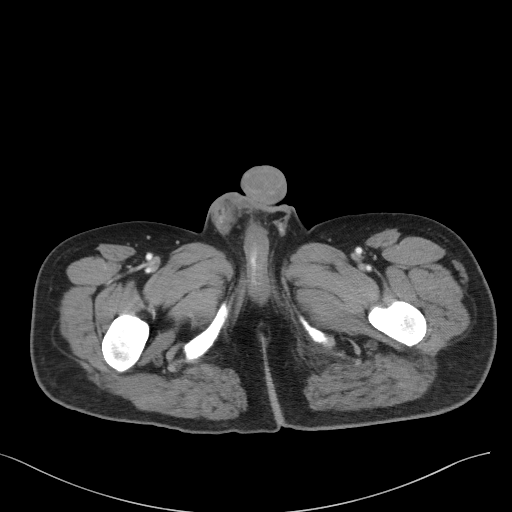
[im 6/101  bone]
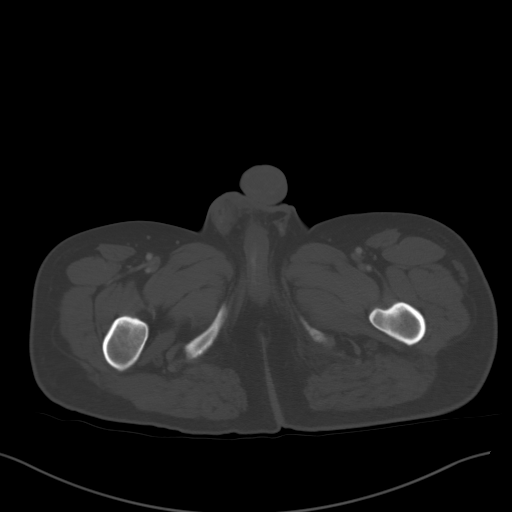
[im 17/101  soft-tissue]
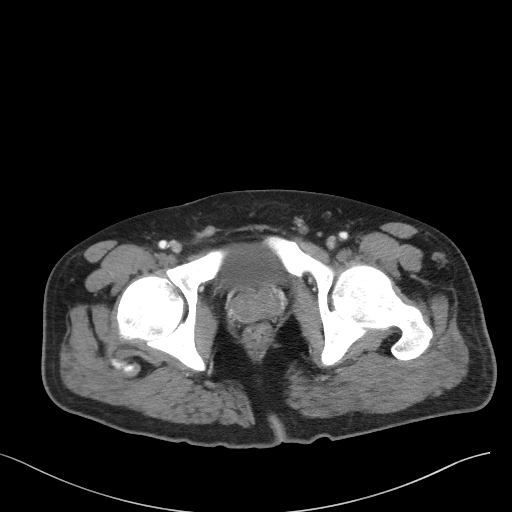
[im 23/101  soft-tissue]
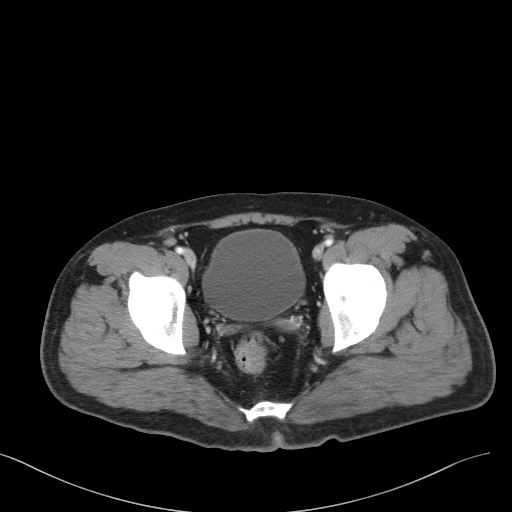
[im 28/101  soft-tissue]
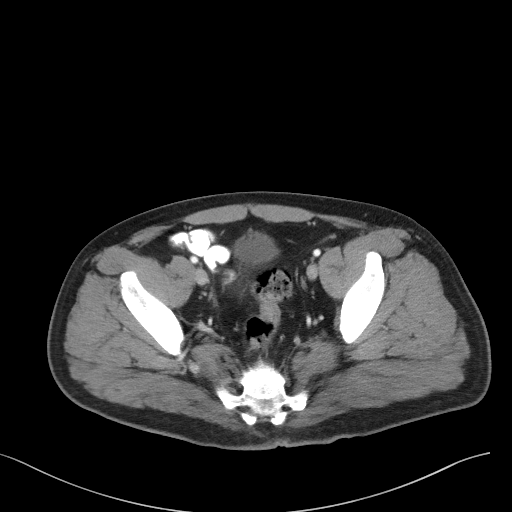
[im 39/101  soft-tissue]
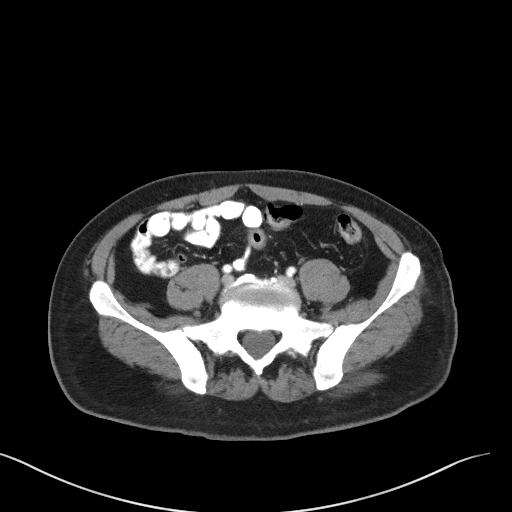
[im 45/101  soft-tissue]
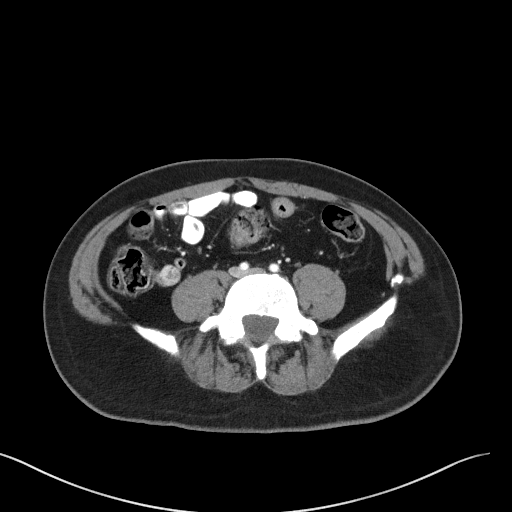
[im 56/101  soft-tissue]
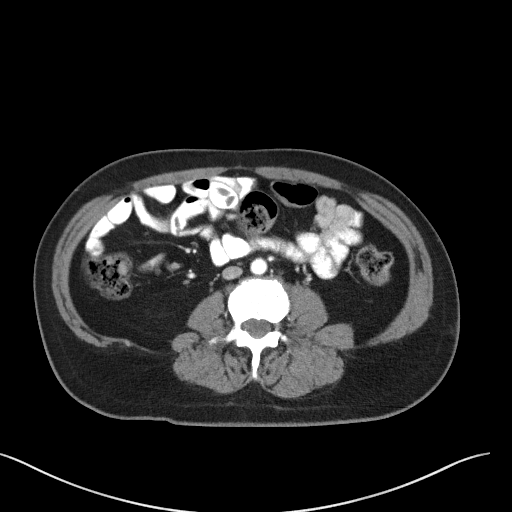
[im 62/101  soft-tissue]
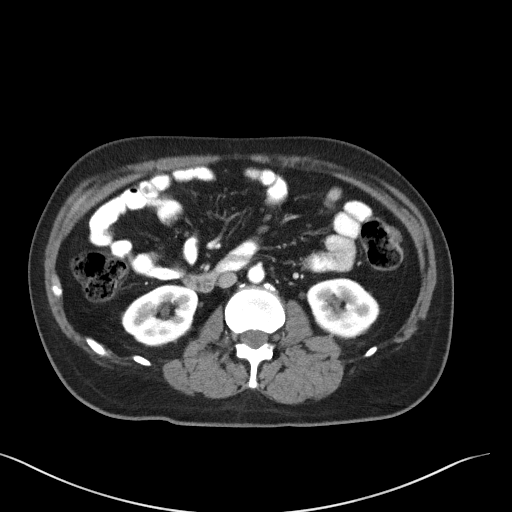
[im 73/101  soft-tissue]
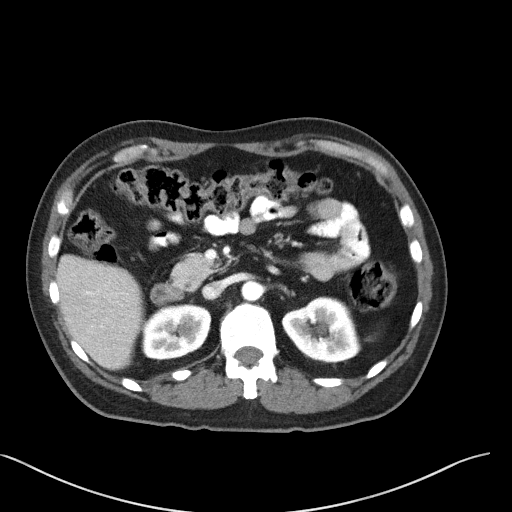
[im 73/101  bone]
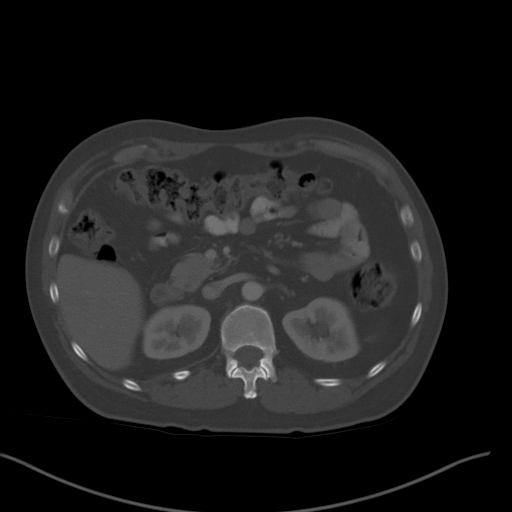
[im 78/101  soft-tissue]
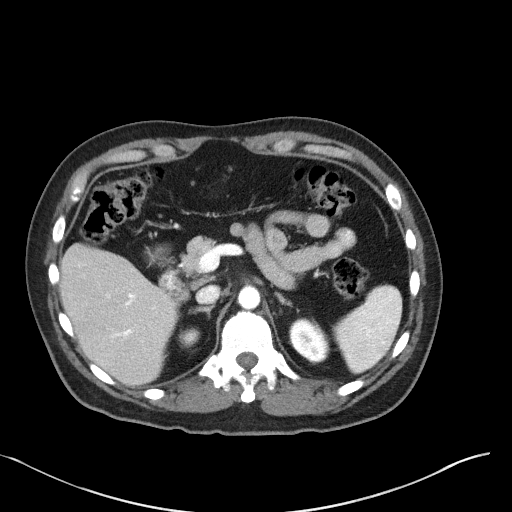
[im 84/101  soft-tissue]
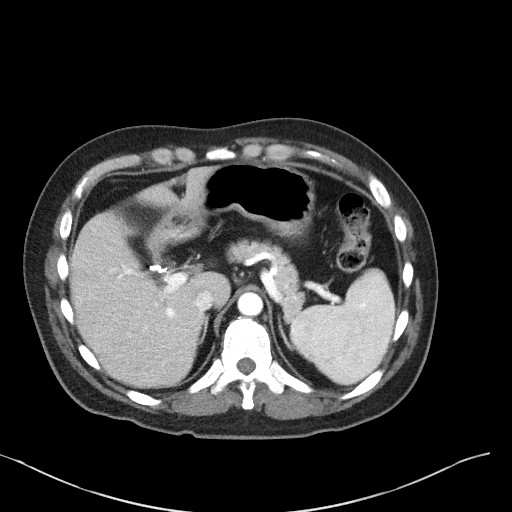
[im 95/101  soft-tissue]
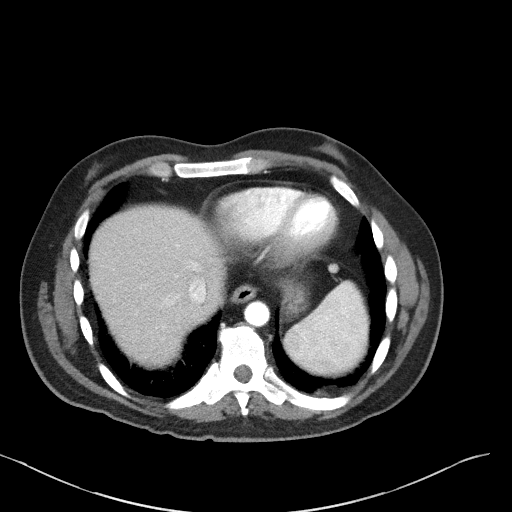

[Series 6: coronal st · coronal · 0.66mm/px · 3 of 87 slices shown]
[im 29/87  soft-tissue]
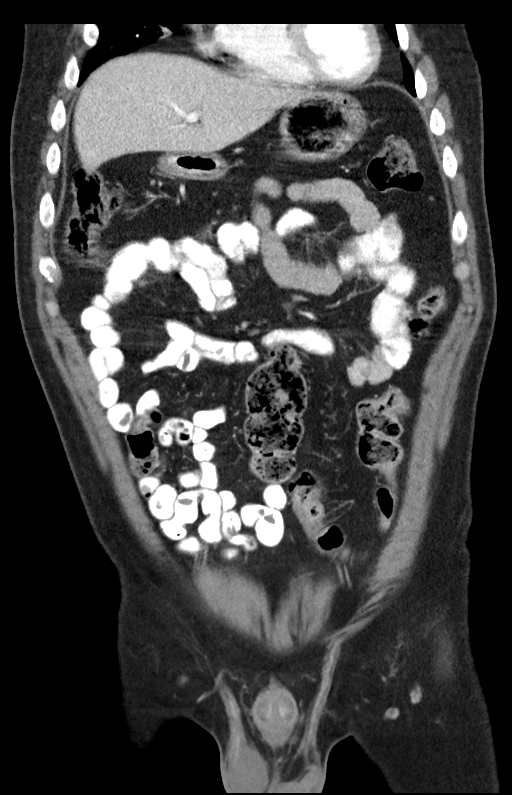
[im 39/87  soft-tissue]
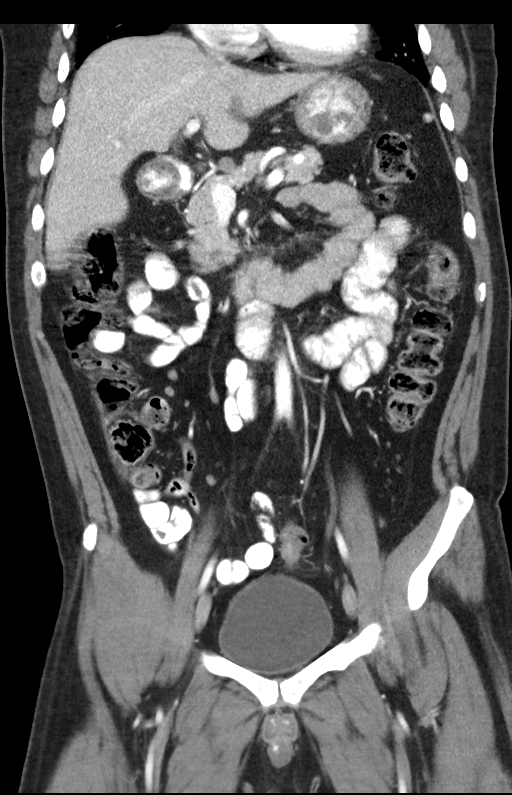
[im 48/87  soft-tissue]
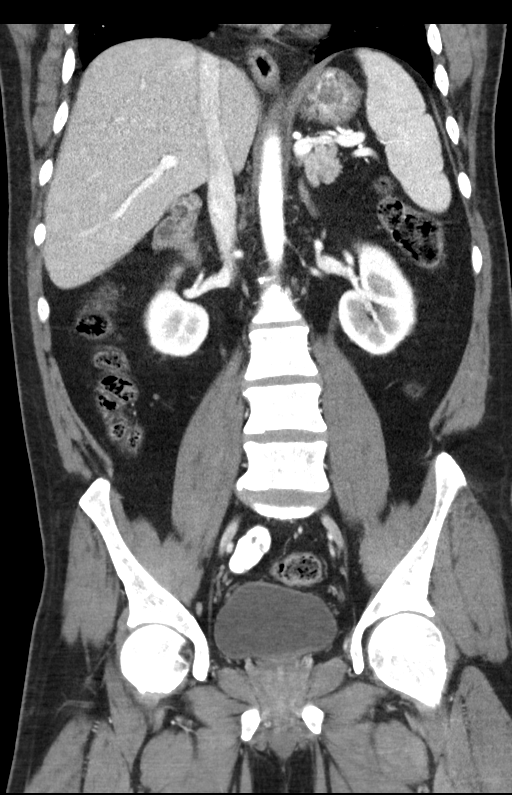

[15 of 46 positions shown; findings below may reference images not displayed]

FINDINGS: Lower chest: There is focal scarring in the medial right base. There
is interstitial prominence in the lung base regions, stable.

Hepatobiliary: There is a degree of hepatic steatosis. No focal
liver lesions are appreciable. Gallbladder is absent. There is no
appreciable biliary duct dilatation.

Pancreas: There is no appreciable pancreatic mass or inflammatory
focus.

Spleen: No splenic lesions are evident.

Adrenals/Urinary Tract: Adrenals bilaterally appear unremarkable.
There is a 9 x 9 mm cyst in the mid right kidney. There is no
hydronephrosis on either side. There is no evident renal or ureteral
calculus on either side. Urinary bladder is midline with wall
thickness within normal limits.

Stomach/Bowel: There is moderate stool throughout the colon. There
is no appreciable bowel wall or mesenteric thickening. There is no
evident bowel obstruction. There is no free air or portal venous
air.

Vascular/Lymphatic: There is no abdominal aortic aneurysm. No
vascular lesions are appreciable. There is no adenopathy in the
abdomen or pelvis.

Reproductive: Prostate and seminal vesicles appear normal in size
and contour. There is no evident pelvic mass.

Other: The appendix appears normal. There is no abscess or ascites
in the abdomen or pelvis.

Musculoskeletal: There are no blastic or lytic bone lesions. There
is no intramuscular or abdominal wall lesion.
IMPRESSION: 1. No appreciable bowel wall thickening or bowel obstruction. No
evident abscess in the abdomen or pelvis. Appendix appears normal.

2.  No renal or ureteral calculus.  No hydronephrosis.

3.  Gallbladder absent.

4.  Hepatic steatosis.

5. Chronic interstitial thickening in the lung bases of uncertain
etiology.

## 2020-07-13 NOTE — Telephone Encounter (Signed)
Cole Stewart, can we follow-up on this?

## 2020-07-13 NOTE — Telephone Encounter (Signed)
Spoke with Ivonne Andrew. She states pt has a BM twice daily and doesn't have a problem have a BM. She didn't receive the lab orders or IFOBT kit. She is going to stop by tomorrow and pick the IFOBT kit up and lab orders. Pt will have labs drawn tomorrow. Glenard Haring is aware that she needs to keep a BM log for pt. She will monitor his stool and let us know if any changes.

## 2020-07-14 ENCOUNTER — Encounter: Payer: Self-pay | Admitting: Internal Medicine

## 2020-07-14 DIAGNOSIS — K625 Hemorrhage of anus and rectum: Secondary | ICD-10-CM | POA: Diagnosis not present

## 2020-07-14 LAB — CBC WITH DIFFERENTIAL/PLATELET
Absolute Monocytes: 413 cells/uL (ref 200–950)
Basophils Absolute: 8 cells/uL (ref 0–200)
Basophils Relative: 0.1 %
Eosinophils Absolute: 122 cells/uL (ref 15–500)
Eosinophils Relative: 1.5 %
HCT: 38.1 % — ABNORMAL LOW (ref 38.5–50.0)
Hemoglobin: 11.1 g/dL — ABNORMAL LOW (ref 13.2–17.1)
Lymphs Abs: 2778 cells/uL (ref 850–3900)
MCH: 20.8 pg — ABNORMAL LOW (ref 27.0–33.0)
MCHC: 29.1 g/dL — ABNORMAL LOW (ref 32.0–36.0)
MCV: 71.3 fL — ABNORMAL LOW (ref 80.0–100.0)
Monocytes Relative: 5.1 %
Neutro Abs: 4779 cells/uL (ref 1500–7800)
Neutrophils Relative %: 59 %
Platelets: 101 10*3/uL — ABNORMAL LOW (ref 140–400)
RBC: 5.34 10*6/uL (ref 4.20–5.80)
RDW: 19.1 % — ABNORMAL HIGH (ref 11.0–15.0)
Total Lymphocyte: 34.3 %
WBC: 8.1 10*3/uL (ref 3.8–10.8)

## 2020-07-14 NOTE — Telephone Encounter (Signed)
Noted  

## 2020-07-18 NOTE — Progress Notes (Signed)
Hemoglobin low at 11.1. This is down from 12.2 3 months ago, but anemia is chronic and this is within his baseline fluctuations in the past. Suspect this is likely related to thalassemia carrier state for which he is followed by hematology. We will wait for IFOBT result.

## 2020-07-21 NOTE — Telephone Encounter (Signed)
Cole Stewart, can we follow-up on completing and returning IFOBT?

## 2020-07-21 NOTE — Telephone Encounter (Signed)
Lmom, waiting on a return call.  

## 2020-07-22 NOTE — Telephone Encounter (Signed)
Noted  

## 2020-07-22 NOTE — Telephone Encounter (Signed)
Spoke with Cole Stewart. The IFOBT kit was picked up from our office and the home will return it soon per Ms. Wilson.

## 2020-08-03 DIAGNOSIS — H43393 Other vitreous opacities, bilateral: Secondary | ICD-10-CM | POA: Diagnosis not present

## 2020-08-05 DIAGNOSIS — K219 Gastro-esophageal reflux disease without esophagitis: Secondary | ICD-10-CM | POA: Diagnosis not present

## 2020-08-05 DIAGNOSIS — I10 Essential (primary) hypertension: Secondary | ICD-10-CM | POA: Diagnosis not present

## 2020-08-06 ENCOUNTER — Other Ambulatory Visit: Payer: Self-pay | Admitting: Gastroenterology

## 2020-08-06 DIAGNOSIS — R1013 Epigastric pain: Secondary | ICD-10-CM

## 2020-08-06 DIAGNOSIS — K219 Gastro-esophageal reflux disease without esophagitis: Secondary | ICD-10-CM

## 2020-08-25 DIAGNOSIS — F419 Anxiety disorder, unspecified: Secondary | ICD-10-CM | POA: Diagnosis not present

## 2020-08-27 ENCOUNTER — Encounter: Payer: Self-pay | Admitting: Internal Medicine

## 2020-08-27 ENCOUNTER — Other Ambulatory Visit: Payer: Self-pay

## 2020-08-27 ENCOUNTER — Ambulatory Visit (INDEPENDENT_AMBULATORY_CARE_PROVIDER_SITE_OTHER): Payer: Medicare Other | Admitting: Internal Medicine

## 2020-08-27 VITALS — BP 133/82 | HR 98 | Temp 97.1°F | Ht 62.0 in | Wt 136.6 lb

## 2020-08-27 DIAGNOSIS — K219 Gastro-esophageal reflux disease without esophagitis: Secondary | ICD-10-CM

## 2020-08-27 DIAGNOSIS — Z862 Personal history of diseases of the blood and blood-forming organs and certain disorders involving the immune mechanism: Secondary | ICD-10-CM | POA: Diagnosis not present

## 2020-08-27 DIAGNOSIS — R109 Unspecified abdominal pain: Secondary | ICD-10-CM

## 2020-08-27 NOTE — Patient Instructions (Signed)
No further GI evaluation warranted at this time.  Continue Dexilant 60 mg daily with Pepcid for any breakthrough reflux symptoms  Use MiraLAX 17 g orally daily as needed for constipation  Office visit with [place] in 1 year  Plan for surveillance colonoscopy 2025-history of colonic polyps.

## 2020-08-27 NOTE — Progress Notes (Signed)
Primary Care Physician:  Monico Blitz, MD Primary Gastroenterologist:  Dr. Gala Romney  Pre-Procedure History & Physical: HPI:  Cole Stewart is a 45 y.o. male here for follow-up of nonspecific abdominal pain.  History of microcytic anemia.  Known thalassemia trait.  H. pylori breath test completely negative EGD previously negative.  Colonoscopy previously demonstrated adenoma-due for surveillance 2025.  Patient accompanied by caregiver today.  States he is not having any abdominal pain.  We asked him to return a stool sample for fecal occult blood testing.  They brought [place] when it was collected 2 weeks ago.  It was discarded.  CT demonstrated hepatic steatosis and renal cyst (2019)  It is notable patient has gained 14 pounds since his July office visit here (154-167.6)  Past Medical History:  Diagnosis Date  . Anemia   . GERD (gastroesophageal reflux disease)   . Mental retardation   . Mood disorder ([place])   . MVP (mitral valve prolapse)   . Recurrent abdominal pain   . Seizures ([place])    last seizure was 2010.Unknown etiology  . Thrombocytopenia ([place])     Past Surgical History:  Procedure Laterality Date  . CHOLECYSTECTOMY    . COLONOSCOPY WITH PROPOFOL N/A 12/05/2018   PROPOFOL;  Surgeon: Daneil Dolin, MD;  One 5 mm polyp in the cecum.  Tubular adenoma.  Due for repeat in 2025.  Marland Kitchen ESOPHAGOGASTRODUODENOSCOPY (EGD) WITH PROPOFOL N/A 07/10/2019   PROPOFOL;  Surgeon: Daneil Dolin, MD; normal exam  . POLYPECTOMY  12/05/2018   Procedure: POLYPECTOMY;  Surgeon: Daneil Dolin, MD;  [facility]: [facility];  Service: Endoscopy;;  colon    Prior to Admission medications   Medication Sig Start Date End Date Taking? Authorizing Provider  chlorhexidine (PERIDEX) 0.12 % solution Use as directed 15 mLs in the mouth or throat 2 (two) times daily.     [provider]  clonazePAM (KLONOPIN) 0.5 MG tablet Take 0.25-0.5 mg by mouth See admin instructions. Take 0.25 mg by mouth in  the morning and take 0.5 mg at noon and at bedtime    [provider]  DEXILANT 60 MG capsule TAKE 1 CAPSULE BY MOUTH ONCE A DAY. 08/13/20   Carlis Stable, NP  dicyclomine (BENTYL) 10 MG capsule Take 1 capsule by mouth as needed for abdominal pain or diarrhea up to 3 times daily.  Hold in the setting of constipation. Patient not taking: Reported on 04/22/2020 12/10/19   Erenest Rasher, PA-C  divalproex (DEPAKOTE) 250 MG DR tablet Take 250-500 mg by mouth See admin instructions. Take 250 mg by mouth at 0700 and take 250 mg by mouth at 1200. Take 500 mg by mouth at bedtime 2000.    [provider]  doxepin (SINEQUAN) 10 MG capsule Take 10 mg by mouth at bedtime.    [provider]  famotidine (PEPCID) 20 MG tablet TAKE 1 TABLET BY MOUTH AT BEDTIME. 07/07/20   Annitta Needs, NP  gabapentin (NEURONTIN) 400 MG capsule Take 400-1,200 mg by mouth See admin instructions. Take 400 mg by mouth in the morning and take 1200 mg by mouth at bedtime    [provider]  gabapentin (NEURONTIN) 600 MG tablet  03/30/20   [provider]  Iloperidone (FANAPT) 6 MG TABS Take 6 mg by mouth daily.     [provider]  iron polysaccharides (NIFEREX) 150 MG capsule Take 150 mg by mouth daily.    [provider]  ketoconazole (NIZORAL) 2 % shampoo Apply 1 application topically 2 (two) times a week. *Lather, leave for 5 minutes, then rinse*    [provider]  Olopatadine HCl (PATADAY) 0.2 % SOLN Place 1 drop into both eyes daily.     [provider]  Omega-3 Fatty Acids (FISH OIL OMEGA-3) 1000 MG CAPS Take 2,000 mg by mouth daily.    [provider]  polyethylene glycol powder (MIRALAX) 17 GM/SCOOP powder Take 17 g by mouth daily as needed for mild constipation. Patient not taking: Reported on 04/22/2020 08/28/19   Erenest Rasher, PA-C  QUEtiapine (SEROQUEL XR) 400 MG 24 hr tablet Take 400 mg by mouth at bedtime.    [provider]   Vitamin D, Ergocalciferol, (DRISDOL) 1.25 MG (50000 UT) CAPS capsule Take 50,000 Units by mouth every 30 (thirty) days.    [provider]  vitamin E 200 UNIT capsule Take 200 Units by mouth every morning.    [provider]  zolpidem (AMBIEN) 5 MG tablet Take 5 mg by mouth at bedtime.     [provider]    Allergies as of 08/27/2020  . (No Known Allergies)    Family History  Family history unknown: Yes    Social History   Socioeconomic History  . Marital status: Single    Spouse name: Not on file  . Number of children: 0  . Years of education: Not on file  . Highest education level: Not on file  Occupational History  . Not on file  Tobacco Use  . Smoking status: Never Smoker  . Smokeless tobacco: Never Used  Vaping Use  . Vaping Use: Never used  Substance and Sexual Activity  . Alcohol use: No  . Drug use: No  . Sexual activity: Never  Other Topics Concern  . Not on file  Social History Narrative  . Not on file   Social Determinants of Health   Financial Resource Strain:   . Difficulty of Paying Living Expenses: Not on file  Food Insecurity:   . Worried About Charity fundraiser in the Last Year: Not on file  . Ran Out of Food in the Last Year: Not on file  Transportation Needs:   . Lack of Transportation (Medical): Not on file  . Lack of Transportation (Non-Medical): Not on file  Physical Activity:   . Days of Exercise per Week: Not on file  . Minutes of Exercise per Session: Not on file  Stress:   . Feeling of Stress : Not on file  Social Connections:   . Frequency of Communication with Friends and Family: Not on file  . Frequency of Social Gatherings with Friends and Family: Not on file  . Attends Religious Services: Not on file  . Active Member of Clubs or Organizations: Not on file  . Attends Archivist Meetings: Not on file  . Marital Status: Not on file  Intimate Partner Violence:   . Fear of Current or  Ex-Partner: Not on file  . Emotionally Abused: Not on file  . Physically Abused: Not on file  . Sexually Abused: Not on file    Review of Systems: See HPI, otherwise negative ROS  Physical Exam: BP 133/82   Pulse 98   Temp (!) 97.1 F (36.2 C)   Ht 5\' 2"  (1.575 m)   Wt 136 lb 9.6 oz (62 kg)   BMI 24.98 kg/m  General:   Alert,   pleasant and cooperative in  NAD; appears at baseline.  Mucous differential. Neck:  Supple; no masses or thyromegaly. No significant cervical adenopathy. Lungs:  Clear throughout to auscultation.   No wheezes, crackles, or rhonchi. No acute distress. Heart:  Regular rate and rhythm; no murmurs, clicks, rubs,  or gallops. Abdomen: Non-distended, normal bowel sounds.  Soft and nontender without appreciable mass or hepatosplenomegaly.  Pulses:  Normal pulses noted. Extremities:  Without clubbing or edema. Rectal: No Mass in the rectal vault.  And stool in the rectal vault Hemoccult negative.  Impression/Plan: 44 year old gentleman mentally challenged.  GERD well controlled.  No abdominal pain today.  Likely has a chronic microcytic anemia.  History of thalassemia trait.  History of colonic adenoma. No further GI evaluation warranted at this time.  Recommendations: Continue Dexilant 60 mg daily with Pepcid for any breakthrough symptoms. Plan for surveillance colonoscopy 2025. MiraLAX as needed for any potential constipation. Office visit with [place] in 1 year      Notice: This dictation was prepared with Dragon dictation along with smaller phrase technology. Any transcriptional errors that result from this process are unintentional and may not be corrected upon review.

## 2020-09-03 DIAGNOSIS — K219 Gastro-esophageal reflux disease without esophagitis: Secondary | ICD-10-CM | POA: Diagnosis not present

## 2020-09-03 DIAGNOSIS — I10 Essential (primary) hypertension: Secondary | ICD-10-CM | POA: Diagnosis not present

## 2020-10-05 DIAGNOSIS — F72 Severe intellectual disabilities: Secondary | ICD-10-CM | POA: Diagnosis not present

## 2020-10-05 DIAGNOSIS — K219 Gastro-esophageal reflux disease without esophagitis: Secondary | ICD-10-CM | POA: Diagnosis not present

## 2020-10-05 DIAGNOSIS — I10 Essential (primary) hypertension: Secondary | ICD-10-CM | POA: Diagnosis not present

## 2020-10-07 ENCOUNTER — Other Ambulatory Visit (HOSPITAL_COMMUNITY): Payer: Self-pay

## 2020-10-07 DIAGNOSIS — D696 Thrombocytopenia, unspecified: Secondary | ICD-10-CM

## 2020-10-07 DIAGNOSIS — D509 Iron deficiency anemia, unspecified: Secondary | ICD-10-CM

## 2020-10-08 ENCOUNTER — Inpatient Hospital Stay (HOSPITAL_COMMUNITY): Payer: Medicare Other | Attending: Hematology

## 2020-10-18 ENCOUNTER — Inpatient Hospital Stay (HOSPITAL_COMMUNITY): Payer: Medicare Other | Attending: Hematology and Oncology

## 2020-10-18 ENCOUNTER — Other Ambulatory Visit: Payer: Self-pay

## 2020-10-18 DIAGNOSIS — I10 Essential (primary) hypertension: Secondary | ICD-10-CM | POA: Diagnosis not present

## 2020-10-18 DIAGNOSIS — G40209 Localization-related (focal) (partial) symptomatic epilepsy and epileptic syndromes with complex partial seizures, not intractable, without status epilepticus: Secondary | ICD-10-CM | POA: Diagnosis not present

## 2020-10-18 DIAGNOSIS — Z299 Encounter for prophylactic measures, unspecified: Secondary | ICD-10-CM | POA: Diagnosis not present

## 2020-10-18 DIAGNOSIS — D696 Thrombocytopenia, unspecified: Secondary | ICD-10-CM | POA: Insufficient documentation

## 2020-10-18 DIAGNOSIS — D509 Iron deficiency anemia, unspecified: Secondary | ICD-10-CM | POA: Diagnosis not present

## 2020-10-18 DIAGNOSIS — R569 Unspecified convulsions: Secondary | ICD-10-CM | POA: Diagnosis not present

## 2020-10-18 DIAGNOSIS — K219 Gastro-esophageal reflux disease without esophagitis: Secondary | ICD-10-CM | POA: Diagnosis not present

## 2020-10-18 LAB — COMPREHENSIVE METABOLIC PANEL
ALT: 26 U/L (ref 0–44)
AST: 19 U/L (ref 15–41)
Albumin: 3.9 g/dL (ref 3.5–5.0)
Alkaline Phosphatase: 67 U/L (ref 38–126)
Anion gap: 10 (ref 5–15)
BUN: 12 mg/dL (ref 6–20)
CO2: 24 mmol/L (ref 22–32)
Calcium: 8.9 mg/dL (ref 8.9–10.3)
Chloride: 103 mmol/L (ref 98–111)
Creatinine, Ser: 0.6 mg/dL — ABNORMAL LOW (ref 0.61–1.24)
GFR, Estimated: >60 mL/min (ref >60)
Glucose, Bld: 114 mg/dL — ABNORMAL HIGH (ref 70–99)
Potassium: 3.7 mmol/L (ref 3.5–5.1)
Sodium: 137 mmol/L (ref 135–145)
Total Bilirubin: 1 mg/dL (ref 0.3–1.2)
Total Protein: 7.1 g/dL (ref 6.5–8.1)

## 2020-10-18 LAB — CBC WITH DIFFERENTIAL/PLATELET
Abs Immature Granulocytes: 0.02 10*3/uL (ref 0.00–0.07)
Basophils Absolute: 0 10*3/uL (ref 0.0–0.1)
Basophils Relative: 0 %
Eosinophils Absolute: 0.2 10*3/uL (ref 0.0–0.5)
Eosinophils Relative: 2 %
HCT: 43.2 % (ref 39.0–52.0)
Hemoglobin: 12.4 g/dL — ABNORMAL LOW (ref 13.0–17.0)
Immature Granulocytes: 0 %
Lymphocytes Relative: 49 %
Lymphs Abs: 3.2 10*3/uL (ref 0.7–4.0)
MCH: 20.4 pg — ABNORMAL LOW (ref 26.0–34.0)
MCHC: 28.7 g/dL — ABNORMAL LOW (ref 30.0–36.0)
MCV: 71.1 fL — ABNORMAL LOW (ref 80.0–100.0)
Monocytes Absolute: 0.4 10*3/uL (ref 0.1–1.0)
Monocytes Relative: 6 %
Neutro Abs: 2.9 10*3/uL (ref 1.7–7.7)
Neutrophils Relative %: 43 %
Platelets: 94 10*3/uL — ABNORMAL LOW (ref 150–400)
RBC: 6.08 MIL/uL — ABNORMAL HIGH (ref 4.22–5.81)
RDW: 19.4 % — ABNORMAL HIGH (ref 11.5–15.5)
WBC: 6.7 10*3/uL (ref 4.0–10.5)
nRBC: 0 % (ref 0.0–0.2)

## 2020-10-18 LAB — IRON AND TIBC
Iron: 130 ug/dL (ref 45–182)
Saturation Ratios: 40 % — ABNORMAL HIGH (ref 17.9–39.5)
TIBC: 326 ug/dL (ref 250–450)
UIBC: 196 ug/dL

## 2020-10-18 LAB — FERRITIN: Ferritin: 706 ng/mL — ABNORMAL HIGH (ref 24–336)

## 2020-10-18 LAB — VITAMIN B12: Vitamin B-12: 307 pg/mL (ref 180–914)

## 2020-10-18 LAB — LACTATE DEHYDROGENASE: LDH: 109 U/L (ref 98–192)

## 2020-10-18 LAB — VITAMIN D 25 HYDROXY (VIT D DEFICIENCY, FRACTURES): Vit D, 25-Hydroxy: 33.53 ng/mL (ref 30–100)

## 2020-10-19 LAB — HAPTOGLOBIN: Haptoglobin: 75 mg/dL (ref 23–355)

## 2020-10-22 ENCOUNTER — Inpatient Hospital Stay (HOSPITAL_COMMUNITY): Payer: Medicare Other | Admitting: Hematology and Oncology

## 2020-11-05 DIAGNOSIS — K219 Gastro-esophageal reflux disease without esophagitis: Secondary | ICD-10-CM | POA: Diagnosis not present

## 2020-11-05 DIAGNOSIS — I10 Essential (primary) hypertension: Secondary | ICD-10-CM | POA: Diagnosis not present

## 2020-11-09 ENCOUNTER — Ambulatory Visit (HOSPITAL_COMMUNITY): Payer: Medicare Other | Admitting: Oncology

## 2020-11-09 ENCOUNTER — Inpatient Hospital Stay (HOSPITAL_COMMUNITY): Payer: Medicare Other | Attending: Oncology | Admitting: Oncology

## 2020-11-09 VITALS — BP 125/72 | HR 83 | Temp 97.5°F | Resp 18 | Wt 154.0 lb

## 2020-11-09 DIAGNOSIS — Z79899 Other long term (current) drug therapy: Secondary | ICD-10-CM | POA: Insufficient documentation

## 2020-11-09 DIAGNOSIS — K219 Gastro-esophageal reflux disease without esophagitis: Secondary | ICD-10-CM | POA: Diagnosis not present

## 2020-11-09 DIAGNOSIS — R109 Unspecified abdominal pain: Secondary | ICD-10-CM | POA: Diagnosis not present

## 2020-11-09 DIAGNOSIS — D696 Thrombocytopenia, unspecified: Secondary | ICD-10-CM | POA: Insufficient documentation

## 2020-11-09 DIAGNOSIS — D509 Iron deficiency anemia, unspecified: Secondary | ICD-10-CM | POA: Diagnosis not present

## 2020-11-09 NOTE — Progress Notes (Signed)
Cole Stewart, Cole Stewart   CLINIC:  Medical Oncology/Hematology  PCP:  Monico Blitz, MD [address] [place] [address] [phone]   REASON FOR VISIT: Follow-up for microcytic anemia and thrombocytopenia   CURRENT THERAPY: Observation   INTERVAL HISTORY:  Cole Stewart 45 y.o. male returns for routine follow-up for microcytic anemia and thrombocytopenia.   Patient was last seen in clinic on 04/22/2020.  He reported feeling well.  He was referred to GI for abdominal pain.  He was seen by Dr. Gala Romney on 08/27/2020.  He was instructed to continue Dexilant 60 mg daily with Pepcid for breakthrough GERD.  Colonoscopy scheduled for 2025.  MiraLAX needed for constipation.  Follow-up in 1 year.  Since then, he has been doing well.  He is accompanied by his caregiver. He denies any bright red bleeding per rectum or melena.  He denies any easy bruising or bleeding.  He does report abdominal pain only after eating.  Denies any nausea, vomiting, or diarrhea. Denies any new pains. Had not noticed any recent bleeding such as epistaxis, hematuria or hematochezia. Denies recent chest pain on exertion, shortness of breath on minimal exertion, pre-syncopal episodes, or palpitations. Denies any numbness or tingling in hands or feet. Denies any recent fevers, infections, or recent hospitalizations. Patient reports appetite at 100% and energy level at 75%.  He is eating well maintain his weight at this time.   REVIEW OF SYSTEMS:  Review of Systems  Constitutional: Negative.  Negative for appetite change, chills, fatigue and fever.  HENT:  Negative.  Negative for hearing loss, lump/mass, mouth sores and nosebleeds.   Eyes: Negative.  Negative for eye problems.  Respiratory: Negative for cough, hemoptysis and shortness of breath.   Cardiovascular: Negative.  Negative for chest pain and leg swelling.  Gastrointestinal: Negative.  Negative for abdominal pain, blood in  stool, constipation, diarrhea, nausea and vomiting.  Endocrine: Negative.  Negative for hot flashes.  Genitourinary: Negative.  Negative for bladder incontinence, difficulty urinating, dysuria, frequency and hematuria.   Musculoskeletal: Negative.  Negative for back pain, flank pain, gait problem and myalgias.  Skin: Negative.  Negative for itching and rash.  Neurological: Negative.  Negative for dizziness, gait problem, headaches, light-headedness and numbness.  Hematological: Negative.  Negative for adenopathy.  Psychiatric/Behavioral: Negative for confusion. The patient is not nervous/anxious.      PAST MEDICAL/SURGICAL HISTORY:  Past Medical History:  Diagnosis Date  . Anemia   . GERD (gastroesophageal reflux disease)   . Mental retardation   . Mood disorder ([place])   . MVP (mitral valve prolapse)   . Recurrent abdominal pain   . Seizures ([place])    last seizure was 2010.Unknown etiology  . Thrombocytopenia ([place])    Past Surgical History:  Procedure Laterality Date  . CHOLECYSTECTOMY    . COLONOSCOPY WITH PROPOFOL N/A 12/05/2018   PROPOFOL;  Surgeon: Daneil Dolin, MD;  One 5 mm polyp in the cecum.  Tubular adenoma.  Due for repeat in 2025.  Marland Kitchen ESOPHAGOGASTRODUODENOSCOPY (EGD) WITH PROPOFOL N/A 07/10/2019   PROPOFOL;  Surgeon: Daneil Dolin, MD; normal exam  . POLYPECTOMY  12/05/2018   Procedure: POLYPECTOMY;  Surgeon: Daneil Dolin, MD;  [facility];  Service: Endoscopy;;  colon     SOCIAL HISTORY:  Social History   Socioeconomic History  . Marital status: Single    Spouse name: Not on file  . Number of children: 0  . Years  of education: Not on file  . Highest education level: Not on file  Occupational History  . Not on file  Tobacco Use  . Smoking status: Never Smoker  . Smokeless tobacco: Never Used  Vaping Use  . Vaping Use: Never used  Substance and Sexual Activity  . Alcohol use: No  . Drug use: No  . Sexual activity: Never  Other Topics  Concern  . Not on file  Social History Narrative  . Not on file   Social Determinants of Health   Financial Resource Strain: Not on file  Food Insecurity: Not on file  Transportation Needs: Not on file  Physical Activity: Not on file  Stress: Not on file  Social Connections: Not on file  Intimate Partner Violence: Not on file    FAMILY HISTORY:  Family History  Family history unknown: Yes    CURRENT MEDICATIONS:  Outpatient Encounter Medications as of 11/09/2020  Medication Sig  . chlorhexidine (PERIDEX) 0.12 % solution Use as directed 15 mLs in the mouth or throat 2 (two) times daily.   . clonazePAM (KLONOPIN) 0.5 MG tablet Take 0.25-0.5 mg by mouth See admin instructions. Take 0.25 mg by mouth in the morning and take 0.5 mg at noon and at bedtime  . DEXILANT 60 MG capsule TAKE 1 CAPSULE BY MOUTH ONCE A DAY.  Marland Kitchen dicyclomine (BENTYL) 10 MG capsule Take 1 capsule by mouth as needed for abdominal pain or diarrhea up to 3 times daily.  Hold in the setting of constipation. (Patient taking differently: Take 1 capsule by mouth as needed for abdominal pain or diarrhea up to 3 times daily.  Hold in the setting of constipation.)  . divalproex (DEPAKOTE) 250 MG DR tablet Take 250-500 mg by mouth See admin instructions. Take 250 mg by mouth at 0700 and take 250 mg by mouth at 1200. Take 500 mg by mouth at bedtime 2000.  Marland Kitchen doxepin (SINEQUAN) 10 MG capsule Take 10 mg by mouth at bedtime.  . famotidine (PEPCID) 20 MG tablet TAKE 1 TABLET BY MOUTH AT BEDTIME.  Marland Kitchen gabapentin (NEURONTIN) 400 MG capsule Take 400-1,200 mg by mouth See admin instructions. Take 400 mg by mouth in the morning and take 1200 mg by mouth at bedtime  . gabapentin (NEURONTIN) 600 MG tablet   . Iloperidone 6 MG TABS Take 6 mg by mouth daily.   . iron polysaccharides (NIFEREX) 150 MG capsule Take 150 mg by mouth daily.  Marland Kitchen ketoconazole (NIZORAL) 2 % shampoo Apply 1 application topically 2 (two) times a week. *Lather, leave for 5  minutes, then rinse*  . Olopatadine HCl 0.2 % SOLN Place 1 drop into both eyes daily.   . Omega-3 Fatty Acids (FISH OIL OMEGA-3) 1000 MG CAPS Take 2,000 mg by mouth daily.  . ondansetron (ZOFRAN) 4 MG tablet Take 4 mg by mouth at bedtime.  . polyethylene glycol powder (MIRALAX) 17 GM/SCOOP powder Take 17 g by mouth daily as needed for mild constipation.  . QUEtiapine (SEROQUEL XR) 400 MG 24 hr tablet Take 400 mg by mouth at bedtime.  . Vitamin D, Ergocalciferol, (DRISDOL) 1.25 MG (50000 UT) CAPS capsule Take 50,000 Units by mouth every 30 (thirty) days.  . vitamin E 200 UNIT capsule Take 200 Units by mouth every morning.  . zolpidem (AMBIEN) 5 MG tablet Take 5 mg by mouth at bedtime.    No facility-administered encounter medications on file as of 11/09/2020.    ALLERGIES:  No Known Allergies   PHYSICAL  EXAM:  ECOG Performance status: 1  Vitals:   11/09/20 1018  BP: 125/72  Pulse: 83  Resp: 18  Temp: (!) 97.5 F (36.4 C)  SpO2: 99%   Filed Weights   11/09/20 1018  Weight: 154 lb (69.9 kg)   Physical Exam Constitutional:      Appearance: Normal appearance. He is normal weight.  Cardiovascular:     Rate and Rhythm: Normal rate and regular rhythm.     Heart sounds: Normal heart sounds.  Pulmonary:     Effort: Pulmonary effort is normal.     Breath sounds: Normal breath sounds.  Abdominal:     General: Bowel sounds are normal.     Palpations: Abdomen is soft.  Musculoskeletal:        General: Normal range of motion.  Skin:    General: Skin is warm.  Neurological:     Mental Status: He is alert and oriented to person, place, and time. Mental status is at baseline.  Psychiatric:        Mood and Affect: Mood normal.        Behavior: Behavior normal.        Thought Content: Thought content normal.        Judgment: Judgment normal.      LABORATORY DATA:  I have reviewed the labs as listed.  CBC    Component Value Date/Time   WBC 6.7 10/18/2020 0903   RBC 6.08 (H)  10/18/2020 0903   HGB 12.4 (L) 10/18/2020 0903   HCT 43.2 10/18/2020 0903   PLT 94 (L) 10/18/2020 0903   MCV 71.1 (L) 10/18/2020 0903   MCH 20.4 (L) 10/18/2020 0903   MCHC 28.7 (L) 10/18/2020 0903   RDW 19.4 (H) 10/18/2020 0903   LYMPHSABS 3.2 10/18/2020 0903   MONOABS 0.4 10/18/2020 0903   EOSABS 0.2 10/18/2020 0903   BASOSABS 0.0 10/18/2020 0903   CMP Latest Ref Rng & Units 10/18/2020 04/15/2020 07/18/2019  Glucose 70 - 99 mg/dL 114(H) 105(H) 90  BUN 6 - 20 mg/dL _0 Creatinine 0.61 - 1.24 mg/dL 0.60(L) 0.65 0.59(L)  Sodium 135 - 145 mmol/L 137 138 139  Potassium 3.5 - 5.1 mmol/L 3.7 4.2 3.9  Chloride 98 - 111 mmol/L 103 103 105  CO2 22 - 32 mmol/L _1 Calcium 8.9 - 10.3 mg/dL 8.9 9.2 8.8(L)  Total Protein 6.5 - 8.1 g/dL 7.1 7.5 6.8  Total Bilirubin 0.3 - 1.2 mg/dL 1.0 1.2 0.8  Alkaline Phos 38 - 126 U/L 67 68 66  AST 15 - 41 U/L _2 ALT 0 - 44 U/L _3 All questions were answered to patient's stated satisfaction. Encouraged patient to call with any new concerns or questions before his next visit to the cancer center and we can certain see him sooner, if needed.     ASSESSMENT & PLAN:  1.  Microcytic anemia: -He had microcytic anemia since 2009, with hemoglobin between 11 and 12. -MCV has been between 66 and 70.  He is on Niferex 150 mg daily. -Colonoscopy on 12/05/2018 showed 1.5 mm polyp in the cecum.  Otherwise normal exam.  Pathology showed tubular adenoma. -EGD on 07/10/2019 showed normal esophagus, stomach, normal duodenal bulb and second part of duodenum. -No clear history of bleeding per rectum or melena. -Work-up including ferritin, iron panel, S92, folic acid, methylmalonic acid and copper levels were all normal.  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 -Labs from  10/18/2020 show a hemoglobin of 12.4, ferritin 706, saturation ratio 40%.  -This is an improvement on hemoglobin from previous. -We will continue to monitor.  2.   Thrombocytopenia: -He has mild thrombocytopenia since 2009. -No bleeding history including nosebleeds, hematuria or hematochezia reported.  No easy bruising. -CT scan of abdomen 10/2018 showed hepatic steatosis.  Craniocaudal spleen size is measured at 13 cm, slightly more than upper limit of normal. -Differential diagnosis for mild thrombocytopenia include immune mediated thrombocytopenia versus splenomegaly. -Nutritional work-up and infectious etiology was negative.  H. pylori IgA level was elevated with normal IgG, nonspecific. -If there is significant worsening of platelets we will consider bone marrow biopsy. -Labs from 10/18/2020 show a platelet count of 94,000 (101,000).  -Continue to monitor at this time. -He will follow-up in 6 months with repeat labs.  3.  Abdominal pain after eating: -She is followed by Dr. Gala Romney. -Currently on [place] and Pepcid for breakthrough GERD symptoms. -She has follow-up in 1 year.  Disposition: -RTC in 6 months with lab work and assessment.   Greater than 50% was spent in counseling and coordination of care with this patient including but not limited to discussion of the relevant topics above (See A&P) including, but not limited to diagnosis and management of acute and chronic medical conditions.   No problem-specific Assessment & Plan notes found for this encounter.  Orders placed this encounter:  No orders of the defined types were placed in this encounter.  Faythe Casa, NP 11/10/2020 8:35 AM  [place] [phone]

## 2020-12-01 DIAGNOSIS — F419 Anxiety disorder, unspecified: Secondary | ICD-10-CM | POA: Diagnosis not present

## 2021-01-03 DIAGNOSIS — I10 Essential (primary) hypertension: Secondary | ICD-10-CM | POA: Diagnosis not present

## 2021-01-03 DIAGNOSIS — K219 Gastro-esophageal reflux disease without esophagitis: Secondary | ICD-10-CM | POA: Diagnosis not present

## 2021-01-17 DIAGNOSIS — K219 Gastro-esophageal reflux disease without esophagitis: Secondary | ICD-10-CM | POA: Diagnosis not present

## 2021-01-17 DIAGNOSIS — Z789 Other specified health status: Secondary | ICD-10-CM | POA: Diagnosis not present

## 2021-01-17 DIAGNOSIS — Z299 Encounter for prophylactic measures, unspecified: Secondary | ICD-10-CM | POA: Diagnosis not present

## 2021-01-17 DIAGNOSIS — D696 Thrombocytopenia, unspecified: Secondary | ICD-10-CM | POA: Diagnosis not present

## 2021-01-17 DIAGNOSIS — I10 Essential (primary) hypertension: Secondary | ICD-10-CM | POA: Diagnosis not present

## 2021-01-17 DIAGNOSIS — R569 Unspecified convulsions: Secondary | ICD-10-CM | POA: Diagnosis not present

## 2021-01-17 DIAGNOSIS — Z6825 Body mass index (BMI) 25.0-25.9, adult: Secondary | ICD-10-CM | POA: Diagnosis not present

## 2021-01-25 DIAGNOSIS — Z299 Encounter for prophylactic measures, unspecified: Secondary | ICD-10-CM | POA: Diagnosis not present

## 2021-01-25 DIAGNOSIS — R569 Unspecified convulsions: Secondary | ICD-10-CM | POA: Diagnosis not present

## 2021-01-25 DIAGNOSIS — I10 Essential (primary) hypertension: Secondary | ICD-10-CM | POA: Diagnosis not present

## 2021-01-25 DIAGNOSIS — G40209 Localization-related (focal) (partial) symptomatic epilepsy and epileptic syndromes with complex partial seizures, not intractable, without status epilepticus: Secondary | ICD-10-CM | POA: Diagnosis not present

## 2021-01-25 DIAGNOSIS — B351 Tinea unguium: Secondary | ICD-10-CM | POA: Diagnosis not present

## 2021-02-08 DIAGNOSIS — M79671 Pain in right foot: Secondary | ICD-10-CM | POA: Diagnosis not present

## 2021-02-08 DIAGNOSIS — M79675 Pain in left toe(s): Secondary | ICD-10-CM | POA: Diagnosis not present

## 2021-02-08 DIAGNOSIS — M79674 Pain in right toe(s): Secondary | ICD-10-CM | POA: Diagnosis not present

## 2021-02-08 DIAGNOSIS — L609 Nail disorder, unspecified: Secondary | ICD-10-CM | POA: Diagnosis not present

## 2021-02-08 DIAGNOSIS — M79672 Pain in left foot: Secondary | ICD-10-CM | POA: Diagnosis not present

## 2021-02-08 DIAGNOSIS — I739 Peripheral vascular disease, unspecified: Secondary | ICD-10-CM | POA: Diagnosis not present

## 2021-02-14 DIAGNOSIS — H43393 Other vitreous opacities, bilateral: Secondary | ICD-10-CM | POA: Diagnosis not present

## 2021-02-14 DIAGNOSIS — H524 Presbyopia: Secondary | ICD-10-CM | POA: Diagnosis not present

## 2021-02-23 DIAGNOSIS — F419 Anxiety disorder, unspecified: Secondary | ICD-10-CM | POA: Diagnosis not present

## 2021-04-04 DIAGNOSIS — I10 Essential (primary) hypertension: Secondary | ICD-10-CM | POA: Diagnosis not present

## 2021-04-04 DIAGNOSIS — K219 Gastro-esophageal reflux disease without esophagitis: Secondary | ICD-10-CM | POA: Diagnosis not present

## 2021-05-23 ENCOUNTER — Other Ambulatory Visit (HOSPITAL_COMMUNITY): Payer: Self-pay | Admitting: *Deleted

## 2021-05-23 DIAGNOSIS — D509 Iron deficiency anemia, unspecified: Secondary | ICD-10-CM

## 2021-05-23 DIAGNOSIS — D696 Thrombocytopenia, unspecified: Secondary | ICD-10-CM

## 2021-05-24 ENCOUNTER — Other Ambulatory Visit: Payer: Self-pay

## 2021-05-24 ENCOUNTER — Inpatient Hospital Stay (HOSPITAL_COMMUNITY): Payer: Medicare Other | Attending: Hematology

## 2021-05-24 DIAGNOSIS — D509 Iron deficiency anemia, unspecified: Secondary | ICD-10-CM | POA: Diagnosis not present

## 2021-05-24 DIAGNOSIS — D696 Thrombocytopenia, unspecified: Secondary | ICD-10-CM | POA: Diagnosis not present

## 2021-05-24 DIAGNOSIS — K635 Polyp of colon: Secondary | ICD-10-CM | POA: Diagnosis not present

## 2021-05-24 DIAGNOSIS — Z79899 Other long term (current) drug therapy: Secondary | ICD-10-CM | POA: Diagnosis not present

## 2021-05-24 LAB — CBC WITH DIFFERENTIAL/PLATELET
Abs Immature Granulocytes: 0.03 10*3/uL (ref 0.00–0.07)
Basophils Absolute: 0 10*3/uL (ref 0.0–0.1)
Basophils Relative: 0 %
Eosinophils Absolute: 0.1 10*3/uL (ref 0.0–0.5)
Eosinophils Relative: 1 %
HCT: 39.4 % (ref 39.0–52.0)
Hemoglobin: 11.7 g/dL — ABNORMAL LOW (ref 13.0–17.0)
Immature Granulocytes: 0 %
Lymphocytes Relative: 33 %
Lymphs Abs: 2.8 10*3/uL (ref 0.7–4.0)
MCH: 20.9 pg — ABNORMAL LOW (ref 26.0–34.0)
MCHC: 29.7 g/dL — ABNORMAL LOW (ref 30.0–36.0)
MCV: 70.2 fL — ABNORMAL LOW (ref 80.0–100.0)
Monocytes Absolute: 0.9 10*3/uL (ref 0.1–1.0)
Monocytes Relative: 10 %
Neutro Abs: 4.7 10*3/uL (ref 1.7–7.7)
Neutrophils Relative %: 56 %
Platelets: 94 10*3/uL — ABNORMAL LOW (ref 150–400)
RBC: 5.61 MIL/uL (ref 4.22–5.81)
RDW: 18.3 % — ABNORMAL HIGH (ref 11.5–15.5)
WBC: 8.4 10*3/uL (ref 4.0–10.5)
nRBC: 0.2 % (ref 0.0–0.2)

## 2021-05-24 LAB — FERRITIN: Ferritin: 845 ng/mL — ABNORMAL HIGH (ref 24–336)

## 2021-05-24 LAB — COMPREHENSIVE METABOLIC PANEL
ALT: 39 U/L (ref 0–44)
AST: 29 U/L (ref 15–41)
Albumin: 4 g/dL (ref 3.5–5.0)
Alkaline Phosphatase: 67 U/L (ref 38–126)
Anion gap: 9 (ref 5–15)
BUN: 16 mg/dL (ref 6–20)
CO2: 25 mmol/L (ref 22–32)
Calcium: 9.3 mg/dL (ref 8.9–10.3)
Chloride: 100 mmol/L (ref 98–111)
Creatinine, Ser: 0.68 mg/dL (ref 0.61–1.24)
GFR, Estimated: >60 mL/min (ref >60)
Glucose, Bld: 84 mg/dL (ref 70–99)
Potassium: 3.8 mmol/L (ref 3.5–5.1)
Sodium: 134 mmol/L — ABNORMAL LOW (ref 135–145)
Total Bilirubin: 1.1 mg/dL (ref 0.3–1.2)
Total Protein: 7.3 g/dL (ref 6.5–8.1)

## 2021-05-24 LAB — LACTATE DEHYDROGENASE: LDH: 139 U/L (ref 98–192)

## 2021-05-24 LAB — IRON AND TIBC
Iron: 49 ug/dL (ref 45–182)
Saturation Ratios: 16 % — ABNORMAL LOW (ref 17.9–39.5)
TIBC: 315 ug/dL (ref 250–450)
UIBC: 266 ug/dL

## 2021-05-24 LAB — VITAMIN B12: Vitamin B-12: 247 pg/mL (ref 180–914)

## 2021-05-30 NOTE — Progress Notes (Signed)
Cole Stewart, [place] [id number]   CLINIC:  Medical Oncology/Hematology  PCP:  Monico Blitz, MD [address] [place] [address]  [phone]  REASON FOR VISIT:  Follow-up for microcytic anemia and thrombocytopenia  PRIOR THERAPY: none  CURRENT THERAPY: surveillance  Virtual Visit via Video Note  I connected with Cole Stewart on '@TODAY'$ @ at  1:45 PM EDT by a video enabled telemedicine application and verified that I am speaking with the correct person using two identifiers.  Location: Patient: clinic Provider: home  Others present: Satira Anis, LPN   I discussed the limitations of evaluation and management by telemedicine and the availability of in person appointments. The patient expressed understanding and agreed to proceed.  INTERVAL HISTORY:  Cole Stewart, a 45 y.o. male, returns for routine follow-up for his microcytic anemia and thrombocytopenia. Thaxton was last seen on 09/02/19.  Today he reports feeling well, and he is accompanied by a caregiver who is acting as the historian for this appointment. He reports stomach pain, but had no bloody or black stools. He also denies hematuria, nosebleeds, and easy bleeding.    REVIEW OF SYSTEMS:  Review of Systems  Gastrointestinal:  Positive for abdominal pain (stomach). Negative for blood in stool.  Genitourinary:  Negative for hematuria.   Hematological:  Does not bruise/bleed easily.  All other systems reviewed and are negative.  PAST MEDICAL/SURGICAL HISTORY:  Past Medical History:  Diagnosis Date   Anemia    GERD (gastroesophageal reflux disease)    Mental retardation    Mood disorder ([place])    MVP (mitral valve prolapse)    Recurrent abdominal pain    Seizures ([place])    last seizure was 2010.Unknown etiology   Thrombocytopenia ([place])    Past Surgical History:  Procedure Laterality Date   CHOLECYSTECTOMY     COLONOSCOPY WITH PROPOFOL N/A 12/05/2018    PROPOFOL;  Surgeon: Daneil Dolin, MD;  One 5 mm polyp in the cecum.  Tubular adenoma.  Due for repeat in 2025.   ESOPHAGOGASTRODUODENOSCOPY (EGD) WITH PROPOFOL N/A 07/10/2019   PROPOFOL;  Surgeon: Daneil Dolin, MD; normal exam   POLYPECTOMY  12/05/2018   Procedure: POLYPECTOMY;  Surgeon: Daneil Dolin, MD;  [facility];  Service: Endoscopy;;  colon    SOCIAL HISTORY:  Social History   Socioeconomic History   Marital status: Single    Spouse name: Not on file   Number of children: 0   Years of education: Not on file   Highest education level: Not on file  Occupational History   Not on file  Tobacco Use   Smoking status: Never   Smokeless tobacco: Never  Vaping Use   Vaping Use: Never used  Substance and Sexual Activity   Alcohol use: No   Drug use: No   Sexual activity: Never  Other Topics Concern   Not on file  Social History Narrative   Not on file   Social Determinants of Health   Financial Resource Strain: Not on file  Food Insecurity: Not on file  Transportation Needs: Not on file  Physical Activity: Not on file  Stress: Not on file  Social Connections: Not on file  Intimate Partner Violence: Not on file    FAMILY HISTORY:  Family History  Family history unknown: Yes    CURRENT MEDICATIONS:  Current Outpatient Medications  Medication Sig Dispense Refill   chlorhexidine (PERIDEX) 0.12 % solution Use as  directed 15 mLs in the mouth or throat 2 (two) times daily.      clonazePAM (KLONOPIN) 0.5 MG tablet Take 0.25-0.5 mg by mouth See admin instructions. Take 0.25 mg by mouth in the morning and take 0.5 mg at noon and at bedtime     DEXILANT 60 MG capsule TAKE 1 CAPSULE BY MOUTH ONCE A DAY. 30 capsule 11   dicyclomine (BENTYL) 10 MG capsule Take 1 capsule by mouth as needed for abdominal pain or diarrhea up to 3 times daily.  Hold in the setting of constipation. (Patient taking differently: Take 1 capsule by mouth as needed for abdominal pain or  diarrhea up to 3 times daily.  Hold in the setting of constipation.) 90 capsule 5   divalproex (DEPAKOTE) 250 MG DR tablet Take 250-500 mg by mouth See admin instructions. Take 250 mg by mouth at 0700 and take 250 mg by mouth at 1200. Take 500 mg by mouth at bedtime 2000.     doxepin (SINEQUAN) 10 MG capsule Take 10 mg by mouth at bedtime.     famotidine (PEPCID) 20 MG tablet TAKE 1 TABLET BY MOUTH AT BEDTIME. 30 tablet 11   gabapentin (NEURONTIN) 400 MG capsule Take 400-1,200 mg by mouth See admin instructions. Take 400 mg by mouth in the morning and take 1200 mg by mouth at bedtime     gabapentin (NEURONTIN) 600 MG tablet      Iloperidone 6 MG TABS Take 6 mg by mouth daily.      iron polysaccharides (NIFEREX) 150 MG capsule Take 150 mg by mouth daily.     ketoconazole (NIZORAL) 2 % shampoo Apply 1 application topically 2 (two) times a week. *Lather, leave for 5 minutes, then rinse*     Olopatadine HCl 0.2 % SOLN Place 1 drop into both eyes daily.      Omega-3 Fatty Acids (FISH OIL OMEGA-3) 1000 MG CAPS Take 2,000 mg by mouth daily.     ondansetron (ZOFRAN) 4 MG tablet Take 4 mg by mouth at bedtime.     polyethylene glycol powder (MIRALAX) 17 GM/SCOOP powder Take 17 g by mouth daily as needed for mild constipation. 255 g 3   QUEtiapine (SEROQUEL XR) 400 MG 24 hr tablet Take 400 mg by mouth at bedtime.     Vitamin D, Ergocalciferol, (DRISDOL) 1.25 MG (50000 UT) CAPS capsule Take 50,000 Units by mouth every 30 (thirty) days.     vitamin E 200 UNIT capsule Take 200 Units by mouth every morning.     zolpidem (AMBIEN) 5 MG tablet Take 5 mg by mouth at bedtime.      No current facility-administered medications for this visit.    ALLERGIES:  No Known Allergies  Performance status (ECOG): 1 - Symptomatic but completely ambulatory  There were no vitals filed for this visit. Wt Readings from Last 3 Encounters:  11/09/20 154 lb (69.9 kg)  08/27/20 136 lb 9.6 oz (62 kg)  06/04/20 154 lb 6.4 oz (70  kg)   LABORATORY DATA:  I have reviewed the labs as listed.  CBC Latest Ref Rng & Units 05/24/2021 10/18/2020 07/14/2020  WBC 4.0 - 10.5 K/uL 8.4 6.7 8.1  Hemoglobin 13.0 - 17.0 g/dL 11.7(L) 12.4(L) 11.1(L)  Hematocrit 39.0 - 52.0 % 39.4 43.2 38.1(L)  Platelets 150 - 400 K/uL 94(L) 94(L) 101(L)   CMP Latest Ref Rng & Units 05/24/2021 10/18/2020 04/15/2020  Glucose 70 - 99 mg/dL 84 114(H) 105(H)  BUN 6 - 20 mg/dL  $'16 12 12  'a$ Creatinine 0.61 - 1.24 mg/dL 0.68 0.60(L) 0.65  Sodium 135 - 145 mmol/L 134(L) 137 138  Potassium 3.5 - 5.1 mmol/L 3.8 3.7 4.2  Chloride 98 - 111 mmol/L 100 103 103  CO2 22 - 32 mmol/L '25 24 27  '$ Calcium 8.9 - 10.3 mg/dL 9.3 8.9 9.2  Total Protein 6.5 - 8.1 g/dL 7.3 7.1 7.5  Total Bilirubin 0.3 - 1.2 mg/dL 1.1 1.0 1.2  Alkaline Phos 38 - 126 U/L 67 67 68  AST 15 - 41 U/L '29 19 22  '$ ALT 0 - 44 U/L 39 26 27      Component Value Date/Time   RBC 5.61 05/24/2021 1404   MCV 70.2 (L) 05/24/2021 1404   MCH 20.9 (L) 05/24/2021 1404   MCHC 29.7 (L) 05/24/2021 1404   RDW 18.3 (H) 05/24/2021 1404   LYMPHSABS 2.8 05/24/2021 1404   MONOABS 0.9 05/24/2021 1404   EOSABS 0.1 05/24/2021 1404   BASOSABS 0.0 05/24/2021 1404    DIAGNOSTIC IMAGING:  I have independently reviewed the scans and discussed with the patient. No results found.   ASSESSMENT:  1.  Microcytic anemia: -He had microcytic anemia since 2009, with hemoglobin between 11 and 12. -MCV has been between 66 and 70.  He is on Niferex 150 mg daily. -Colonoscopy on 12/05/2018 shows 1 5 mm polyp in the cecum.  Otherwise normal exam.  Pathology showed tubular adenoma. -EGD on 07/10/2019 shows normal esophagus, stomach, normal duodenal bulb and second part of duodenum. -No clear history of bleeding per rectum or melena.  Work-up including ferritin, iron panel, [id number], folic acid, methylmalonic acid and copper levels were normal. - Hemoglobin electrophoresis shows HbA 2-3.6%, HBF-1.2%, negative for HbS and HBc.  HbA was 95%  (96.4-98.8%).   2.  Thrombocytopenia: -He has mild thrombocytopenia since 2009. -No bleeding history including nosebleeds, hematuria reported.  No easy bruising. -CT scan of abdomen December 2019 shows hepatic steatosis.  Craniocaudal spleen size is measured at 13 cm, slightly more than upper limit of normal. -Differential diagnosis for mild thrombocytopenia includes immune mediated thrombocytopenia versus splenomegaly. -Nutritional work-up and infectious etiology was negative.  H. pylori IgA level is elevated with normal IgG, nonspecific.     PLAN:  1.  Microcytic anemia: - He has microcytic anemia with hemoglobin ranging between 11 and 12 since 2009. - Further work-up indicated that he has beta thalassemia minor with a hemoglobin A of 95 and hemoglobin A 2 of 3.6%. - He does not report any bleeding per rectum or melena.  History was obtained from the attendant from facility as patient cannot give his own history. - Reviewed labs from 05/24/2021 with hemoglobin 11.7 and MCV of 70.  This has been stable last few times.  Ferritin is 845 and percent saturation is 16.  B12 is 247.  LDH is 139 with normal bilirubin. - No intervention needed at this time.  RTC 6 months for follow-up.   2.  Thrombocytopenia: - He has mild to moderate thrombocytopenia since 2009. - CT CAP on 10/24/2018 reviewed by me showed craniocaudal length of spleen measuring 13 cm which is upper limit of normal. - Differential diagnosis includes immune mediated thrombocytopenia versus splenomegaly. - He does not have any easy bruising or bleeding at this time.  Platelet count today is 94 and stable between 9200. - We will plan to repeat CBC in 6 months.  Orders placed this encounter:  No orders of the defined types were placed in this  encounter.  I provided 20 minutes of non-face-to-face time during this encounter.  Derek Jack, MD [place] [phone]   I, Thana Ates, am acting as a scribe  for Dr. Derek Jack.  I, Derek Jack MD, have reviewed the above documentation for accuracy and completeness, and I agree with the above.

## 2021-05-31 ENCOUNTER — Other Ambulatory Visit: Payer: Self-pay

## 2021-05-31 ENCOUNTER — Inpatient Hospital Stay (HOSPITAL_BASED_OUTPATIENT_CLINIC_OR_DEPARTMENT_OTHER): Payer: Medicare Other | Admitting: Hematology

## 2021-05-31 VITALS — BP 134/76 | HR 82 | Temp 98.1°F

## 2021-05-31 DIAGNOSIS — D696 Thrombocytopenia, unspecified: Secondary | ICD-10-CM

## 2021-05-31 DIAGNOSIS — D509 Iron deficiency anemia, unspecified: Secondary | ICD-10-CM | POA: Diagnosis not present

## 2021-05-31 NOTE — Patient Instructions (Addendum)
[facility] at [facility] Discharge Instructions  You were seen today by Dr. Katragadda. He went over your recent results. Dr. Katragadda will see you back in 6 months for labs and follow up.   Thank you for choosing [facility] at [facility] to provide your oncology and hematology care.  To afford each patient quality time with our provider, please arrive at least 15 minutes before your scheduled appointment time.   If you have a lab appointment with the Cancer Center please come in thru the Main Entrance and check in at the main information desk  You need to re-schedule your appointment should you arrive 10 or more minutes late.  We strive to give you quality time with our providers, and arriving late affects you and other patients whose appointments are after yours.  Also, if you no show three or more times for appointments you may be dismissed from the clinic at the providers discretion.     Again, thank you for choosing [facility].  Our hope is that these requests will decrease the amount of time that you wait before being seen by our physicians.       _____________________________________________________________  Should you have questions after your visit to [facility], please contact our office at [phone] between the hours of 8:00 a.m. and 4:30 p.m.  Voicemails left after 4:00 p.m. will not be returned until the following business day.  For prescription refill requests, have your pharmacy contact our office and allow 72 hours.    Cancer Center Support Programs:   > Cancer Support Group  2nd Tuesday of the month 1pm-2pm, Journey Room    

## 2021-07-06 DIAGNOSIS — F419 Anxiety disorder, unspecified: Secondary | ICD-10-CM | POA: Diagnosis not present

## 2021-07-14 DIAGNOSIS — Z299 Encounter for prophylactic measures, unspecified: Secondary | ICD-10-CM | POA: Diagnosis not present

## 2021-07-14 DIAGNOSIS — I10 Essential (primary) hypertension: Secondary | ICD-10-CM | POA: Diagnosis not present

## 2021-07-14 DIAGNOSIS — Z0000 Encounter for general adult medical examination without abnormal findings: Secondary | ICD-10-CM | POA: Diagnosis not present

## 2021-07-14 DIAGNOSIS — Z7189 Other specified counseling: Secondary | ICD-10-CM | POA: Diagnosis not present

## 2021-07-14 DIAGNOSIS — Z6825 Body mass index (BMI) 25.0-25.9, adult: Secondary | ICD-10-CM | POA: Diagnosis not present

## 2021-07-14 DIAGNOSIS — Z1331 Encounter for screening for depression: Secondary | ICD-10-CM | POA: Diagnosis not present

## 2021-07-14 DIAGNOSIS — F79 Unspecified intellectual disabilities: Secondary | ICD-10-CM | POA: Diagnosis not present

## 2021-07-14 DIAGNOSIS — Z1339 Encounter for screening examination for other mental health and behavioral disorders: Secondary | ICD-10-CM | POA: Diagnosis not present

## 2021-08-01 ENCOUNTER — Other Ambulatory Visit: Payer: Self-pay | Admitting: Nurse Practitioner

## 2021-08-01 DIAGNOSIS — K219 Gastro-esophageal reflux disease without esophagitis: Secondary | ICD-10-CM

## 2021-08-01 DIAGNOSIS — R1013 Epigastric pain: Secondary | ICD-10-CM

## 2021-08-04 DIAGNOSIS — F419 Anxiety disorder, unspecified: Secondary | ICD-10-CM | POA: Diagnosis not present

## 2021-08-24 ENCOUNTER — Other Ambulatory Visit: Payer: Self-pay | Admitting: Gastroenterology

## 2021-08-24 DIAGNOSIS — R198 Other specified symptoms and signs involving the digestive system and abdomen: Secondary | ICD-10-CM

## 2021-09-05 DIAGNOSIS — K219 Gastro-esophageal reflux disease without esophagitis: Secondary | ICD-10-CM | POA: Diagnosis not present

## 2021-09-05 DIAGNOSIS — I10 Essential (primary) hypertension: Secondary | ICD-10-CM | POA: Diagnosis not present

## 2021-09-21 DIAGNOSIS — F419 Anxiety disorder, unspecified: Secondary | ICD-10-CM | POA: Diagnosis not present

## 2021-10-05 DIAGNOSIS — K219 Gastro-esophageal reflux disease without esophagitis: Secondary | ICD-10-CM | POA: Diagnosis not present

## 2021-10-05 DIAGNOSIS — I10 Essential (primary) hypertension: Secondary | ICD-10-CM | POA: Diagnosis not present

## 2021-10-12 DIAGNOSIS — G40209 Localization-related (focal) (partial) symptomatic epilepsy and epileptic syndromes with complex partial seizures, not intractable, without status epilepticus: Secondary | ICD-10-CM | POA: Diagnosis not present

## 2021-10-12 DIAGNOSIS — I10 Essential (primary) hypertension: Secondary | ICD-10-CM | POA: Diagnosis not present

## 2021-10-12 DIAGNOSIS — Z299 Encounter for prophylactic measures, unspecified: Secondary | ICD-10-CM | POA: Diagnosis not present

## 2021-10-12 DIAGNOSIS — H04129 Dry eye syndrome of unspecified lacrimal gland: Secondary | ICD-10-CM | POA: Diagnosis not present

## 2021-12-13 ENCOUNTER — Inpatient Hospital Stay (HOSPITAL_COMMUNITY): Payer: Medicare Other | Attending: Hematology

## 2021-12-13 ENCOUNTER — Other Ambulatory Visit: Payer: Self-pay

## 2021-12-13 DIAGNOSIS — D696 Thrombocytopenia, unspecified: Secondary | ICD-10-CM

## 2021-12-13 DIAGNOSIS — Z79899 Other long term (current) drug therapy: Secondary | ICD-10-CM | POA: Insufficient documentation

## 2021-12-13 DIAGNOSIS — D509 Iron deficiency anemia, unspecified: Secondary | ICD-10-CM | POA: Diagnosis not present

## 2021-12-13 LAB — CBC WITH DIFFERENTIAL/PLATELET
Abs Immature Granulocytes: 0.01 10*3/uL (ref 0.00–0.07)
Basophils Absolute: 0 10*3/uL (ref 0.0–0.1)
Basophils Relative: 0 %
Eosinophils Absolute: 0 10*3/uL (ref 0.0–0.5)
Eosinophils Relative: 0 %
HCT: 39.8 % (ref 39.0–52.0)
Hemoglobin: 11.8 g/dL — ABNORMAL LOW (ref 13.0–17.0)
Immature Granulocytes: 0 %
Lymphocytes Relative: 57 %
Lymphs Abs: 3.1 10*3/uL (ref 0.7–4.0)
MCH: 21.2 pg — ABNORMAL LOW (ref 26.0–34.0)
MCHC: 29.6 g/dL — ABNORMAL LOW (ref 30.0–36.0)
MCV: 71.5 fL — ABNORMAL LOW (ref 80.0–100.0)
Monocytes Absolute: 0.3 10*3/uL (ref 0.1–1.0)
Monocytes Relative: 5 %
Neutro Abs: 2.1 10*3/uL (ref 1.7–7.7)
Neutrophils Relative %: 38 %
Platelets: 97 10*3/uL — ABNORMAL LOW (ref 150–400)
RBC: 5.57 MIL/uL (ref 4.22–5.81)
RDW: 19.1 % — ABNORMAL HIGH (ref 11.5–15.5)
WBC: 5.5 10*3/uL (ref 4.0–10.5)
nRBC: 0 % (ref 0.0–0.2)

## 2021-12-13 LAB — VITAMIN B12: Vitamin B-12: 289 pg/mL (ref 180–914)

## 2021-12-13 LAB — FOLATE: Folate: 23.2 ng/mL (ref >5.9)

## 2021-12-13 LAB — IRON AND TIBC
Iron: 235 ug/dL — ABNORMAL HIGH (ref 45–182)
Saturation Ratios: 78 % — ABNORMAL HIGH (ref 17.9–39.5)
TIBC: 301 ug/dL (ref 250–450)
UIBC: 66 ug/dL

## 2021-12-13 LAB — FERRITIN: Ferritin: 837 ng/mL — ABNORMAL HIGH (ref 24–336)

## 2021-12-13 LAB — LACTATE DEHYDROGENASE: LDH: 135 U/L (ref 98–192)

## 2021-12-13 LAB — RETICULOCYTES
Immature Retic Fract: 25.9 % — ABNORMAL HIGH (ref 2.3–15.9)
RBC.: 5.7 MIL/uL (ref 4.22–5.81)
Retic Count, Absolute: 135.7 10*3/uL (ref 19.0–186.0)
Retic Ct Pct: 2.4 % (ref 0.4–3.1)

## 2021-12-20 ENCOUNTER — Encounter (HOSPITAL_COMMUNITY): Payer: Self-pay | Admitting: Hematology

## 2021-12-20 ENCOUNTER — Inpatient Hospital Stay (HOSPITAL_BASED_OUTPATIENT_CLINIC_OR_DEPARTMENT_OTHER): Payer: Medicare Other | Admitting: Hematology

## 2021-12-20 ENCOUNTER — Other Ambulatory Visit: Payer: Self-pay

## 2021-12-20 VITALS — BP 162/86 | HR 95 | Temp 96.9°F | Resp 18 | Wt 157.4 lb

## 2021-12-20 DIAGNOSIS — D509 Iron deficiency anemia, unspecified: Secondary | ICD-10-CM | POA: Diagnosis not present

## 2021-12-20 DIAGNOSIS — D696 Thrombocytopenia, unspecified: Secondary | ICD-10-CM

## 2021-12-20 NOTE — Patient Instructions (Signed)
[place] at [facility] Discharge Instructions  You were seen and examined today by Dr. Delton Coombes. He reviewed your most recent labs and everything is stable. Please keep follow up appointment as scheduled in 6 months.   Thank you for choosing [place] at [facility] to provide your oncology and hematology care.  To afford each patient quality time with our provider, please arrive at least 15 minutes before your scheduled appointment time.   If you have a lab appointment with the [facility] please come in thru the Main Entrance and check in at the main information desk.  You need to re-schedule your appointment should you arrive 10 or more minutes late.  We strive to give you quality time with our providers, and arriving late affects you and other patients whose appointments are after yours.  Also, if you no show three or more times for appointments you may be dismissed from the clinic at the providers discretion.     Again, thank you for choosing [facility].  Our hope is that these requests will decrease the amount of time that you wait before being seen by our physicians.       _____________________________________________________________  Should you have questions after your visit to [facility], please contact our office at [phone] and follow the prompts.  Our office hours are 8:00 a.m. and 4:30 p.m. Monday - Friday.  Please note that voicemails left after 4:00 p.m. may not be returned until the following business day.  We are closed weekends and major holidays.  You do have access to a nurse 24-7, just call the main number to the clinic [phone] and do not press any options, hold on the line and a nurse will answer the phone.    For prescription refill requests, have your pharmacy contact our office and allow 72 hours.    Due to Covid, you will need to wear a mask upon entering the hospital. If you do  not have a mask, a mask will be given to you at the Main Entrance upon arrival. For doctor visits, patients may have 1 support person age 63 or older with them. For treatment visits, patients can not have anyone with them due to social distancing guidelines and our immunocompromised population.

## 2021-12-20 NOTE — Progress Notes (Signed)
[place], [place] [address]   CLINIC:  Medical Oncology/Hematology  PCP:  Monico Blitz, MD [address] [place] [address]  [phone]  REASON FOR VISIT:  Follow-up for microcytic anemia and thrombocytopenia  PRIOR THERAPY: none  CURRENT THERAPY: surveillance  INTERVAL HISTORY:  Mr. Cole Stewart, a 46 y.o. male, returns for routine follow-up for his microcytic anemia and thrombocytopenia. San was last seen on 05/31/2021.  Today he reports feeling good. He denies easy bruising. He has had no recent infections, fevers, or night sweats.   REVIEW OF SYSTEMS:  Review of Systems  Constitutional:  Negative for appetite change, fatigue, fever and unexpected weight change.  Endocrine: Negative for hot flashes.  Hematological:  Does not bruise/bleed easily.  All other systems reviewed and are negative.  PAST MEDICAL/SURGICAL HISTORY:  Past Medical History:  Diagnosis Date   Anemia    GERD (gastroesophageal reflux disease)    Mental retardation    Mood disorder ([place])    MVP (mitral valve prolapse)    Recurrent abdominal pain    Seizures ([place])    last seizure was 2010.Unknown etiology   Thrombocytopenia ([place])    Past Surgical History:  Procedure Laterality Date   CHOLECYSTECTOMY     COLONOSCOPY WITH PROPOFOL N/A 12/05/2018   PROPOFOL;  Surgeon: Daneil Dolin, MD;  One 5 mm polyp in the cecum.  Tubular adenoma.  Due for repeat in 2025.   ESOPHAGOGASTRODUODENOSCOPY (EGD) WITH PROPOFOL N/A 07/10/2019   PROPOFOL;  Surgeon: Daneil Dolin, MD; normal exam   POLYPECTOMY  12/05/2018   Procedure: POLYPECTOMY;  Surgeon: Daneil Dolin, MD;  [facility];  Service: Endoscopy;;  colon    SOCIAL HISTORY:  Social History   Socioeconomic History   Marital status: Single    Spouse name: Not on file   Number of children: 0   Years of education: Not on file   Highest education level: Not on file  Occupational History    Not on file  Tobacco Use   Smoking status: Never   Smokeless tobacco: Never  Vaping Use   Vaping Use: Never used  Substance and Sexual Activity   Alcohol use: No   Drug use: No   Sexual activity: Never  Other Topics Concern   Not on file  Social History Narrative   Not on file   Social Determinants of Health   Financial Resource Strain: Not on file  Food Insecurity: Not on file  Transportation Needs: Not on file  Physical Activity: Not on file  Stress: Not on file  Social Connections: Not on file  Intimate Partner Violence: Not on file    FAMILY HISTORY:  Family History  Family history unknown: Yes    CURRENT MEDICATIONS:  Current Outpatient Medications  Medication Sig Dispense Refill   ACETAMINOPHEN EXTRA STRENGTH 500 MG tablet Take by mouth.     ANTACID 200-200-20 MG/5ML suspension Take by mouth.     BANOPHEN 25 MG capsule Take 25 mg by mouth at bedtime.     chlorhexidine (PERIDEX) 0.12 % solution Use as directed 15 mLs in the mouth or throat 2 (two) times daily.      clonazePAM (KLONOPIN) 0.5 MG tablet Take 0.25-0.5 mg by mouth See admin instructions. Take 0.25 mg by mouth in the morning and take 0.5 mg at noon and at bedtime     DEXILANT 60 MG capsule TAKE 1 CAPSULE BY MOUTH ONCE A  DAY. 30 capsule 11   dicyclomine (BENTYL) 10 MG capsule TAKE 1 CAPSULE UP TO 3 TIMES DAILY DAILY AS NEEDED FOR ABDOMINAL PAIN OR DIARRHEA. HOLD IN THE SETTING OF CONSTIPATION. 30 capsule 3   divalproex (DEPAKOTE) 250 MG DR tablet Take 250-500 mg by mouth See admin instructions. Take 250 mg by mouth at 0700 and take 250 mg by mouth at 1200. Take 500 mg by mouth at bedtime 2000.     docusate sodium (COLACE) 100 MG capsule Take 100 mg by mouth 2 (two) times daily.     doxepin (SINEQUAN) 10 MG capsule Take 10 mg by mouth at bedtime.     famotidine (PEPCID) 20 MG tablet TAKE 1 TABLET BY MOUTH AT BEDTIME. 30 tablet 11   gabapentin (NEURONTIN) 400 MG capsule Take 400-1,200 mg by mouth See admin  instructions. Take 400 mg by mouth in the morning and take 1200 mg by mouth at bedtime     gabapentin (NEURONTIN) 600 MG tablet      hydrocortisone cream 1 % Apply topically.     Iloperidone 6 MG TABS Take 6 mg by mouth daily.      iron polysaccharides (NIFEREX) 150 MG capsule Take 150 mg by mouth daily.     ketoconazole (NIZORAL) 2 % shampoo Apply 1 application topically 2 (two) times a week. *Lather, leave for 5 minutes, then rinse*     MILK OF MAGNESIA 1200 MG/15ML suspension SMARTSIG:Milliliter(s) By Mouth     Neomycin-Bacitracin-Polymyxin (TRIPLE ANTIBIOTIC) [phone] OINT Apply topically.     Olopatadine HCl 0.2 % SOLN Place 1 drop into both eyes daily.      Omega-3 Fatty Acids (FISH OIL OMEGA-3) 1000 MG CAPS Take 2,000 mg by mouth daily.     ondansetron (ZOFRAN) 4 MG tablet Take 4 mg by mouth at bedtime.     polyethylene glycol powder (MIRALAX) 17 GM/SCOOP powder Take 17 g by mouth daily as needed for mild constipation. 255 g 3   QUEtiapine (SEROQUEL XR) 400 MG 24 hr tablet Take 400 mg by mouth at bedtime.     TUSSIN DM 100-10 MG/5ML liquid Take by mouth.     Vitamin D, Ergocalciferol, (DRISDOL) 1.25 MG (50000 UT) CAPS capsule Take 50,000 Units by mouth every 30 (thirty) days.     vitamin E 200 UNIT capsule Take 200 Units by mouth every morning.     zolpidem (AMBIEN) 5 MG tablet Take 5 mg by mouth at bedtime.      No current facility-administered medications for this visit.    ALLERGIES:  No Known Allergies  PHYSICAL EXAM:  Performance status (ECOG): 1 - Symptomatic but completely ambulatory  There were no vitals filed for this visit. Wt Readings from Last 3 Encounters:  11/09/20 154 lb (69.9 kg)  08/27/20 136 lb 9.6 oz (62 kg)  06/04/20 154 lb 6.4 oz (70 kg)   Physical Exam Vitals reviewed.  Constitutional:      Appearance: Normal appearance.  Cardiovascular:     Rate and Rhythm: Normal rate and regular rhythm.     Pulses: Normal pulses.     Heart sounds: Normal  heart sounds.  Pulmonary:     Effort: Pulmonary effort is normal.     Breath sounds: Normal breath sounds.  Neurological:     General: No focal deficit present.     Mental Status: He is alert and oriented to person, place, and time.  Psychiatric:        Mood and Affect: Mood normal.  Behavior: Behavior normal.    LABORATORY DATA:  I have reviewed the labs as listed.  CBC Latest Ref Rng & Units 12/13/2021 05/24/2021 10/18/2020  WBC 4.0 - 10.5 K/uL 5.5 8.4 6.7  Hemoglobin 13.0 - 17.0 g/dL 11.8(L) 11.7(L) 12.4(L)  Hematocrit 39.0 - 52.0 % 39.8 39.4 43.2  Platelets 150 - 400 K/uL 97(L) 94(L) 94(L)   CMP Latest Ref Rng & Units 05/24/2021 10/18/2020 04/15/2020  Glucose 70 - 99 mg/dL 84 114(H) 105(H)  BUN 6 - 20 mg/dL 16 12 12   Creatinine 0.61 - 1.24 mg/dL 0.68 0.60(L) 0.65  Sodium 135 - 145 mmol/L 134(L) 137 138  Potassium 3.5 - 5.1 mmol/L 3.8 3.7 4.2  Chloride 98 - 111 mmol/L 100 103 103  CO2 22 - 32 mmol/L 25 24 27   Calcium 8.9 - 10.3 mg/dL 9.3 8.9 9.2  Total Protein 6.5 - 8.1 g/dL 7.3 7.1 7.5  Total Bilirubin 0.3 - 1.2 mg/dL 1.1 1.0 1.2  Alkaline Phos 38 - 126 U/L 67 67 68  AST 15 - 41 U/L 29 19 22   ALT 0 - 44 U/L 39 26 27      Component Value Date/Time   RBC 5.57 12/13/2021 1303   RBC 5.70 12/13/2021 1303   MCV 71.5 (L) 12/13/2021 1303   MCH 21.2 (L) 12/13/2021 1303   MCHC 29.6 (L) 12/13/2021 1303   RDW 19.1 (H) 12/13/2021 1303   LYMPHSABS 3.1 12/13/2021 1303   MONOABS 0.3 12/13/2021 1303   EOSABS 0.0 12/13/2021 1303   BASOSABS 0.0 12/13/2021 1303    DIAGNOSTIC IMAGING:  I have independently reviewed the scans and discussed with the patient. No results found.   ASSESSMENT:  1.  Microcytic anemia: -He had microcytic anemia since 2009, with hemoglobin between 11 and 12. -MCV has been between 66 and 70.  He is on Niferex 150 mg daily. -Colonoscopy on 12/05/2018 shows 1 5 mm polyp in the cecum.  Otherwise normal exam.  Pathology showed tubular adenoma. -EGD on  07/10/2019 shows normal esophagus, stomach, normal duodenal bulb and second part of duodenum. -No clear history of bleeding per rectum or melena.  Work-up including ferritin, iron panel, Q75, folic acid, methylmalonic acid and copper levels were normal. - Hemoglobin electrophoresis shows HbA 2-3.6%, HBF-1.2%, negative for HbS and HBc.  HbA was 95% (96.4-98.8%).   2.  Thrombocytopenia: -He has mild thrombocytopenia since 2009. -No bleeding history including nosebleeds, hematuria reported.  No easy bruising. -CT scan of abdomen December 2019 shows hepatic steatosis.  Craniocaudal spleen size is measured at 13 cm, slightly more than upper limit of normal. -Differential diagnosis for mild thrombocytopenia includes immune mediated thrombocytopenia versus splenomegaly. -Nutritional work-up and infectious etiology was negative.  H. pylori IgA level is elevated with normal IgG, nonspecific.     PLAN:  1.  Microcytic anemia: - He does not report any bleeding per rectum or melena. - We reviewed blood work from 12/13/2021 which showed hemoglobin stable around 11.8 with MCV 71.  Ferritin was 837 and percent saturation was 78.  Previously percent saturation was below 50.  F16 and folic acid was normal. - Anemia stable.  No further work-up necessary. - We will plan to repeat ferritin and iron panel at next visit in 6 months.  If the percent saturation remains high, will consider checking for hemochromatosis. - He had multiple elevated ferritin this in the past, thought to be secondary to fatty liver.   2.  Thrombocytopenia: - He has mild to moderate thrombocytopenia since  2009. - Differential diagnosis includes immune mediated thrombocytopenia versus splenomegaly, borderline measuring 13 cm. - Most recent CBC shows platelet count is stable at 97.  No intervention necessary.  Orders placed this encounter:  No orders of the defined types were placed in this encounter.    Derek Jack, MD [place] [phone]   I, Thana Ates, am acting as a scribe for Dr. Derek Jack.  I, Derek Jack MD, have reviewed the above documentation for accuracy and completeness, and I agree with the above.

## 2022-05-25 DIAGNOSIS — K2970 Gastritis, unspecified, without bleeding: Secondary | ICD-10-CM | POA: Diagnosis not present

## 2022-05-25 DIAGNOSIS — Z789 Other specified health status: Secondary | ICD-10-CM | POA: Diagnosis not present

## 2022-05-25 DIAGNOSIS — Z299 Encounter for prophylactic measures, unspecified: Secondary | ICD-10-CM | POA: Diagnosis not present

## 2022-05-25 DIAGNOSIS — I10 Essential (primary) hypertension: Secondary | ICD-10-CM | POA: Diagnosis not present

## 2022-06-06 DIAGNOSIS — Z79899 Other long term (current) drug therapy: Secondary | ICD-10-CM | POA: Diagnosis not present

## 2022-06-06 DIAGNOSIS — Z789 Other specified health status: Secondary | ICD-10-CM | POA: Diagnosis not present

## 2022-06-06 DIAGNOSIS — I10 Essential (primary) hypertension: Secondary | ICD-10-CM | POA: Diagnosis not present

## 2022-06-06 DIAGNOSIS — Z0000 Encounter for general adult medical examination without abnormal findings: Secondary | ICD-10-CM | POA: Diagnosis not present

## 2022-06-06 DIAGNOSIS — E7800 Pure hypercholesterolemia, unspecified: Secondary | ICD-10-CM | POA: Diagnosis not present

## 2022-06-06 DIAGNOSIS — R5383 Other fatigue: Secondary | ICD-10-CM | POA: Diagnosis not present

## 2022-06-06 DIAGNOSIS — Z299 Encounter for prophylactic measures, unspecified: Secondary | ICD-10-CM | POA: Diagnosis not present

## 2022-06-19 ENCOUNTER — Inpatient Hospital Stay: Payer: Medicare Other | Attending: Hematology

## 2022-06-19 DIAGNOSIS — Z79899 Other long term (current) drug therapy: Secondary | ICD-10-CM | POA: Diagnosis not present

## 2022-06-19 DIAGNOSIS — D696 Thrombocytopenia, unspecified: Secondary | ICD-10-CM | POA: Insufficient documentation

## 2022-06-19 DIAGNOSIS — D509 Iron deficiency anemia, unspecified: Secondary | ICD-10-CM | POA: Insufficient documentation

## 2022-06-19 DIAGNOSIS — D563 Thalassemia minor: Secondary | ICD-10-CM | POA: Insufficient documentation

## 2022-06-19 LAB — CBC WITH DIFFERENTIAL/PLATELET
Abs Immature Granulocytes: 0.01 10*3/uL (ref 0.00–0.07)
Basophils Absolute: 0 10*3/uL (ref 0.0–0.1)
Basophils Relative: 0 %
Eosinophils Absolute: 0 10*3/uL (ref 0.0–0.5)
Eosinophils Relative: 0 %
HCT: 37.9 % — ABNORMAL LOW (ref 39.0–52.0)
Hemoglobin: 11.3 g/dL — ABNORMAL LOW (ref 13.0–17.0)
Immature Granulocytes: 0 %
Lymphocytes Relative: 56 %
Lymphs Abs: 3 10*3/uL (ref 0.7–4.0)
MCH: 20.5 pg — ABNORMAL LOW (ref 26.0–34.0)
MCHC: 29.8 g/dL — ABNORMAL LOW (ref 30.0–36.0)
MCV: 68.9 fL — ABNORMAL LOW (ref 80.0–100.0)
Monocytes Absolute: 0.4 10*3/uL (ref 0.1–1.0)
Monocytes Relative: 8 %
Neutro Abs: 1.9 10*3/uL (ref 1.7–7.7)
Neutrophils Relative %: 36 %
Platelets: 61 10*3/uL — ABNORMAL LOW (ref 150–400)
RBC: 5.5 MIL/uL (ref 4.22–5.81)
RDW: 19.6 % — ABNORMAL HIGH (ref 11.5–15.5)
WBC: 5.4 10*3/uL (ref 4.0–10.5)
nRBC: 0.7 % — ABNORMAL HIGH (ref 0.0–0.2)

## 2022-06-19 LAB — IRON AND TIBC
Iron: 210 ug/dL — ABNORMAL HIGH (ref 45–182)
Saturation Ratios: 69 % — ABNORMAL HIGH (ref 17.9–39.5)
TIBC: 303 ug/dL (ref 250–450)
UIBC: 93 ug/dL

## 2022-06-19 LAB — FERRITIN: Ferritin: 763 ng/mL — ABNORMAL HIGH (ref 24–336)

## 2022-06-19 LAB — LACTATE DEHYDROGENASE: LDH: 118 U/L (ref 98–192)

## 2022-06-23 NOTE — Progress Notes (Unsigned)
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  He was last seen by Dr. Delton Coombes on 12/20/2021.  He presents today with his caregiver.  Due to underlying cognitive deficit, patient is poor historian.  He does indicate that he has some abdominal pain, which is chronic per group home staff member.  No abnormal bleeding, bruising, or petechial rash.  No fever, chills, night sweats, unintentional weight loss.  He continues to take iron polysaccharide's twice daily.  He has 60% energy and 100% appetite. He endorses that he is maintaining a stable weight.   REVIEW OF SYSTEMS:  Review of Systems  Constitutional:  Positive for fatigue. Negative for appetite change, chills, diaphoresis, fever and unexpected weight change.  HENT:   Negative for lump/mass and nosebleeds.   Eyes:  Negative for eye problems.  Respiratory:  Negative for cough, hemoptysis and shortness of breath.   Cardiovascular:  Negative for chest pain, leg swelling and palpitations.  Gastrointestinal:  Positive for abdominal pain. Negative for blood in stool, constipation, diarrhea, nausea and vomiting.  Genitourinary:  Negative for hematuria.   Skin: Negative.   Neurological:  Negative for dizziness, headaches and light-headedness.  Hematological:  Does not bruise/bleed easily.      PAST MEDICAL/SURGICAL HISTORY:  Past Medical History:  Diagnosis Date   Anemia    GERD (gastroesophageal reflux disease)    Mental retardation    Mood disorder ([place])    MVP (mitral valve prolapse)    Recurrent abdominal pain    Seizures  ([place])    last seizure was 2010.Unknown etiology   Thrombocytopenia ([place])    Past Surgical History:  Procedure Laterality Date   CHOLECYSTECTOMY     COLONOSCOPY WITH PROPOFOL N/A 12/05/2018   PROPOFOL;  Surgeon: Daneil Dolin, MD;  One 5 mm polyp in the cecum.  Tubular adenoma.  Due for repeat in 2025.   ESOPHAGOGASTRODUODENOSCOPY (EGD) WITH PROPOFOL N/A 07/10/2019   PROPOFOL;  Surgeon: Daneil Dolin, MD; normal exam   POLYPECTOMY  12/05/2018   Procedure: POLYPECTOMY;  Surgeon: Daneil Dolin, MD;  Location: [facility];  Service: Endoscopy;;  colon     SOCIAL HISTORY:  Social History   Socioeconomic History   Marital status: Single    Spouse name: Not on file   Number of children: 0   Years of education: Not on file   Highest education level: Not on file  Occupational History   Not on file  Tobacco Use   Smoking status: Never   Smokeless tobacco: Never  Vaping Use   Vaping Use: Never used  Substance and Sexual Activity   Alcohol use: No   Drug use: No   Sexual activity: Never  Other Topics Concern   Not on file  Social History Narrative   Not on file   Social Determinants of Health   Financial Resource Strain: Not on file  Food Insecurity: Not on file  Transportation Needs: Not on file  Physical Activity: Not on file  Stress: Not on file  Social Connections: Not on file  Intimate Partner Violence: Not on file    FAMILY  HISTORY:  Family History  Family history unknown: Yes    CURRENT MEDICATIONS:  Outpatient Encounter Medications as of 06/26/2022  Medication Sig   ACETAMINOPHEN EXTRA STRENGTH 500 MG tablet Take by mouth.   ANTACID 200-200-20 MG/5ML suspension Take by mouth.   BANOPHEN 25 MG capsule Take 25 mg by mouth at bedtime.   chlorhexidine (PERIDEX) 0.12 % solution Use as directed 15 mLs in the mouth or throat 2 (two) times daily.    clonazePAM (KLONOPIN) 0.5 MG tablet Take 0.25-0.5 mg by mouth See admin instructions. Take 0.25 mg by mouth in the  morning and take 0.5 mg at noon and at bedtime   DEXILANT 60 MG capsule TAKE 1 CAPSULE BY MOUTH ONCE A DAY.   dicyclomine (BENTYL) 10 MG capsule TAKE 1 CAPSULE UP TO 3 TIMES DAILY DAILY AS NEEDED FOR ABDOMINAL PAIN OR DIARRHEA. HOLD IN THE SETTING OF CONSTIPATION.   divalproex (DEPAKOTE) 250 MG DR tablet Take 250-500 mg by mouth See admin instructions. Take 250 mg by mouth at 0700 and take 250 mg by mouth at 1200. Take 500 mg by mouth at bedtime 2000.   docusate sodium (COLACE) 100 MG capsule Take 100 mg by mouth 2 (two) times daily.   doxepin (SINEQUAN) 10 MG capsule Take 10 mg by mouth at bedtime.   famotidine (PEPCID) 20 MG tablet TAKE 1 TABLET BY MOUTH AT BEDTIME.   gabapentin (NEURONTIN) 400 MG capsule Take 400-1,200 mg by mouth See admin instructions. Take 400 mg by mouth in the morning and take 1200 mg by mouth at bedtime   gabapentin (NEURONTIN) 600 MG tablet    hydrocortisone cream 1 % Apply topically.   Iloperidone 6 MG TABS Take 6 mg by mouth daily.    iron polysaccharides (NIFEREX) 150 MG capsule Take 150 mg by mouth daily.   ketoconazole (NIZORAL) 2 % shampoo Apply 1 application topically 2 (two) times a week. *Lather, leave for 5 minutes, then rinse*   MILK OF MAGNESIA 1200 MG/15ML suspension SMARTSIG:Milliliter(s) By Mouth   Neomycin-Bacitracin-Polymyxin (TRIPLE ANTIBIOTIC) [phone] OINT Apply topically.   Olopatadine HCl 0.2 % SOLN Place 1 drop into both eyes daily.    Omega-3 Fatty Acids (FISH OIL OMEGA-3) 1000 MG CAPS Take 2,000 mg by mouth daily.   ondansetron (ZOFRAN) 4 MG tablet Take 4 mg by mouth at bedtime.   polyethylene glycol powder (MIRALAX) 17 GM/SCOOP powder Take 17 g by mouth daily as needed for mild constipation.   QUEtiapine (SEROQUEL XR) 400 MG 24 hr tablet Take 400 mg by mouth at bedtime.   TUSSIN DM 100-10 MG/5ML liquid Take by mouth.   Vitamin D, Ergocalciferol, (DRISDOL) 1.25 MG (50000 UT) CAPS capsule Take 50,000 Units by mouth every 30 (thirty) days.    vitamin E 200 UNIT capsule Take 200 Units by mouth every morning.   zolpidem (AMBIEN) 5 MG tablet Take 5 mg by mouth at bedtime.    No facility-administered encounter medications on file as of 06/26/2022.    ALLERGIES:  No Known Allergies   PHYSICAL EXAM:  ECOG PERFORMANCE STATUS: 1 - Symptomatic but completely ambulatory  There were no vitals filed for this visit. There were no vitals filed for this visit. Physical Exam Constitutional:      Appearance: Normal appearance. He is obese.  HENT:     Head: Normocephalic and atraumatic.     Mouth/Throat:     Mouth: Mucous membranes are moist.  Eyes:     Extraocular Movements: Extraocular movements intact.  Pupils: Pupils are equal, round, and reactive to light.  Cardiovascular:     Rate and Rhythm: Normal rate and regular rhythm.     Pulses: Normal pulses.     Heart sounds: Normal heart sounds.  Pulmonary:     Effort: Pulmonary effort is normal.     Breath sounds: Normal breath sounds.  Abdominal:     General: Bowel sounds are normal.     Palpations: Abdomen is soft.     Tenderness: There is no abdominal tenderness.  Musculoskeletal:        General: No swelling.     Right lower leg: No edema.     Left lower leg: No edema.  Lymphadenopathy:     Cervical: No cervical adenopathy.  Skin:    General: Skin is warm and dry.  Neurological:     Mental Status: He is alert. Mental status is at baseline. He is disoriented.  Psychiatric:        Mood and Affect: Mood normal.        Behavior: Behavior normal.     LABORATORY DATA:  I have reviewed the labs as listed.  CBC    Component Value Date/Time   WBC 5.4 06/19/2022 1111   RBC 5.50 06/19/2022 1111   HGB 11.3 (L) 06/19/2022 1111   HCT 37.9 (L) 06/19/2022 1111   PLT 61 (L) 06/19/2022 1111   MCV 68.9 (L) 06/19/2022 1111   MCH 20.5 (L) 06/19/2022 1111   MCHC 29.8 (L) 06/19/2022 1111   RDW 19.6 (H) 06/19/2022 1111   LYMPHSABS 3.0 06/19/2022 1111   MONOABS 0.4 06/19/2022  1111   EOSABS 0.0 06/19/2022 1111   BASOSABS 0.0 06/19/2022 1111      Latest Ref Rng & Units 05/24/2021    2:04 PM 10/18/2020    9:03 AM 04/15/2020   11:10 AM  CMP  Glucose 70 - 99 mg/dL 84  114  105   BUN 6 - 20 mg/dL '16  12  12   '$ Creatinine 0.61 - 1.24 mg/dL 0.68  0.60  0.65   Sodium 135 - 145 mmol/L 134  137  138   Potassium 3.5 - 5.1 mmol/L 3.8  3.7  4.2   Chloride 98 - 111 mmol/L 100  103  103   CO2 22 - 32 mmol/L '25  24  27   '$ Calcium 8.9 - 10.3 mg/dL 9.3  8.9  9.2   Total Protein 6.5 - 8.1 g/dL 7.3  7.1  7.5   Total Bilirubin 0.3 - 1.2 mg/dL 1.1  1.0  1.2   Alkaline Phos 38 - 126 U/L 67  67  68   AST 15 - 41 U/L '29  19  22   '$ ALT 0 - 44 U/L 39  26  27     DIAGNOSTIC IMAGING:  I have independently reviewed the relevant imaging and discussed with the patient.  ASSESSMENT & PLAN: 1.  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 - No history of bright red blood per rectum or melena. - Colonoscopy on 12/05/2018 shows 1 5 mm polyp in the cecum.  Otherwise normal exam.  Pathology showed tubular adenoma. - EGD on 07/10/2019 shows normal esophagus, stomach, normal duodenal bulb and  second part of duodenum. - Hemoglobin electrophoresis consistent with beta thalassemia minor - Most recent labs (06/19/2022): Hgb 11.3/MCV 68.9 - PLAN: Anemia is stable and secondary to beta thalassemia minor. - Treatment of iron overload as below.  2.  Iron overload - Most recent iron panel (06/19/2022 shows ferritin 763 with iron saturation ration 69% and serum iron 210 - This is suspected to be iatrogenic, as he has been on iron supplementation for 5+ years, which was likely prescribed due  to his microcytic anemia - Fatty liver disease may also be compounding elevated ferritin - His microcytic anemia is known to be from beta thalassemia minor, there is no indication for iron supplementation - PLAN: We will STOP iron supplementation at this time.  If he continues to have elevations in iron stores, would consider checking for hemochromatosis.  3.  Thrombocytopenia - He has mild thrombocytopenia since 2009. - CT scan of abdomen December 2019 shows hepatic steatosis.  Craniocaudal spleen size is measured at 13 cm, slightly more than upper limit of normal. -Nutritional work-up and infectious etiology was negative.  H. pylori IgA level is elevated with normal IgG, nonspecific - No bleeding history including nosebleeds, hematuria reported.  No easy bruising.    - Most recent CBC shows platelets 61, slightly lower than usual baseline - Differential diagnosis for mild thrombocytopenia includes immune mediated thrombocytopenia versus splenomegaly/fatty liver disease. - PLAN: Platelets stable.  No intervention necessary. - Repeat CBC in 2 months (labs only).   - Repeat CBC with office visit in 4 months      All questions were answered. The patient knows to call the clinic with any problems, questions or concerns.  Medical decision making: Moderate  Time spent on visit: I spent 20 minutes counseling the patient face to face. The total time spent in the appointment was 30 minutes and more than 50% was on counseling.   Harriett Rush, PA-C  06/26/2022 11:04 AM

## 2022-06-26 ENCOUNTER — Inpatient Hospital Stay (HOSPITAL_BASED_OUTPATIENT_CLINIC_OR_DEPARTMENT_OTHER): Payer: Medicare Other | Admitting: Physician Assistant

## 2022-06-26 VITALS — BP 118/75 | HR 81 | Temp 97.9°F | Resp 18 | Wt 138.0 lb

## 2022-06-26 DIAGNOSIS — D509 Iron deficiency anemia, unspecified: Secondary | ICD-10-CM

## 2022-06-26 DIAGNOSIS — D563 Thalassemia minor: Secondary | ICD-10-CM

## 2022-06-26 DIAGNOSIS — D696 Thrombocytopenia, unspecified: Secondary | ICD-10-CM | POA: Diagnosis not present

## 2022-06-26 DIAGNOSIS — Z79899 Other long term (current) drug therapy: Secondary | ICD-10-CM | POA: Diagnosis not present

## 2022-06-26 NOTE — Patient Instructions (Signed)
[place] [facility] **   You were seen today by Tarri Abernethy PA-C for your anemia and low platelets.    ANEMIA: Your anemia is a condition called "beta thalassemia minor."  This means that you have a genetic mutation (inherited from your parents) that causes your blood cells to be smaller than normal and for you to have 2 few blood cells. Your anemia is NOT from iron deficiency. You have been previously prescribed iron supplement, which has caused you to have too much iron in your body. It is important that you STOP taking iron supplement. Do NOT restart any iron supplement without checking with our office first.  LOW PLATELETS: Your low platelets may be related to your fatty liver disease and mildly enlarged spleen.  You may also have some immune system dysfunction that causes your body to attack its own platelets. Your platelets are slightly lower than normal. We will continue to monitor them with repeat labs in 2 months and again in 4 months.  FOLLOW-UP APPOINTMENT: Office visit in 4 months  ** Thank you for trusting me with your healthcare!  I strive to provide all of my patients with quality care at each visit.  If you receive a survey for this visit, I would be so grateful to you for taking the time to provide feedback.  Thank you in advance!  ~ Jihad Brownlow                   Dr. Derek Jack   &   Tarri Abernethy, PA-C   - - - - - - - - - - - - - - - - - -    Thank you for choosing [facility] to provide your oncology and hematology care.  To afford each patient quality time with our provider, please arrive at least 15 minutes before your scheduled appointment time.   If you have a lab appointment with the [facility] please come in thru the Main Entrance and check in at the main information desk.  You need to re-schedule your appointment should you arrive 10 or  more minutes late.  We strive to give you quality time with our providers, and arriving late affects you and other patients whose appointments are after yours.  Also, if you no show three or more times for appointments you may be dismissed from the clinic at the providers discretion.     Again, thank you for choosing [facility].  Our hope is that these requests will decrease the amount of time that you wait before being seen by our physicians.       _____________________________________________________________  Should you have questions after your visit to [facility], please contact our office at [phone] and follow the prompts.  Our office hours are 8:00 a.m. and 4:30 p.m. Monday - Friday.  Please note that voicemails left after 4:00 p.m. may not be returned until the following business day.  We are closed weekends and major holidays.  You do have access to a nurse 24-7, just call the main number to the clinic [phone] and do not press any options, hold on the line and a nurse will answer the phone.    For prescription refill requests, have your pharmacy contact our office and allow 72 hours.

## 2022-07-04 ENCOUNTER — Other Ambulatory Visit: Payer: Self-pay | Admitting: Gastroenterology

## 2022-07-04 DIAGNOSIS — K219 Gastro-esophageal reflux disease without esophagitis: Secondary | ICD-10-CM

## 2022-07-04 DIAGNOSIS — R1013 Epigastric pain: Secondary | ICD-10-CM

## 2022-08-28 ENCOUNTER — Inpatient Hospital Stay: Payer: Medicare Other | Attending: Hematology

## 2022-08-28 DIAGNOSIS — D696 Thrombocytopenia, unspecified: Secondary | ICD-10-CM

## 2022-08-28 DIAGNOSIS — D509 Iron deficiency anemia, unspecified: Secondary | ICD-10-CM | POA: Insufficient documentation

## 2022-08-28 DIAGNOSIS — D563 Thalassemia minor: Secondary | ICD-10-CM

## 2022-08-28 LAB — CBC WITH DIFFERENTIAL/PLATELET
Abs Immature Granulocytes: 0.01 10*3/uL (ref 0.00–0.07)
Basophils Absolute: 0 10*3/uL (ref 0.0–0.1)
Basophils Relative: 0 %
Eosinophils Absolute: 0.1 10*3/uL (ref 0.0–0.5)
Eosinophils Relative: 1 %
HCT: 41.1 % (ref 39.0–52.0)
Hemoglobin: 12.1 g/dL — ABNORMAL LOW (ref 13.0–17.0)
Immature Granulocytes: 0 %
Lymphocytes Relative: 49 %
Lymphs Abs: 2.6 10*3/uL (ref 0.7–4.0)
MCH: 20.6 pg — ABNORMAL LOW (ref 26.0–34.0)
MCHC: 29.4 g/dL — ABNORMAL LOW (ref 30.0–36.0)
MCV: 70 fL — ABNORMAL LOW (ref 80.0–100.0)
Monocytes Absolute: 0.3 10*3/uL (ref 0.1–1.0)
Monocytes Relative: 5 %
Neutro Abs: 2.5 10*3/uL (ref 1.7–7.7)
Neutrophils Relative %: 45 %
Platelets: 59 10*3/uL — ABNORMAL LOW (ref 150–400)
RBC: 5.87 MIL/uL — ABNORMAL HIGH (ref 4.22–5.81)
RDW: 19.9 % — ABNORMAL HIGH (ref 11.5–15.5)
WBC: 5.4 10*3/uL (ref 4.0–10.5)
nRBC: 0.4 % — ABNORMAL HIGH (ref 0.0–0.2)

## 2022-09-05 DIAGNOSIS — I10 Essential (primary) hypertension: Secondary | ICD-10-CM | POA: Diagnosis not present

## 2022-09-05 DIAGNOSIS — K219 Gastro-esophageal reflux disease without esophagitis: Secondary | ICD-10-CM | POA: Diagnosis not present

## 2022-09-11 ENCOUNTER — Other Ambulatory Visit: Payer: Self-pay | Admitting: Physician Assistant

## 2022-09-11 DIAGNOSIS — D696 Thrombocytopenia, unspecified: Secondary | ICD-10-CM

## 2022-09-11 NOTE — Progress Notes (Addendum)
Most recent CBC (08/28/2022) shows persistently worsening thrombocytopenia with platelets 59.  Since this is somewhat lower than his usual baseline, we will check some additional testing.  Previous work-up (September 2020) has shown negative hepatitis B and hepatitis C.  H. pylori IgA level elevated, with normal IgG (nonspecific).  He had negative ANA and rheumatoid factor.  We will check additional work-up prior to his visit in January 2024, to include the following: - Abdominal ultrasound to check for underlying liver disease or splenomegaly (CT imaging in 2019 showed fatty liver disease and mild splenomegaly, would consider that he is at risk of progressive liver disease) - Labs to include SPEP, free light chains, immunofixation - Repeat nutritional panel (B12, MMA, folate, homocystine, copper) and immature platelet fraction.

## 2022-09-11 NOTE — Addendum Note (Signed)
Addended by: Tarri Abernethy on: 09/11/2022 02:01 PM   Modules accepted: Orders

## 2022-09-19 ENCOUNTER — Other Ambulatory Visit: Payer: Self-pay | Admitting: Internal Medicine

## 2022-09-19 DIAGNOSIS — K219 Gastro-esophageal reflux disease without esophagitis: Secondary | ICD-10-CM

## 2022-09-19 DIAGNOSIS — R1013 Epigastric pain: Secondary | ICD-10-CM

## 2022-11-01 ENCOUNTER — Other Ambulatory Visit: Payer: Self-pay

## 2022-11-01 DIAGNOSIS — D696 Thrombocytopenia, unspecified: Secondary | ICD-10-CM

## 2022-11-02 ENCOUNTER — Inpatient Hospital Stay: Payer: Medicare Other | Attending: Hematology

## 2022-11-02 ENCOUNTER — Ambulatory Visit (HOSPITAL_COMMUNITY): Admission: RE | Admit: 2022-11-02 | Payer: Medicare Other | Source: Ambulatory Visit

## 2022-11-02 DIAGNOSIS — D563 Thalassemia minor: Secondary | ICD-10-CM

## 2022-11-02 DIAGNOSIS — D696 Thrombocytopenia, unspecified: Secondary | ICD-10-CM

## 2022-11-02 DIAGNOSIS — D509 Iron deficiency anemia, unspecified: Secondary | ICD-10-CM | POA: Diagnosis not present

## 2022-11-02 DIAGNOSIS — Z79899 Other long term (current) drug therapy: Secondary | ICD-10-CM | POA: Diagnosis not present

## 2022-11-02 LAB — CBC WITH DIFFERENTIAL/PLATELET
Band Neutrophils: 1 %
Basophils Absolute: 0 10*3/uL (ref 0.0–0.1)
Basophils Relative: 0 %
Eosinophils Absolute: 0.1 10*3/uL (ref 0.0–0.5)
Eosinophils Relative: 1 %
HCT: 41.3 % (ref 39.0–52.0)
Hemoglobin: 12.2 g/dL — ABNORMAL LOW (ref 13.0–17.0)
Lymphocytes Relative: 61 %
Lymphs Abs: 3.2 10*3/uL (ref 0.7–4.0)
MCH: 20.4 pg — ABNORMAL LOW (ref 26.0–34.0)
MCHC: 29.5 g/dL — ABNORMAL LOW (ref 30.0–36.0)
MCV: 69.1 fL — ABNORMAL LOW (ref 80.0–100.0)
Monocytes Absolute: 0.2 10*3/uL (ref 0.1–1.0)
Monocytes Relative: 3 %
Neutro Abs: 1.9 10*3/uL (ref 1.7–7.7)
Neutrophils Relative %: 34 %
Platelets: 111 10*3/uL — ABNORMAL LOW (ref 150–400)
RBC: 5.98 MIL/uL — ABNORMAL HIGH (ref 4.22–5.81)
RDW: 19.4 % — ABNORMAL HIGH (ref 11.5–15.5)
WBC: 5.3 10*3/uL (ref 4.0–10.5)
nRBC: 0.4 % — ABNORMAL HIGH (ref 0.0–0.2)

## 2022-11-02 LAB — IRON AND TIBC
Iron: 212 ug/dL — ABNORMAL HIGH (ref 45–182)
Saturation Ratios: 67 % — ABNORMAL HIGH (ref 17.9–39.5)
TIBC: 316 ug/dL (ref 250–450)
UIBC: 104 ug/dL

## 2022-11-02 LAB — VITAMIN B12: Vitamin B-12: 276 pg/mL (ref 180–914)

## 2022-11-02 LAB — FERRITIN: Ferritin: 841 ng/mL — ABNORMAL HIGH (ref 24–336)

## 2022-11-02 LAB — FOLATE: Folate: 19.9 ng/mL (ref >5.9)

## 2022-11-02 LAB — IMMATURE PLATELET FRACTION: Immature Platelet Fraction: 15.9 % — ABNORMAL HIGH (ref 1.2–8.6)

## 2022-11-03 LAB — KAPPA/LAMBDA LIGHT CHAINS
Kappa free light chain: 32.6 mg/L — ABNORMAL HIGH (ref 3.3–19.4)
Kappa, lambda light chain ratio: 1.17 (ref 0.26–1.65)
Lambda free light chains: 27.8 mg/L — ABNORMAL HIGH (ref 5.7–26.3)

## 2022-11-03 LAB — HOMOCYSTEINE: Homocysteine: 9.4 umol/L (ref 0.0–14.5)

## 2022-11-06 LAB — METHYLMALONIC ACID, SERUM: Methylmalonic Acid, Quantitative: 233 nmol/L (ref 0–378)

## 2022-11-07 LAB — PROTEIN ELECTROPHORESIS, SERUM
A/G Ratio: 1.3 (ref 0.7–1.7)
Albumin ELP: 3.9 g/dL (ref 2.9–4.4)
Alpha-1-Globulin: 0.2 g/dL (ref 0.0–0.4)
Alpha-2-Globulin: 0.5 g/dL (ref 0.4–1.0)
Beta Globulin: 0.8 g/dL (ref 0.7–1.3)
Gamma Globulin: 1.4 g/dL (ref 0.4–1.8)
Globulin, Total: 3 g/dL (ref 2.2–3.9)
Total Protein ELP: 6.9 g/dL (ref 6.0–8.5)

## 2022-11-07 LAB — IMMUNOFIXATION ELECTROPHORESIS
IgA: 223 mg/dL (ref 90–386)
IgG (Immunoglobin G), Serum: 1471 mg/dL (ref 603–1613)
IgM (Immunoglobulin M), Srm: 115 mg/dL (ref 20–172)
Total Protein ELP: 7 g/dL (ref 6.0–8.5)

## 2022-11-08 LAB — COPPER, SERUM: Copper: 109 ug/dL (ref 69–132)

## 2022-11-08 NOTE — Progress Notes (Unsigned)
VIRTUAL VISIT via [facility] [place] connected with Cole Stewart  on 11/09/22 at 11:25 AM by video and verified that I am speaking with the correct person using two identifiers.  Location: Patient: Cole Stewart Provider: Home Office   I discussed the limitations, risks, security and privacy concerns of performing an evaluation and management service by virtual/video visit and the availability of in person appointments. I also discussed with the patient that there may be a patient responsible charge related to this service. The patient expressed understanding and agreed to proceed.  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  He was last seen by Tarri Abernethy PA-C on 06/26/2022.  He presents today with his caregiver.   Due to underlying cognitive deficit, patient is poor historian.  Caregiver/group home staff member is not aware of any changes in patient's health status, but is not fully aware of the details of patient's condition either.  No abnormal bleeding, bruising, or petechial rash.  No fever, chills, night sweats, unintentional weight loss.  At previous visit, instructions were given for patient to STOP taking his iron supplement.  Patient has not been taking iron supplement since his last visit in August 2023.  He has 100% energy and 100% appetite. He endorses that he is maintaining a stable weight.   REVIEW OF SYSTEMS:  Review of Systems  Constitutional:  Positive for fatigue. Negative for appetite change, chills, diaphoresis, fever and unexpected weight change.  HENT:   Negative for lump/mass and nosebleeds.   Eyes:   Negative for eye problems.  Respiratory:  Negative for cough, hemoptysis and shortness of breath.   Cardiovascular:  Negative for chest pain, leg swelling and palpitations.  Gastrointestinal:  Positive for abdominal pain. Negative for blood in stool, constipation, diarrhea, nausea and vomiting.  Genitourinary:  Negative for hematuria.   Skin: Negative.   Neurological:  Negative for dizziness, headaches and light-headedness.  Hematological:  Does not bruise/bleed easily.      PAST MEDICAL/SURGICAL HISTORY:  Past Medical History:  Diagnosis Date   Anemia    GERD (gastroesophageal reflux disease)    Mental retardation    Mood disorder ([place])    MVP (mitral valve prolapse)    Recurrent abdominal pain    Seizures ([place])    last seizure was 2010.Unknown etiology   Thrombocytopenia ([place])    Past Surgical History:  Procedure Laterality Date   CHOLECYSTECTOMY     COLONOSCOPY WITH PROPOFOL N/A 12/05/2018   PROPOFOL;  Surgeon: Daneil Dolin, MD;  One 5 mm polyp in the cecum.  Tubular adenoma.  Due for repeat in 2025.   ESOPHAGOGASTRODUODENOSCOPY (EGD) WITH PROPOFOL N/A 07/10/2019   PROPOFOL;  Surgeon: Daneil Dolin, MD; normal exam   POLYPECTOMY  12/05/2018   Procedure: POLYPECTOMY;  Surgeon: Daneil Dolin, MD;  Location: [facility];  Service: Endoscopy;;  colon     SOCIAL HISTORY:  Social History   Socioeconomic History   Marital status: Single    Spouse name: Not on file   Number of children: 0   Years of education: Not on file   Highest education level: Not on file  Occupational History  Not on file  Tobacco Use   Smoking status: Never   Smokeless tobacco: Never  Vaping Use   Vaping Use: Never used  Substance and Sexual Activity   Alcohol use: No   Drug use: No   Sexual activity: Never  Other Topics Concern   Not on file  Social History Narrative   Not on file   Social Determinants of Health   Financial Resource Strain: Not on file  Food Insecurity: Not on  file  Transportation Needs: Not on file  Physical Activity: Not on file  Stress: Not on file  Social Connections: Not on file  Intimate Partner Violence: Not on file    FAMILY HISTORY:  Family History  Family history unknown: Yes    CURRENT MEDICATIONS:  Outpatient Encounter Medications as of 11/09/2022  Medication Sig   ACETAMINOPHEN EXTRA STRENGTH 500 MG tablet Take by mouth.   ANTACID 200-200-20 MG/5ML suspension Take by mouth.   BANOPHEN 25 MG capsule Take 25 mg by mouth at bedtime.   chlorhexidine (PERIDEX) 0.12 % solution Use as directed 15 mLs in the mouth or throat 2 (two) times daily.    clonazePAM (KLONOPIN) 0.5 MG tablet Take 0.25-0.5 mg by mouth See admin instructions. Take 0.25 mg by mouth in the morning and take 0.5 mg at noon and at bedtime   dexlansoprazole (DEXILANT) 60 MG capsule TAKE 1 CAPSULE BY MOUTH ONCE A DAY.   dicyclomine (BENTYL) 10 MG capsule TAKE 1 CAPSULE UP TO 3 TIMES DAILY DAILY AS NEEDED FOR ABDOMINAL PAIN OR DIARRHEA. HOLD IN THE SETTING OF CONSTIPATION.   divalproex (DEPAKOTE) 250 MG DR tablet Take 250-500 mg by mouth See admin instructions. Take 250 mg by mouth at 0700 and take 250 mg by mouth at 1200. Take 500 mg by mouth at bedtime 2000.   docusate sodium (COLACE) 100 MG capsule Take 100 mg by mouth 2 (two) times daily.   doxepin (SINEQUAN) 10 MG capsule Take 10 mg by mouth at bedtime.   famotidine (PEPCID) 20 MG tablet TAKE 1 TABLET BY MOUTH AT BEDTIME.   gabapentin (NEURONTIN) 400 MG capsule Take 400-1,200 mg by mouth See admin instructions. Take 400 mg by mouth in the morning and take 1200 mg by mouth at bedtime   gabapentin (NEURONTIN) 600 MG tablet    hydrocortisone cream 1 % Apply topically.   Iloperidone 6 MG TABS Take 6 mg by mouth daily.    ketoconazole (NIZORAL) 2 % shampoo Apply 1 application topically 2 (two) times a week. *Lather, leave for 5 minutes, then rinse*   MILK OF MAGNESIA 1200 MG/15ML suspension SMARTSIG:Milliliter(s) By Mouth    Neomycin-Bacitracin-Polymyxin (TRIPLE ANTIBIOTIC) 3.5-9317724083 OINT Apply topically.   Olopatadine HCl 0.2 % SOLN Place 1 drop into both eyes daily.    Omega-3 Fatty Acids (FISH OIL OMEGA-3) 1000 MG CAPS Take 2,000 mg by mouth daily.   ondansetron (ZOFRAN) 4 MG tablet Take 4 mg by mouth at bedtime.   polyethylene glycol powder (MIRALAX) 17 GM/SCOOP powder Take 17 g by mouth daily as needed for mild constipation.   promethazine (PHENERGAN) 25 MG tablet Take by mouth.   QUEtiapine (SEROQUEL XR) 400 MG 24 hr tablet Take 400 mg by mouth at bedtime.   TUSSIN DM 100-10 MG/5ML liquid Take by mouth.   Vitamin D, Ergocalciferol, (DRISDOL) 1.25 MG (50000 UT) CAPS capsule Take 50,000 Units by mouth every 30 (thirty) days.   vitamin E 200 UNIT capsule Take 200 Units by mouth every morning.  zolpidem (AMBIEN) 5 MG tablet Take 5 mg by mouth at bedtime.    No facility-administered encounter medications on file as of 11/09/2022.    ALLERGIES:  No Known Allergies   PHYSICAL EXAM: (per limitations of video visit): The patient is alert and mental status appears to be at baseline.  NOTE: Since patient was present in clinic (provider off-site), patient's vital signs and weight were obtained by nursing staff at the time of this visit.    06/26/2022   10:42 AM 12/20/2021    2:11 PM 05/31/2021    1:53 PM  Vitals with BMI  Weight 138 lbs 157 lbs 7 oz   Systolic 811 914 782  Diastolic 75 86 76  Pulse 81 95 82    LABORATORY DATA:  I have reviewed the labs as listed.  CBC    Component Value Date/Time   WBC 5.3 11/02/2022 0840   RBC 5.98 (H) 11/02/2022 0840   HGB 12.2 (L) 11/02/2022 0840   HCT 41.3 11/02/2022 0840   PLT 111 (L) 11/02/2022 0840   MCV 69.1 (L) 11/02/2022 0840   MCH 20.4 (L) 11/02/2022 0840   MCHC 29.5 (L) 11/02/2022 0840   RDW 19.4 (H) 11/02/2022 0840   LYMPHSABS 3.2 11/02/2022 0840   MONOABS 0.2 11/02/2022 0840   EOSABS 0.1 11/02/2022 0840   BASOSABS 0.0 11/02/2022 0840       Latest Ref Rng & Units 05/24/2021    2:04 PM 10/18/2020    9:03 AM 04/15/2020   11:10 AM  CMP  Glucose 70 - 99 mg/dL 84  114  105   BUN 6 - 20 mg/dL '16  12  12   '$ Creatinine 0.61 - 1.24 mg/dL 0.68  0.60  0.65   Sodium 135 - 145 mmol/L 134  137  138   Potassium 3.5 - 5.1 mmol/L 3.8  3.7  4.2   Chloride 98 - 111 mmol/L 100  103  103   CO2 22 - 32 mmol/L '25  24  27   '$ Calcium 8.9 - 10.3 mg/dL 9.3  8.9  9.2   Total Protein 6.5 - 8.1 g/dL 7.3  7.1  7.5   Total Bilirubin 0.3 - 1.2 mg/dL 1.1  1.0  1.2   Alkaline Phos 38 - 126 U/L 67  67  68   AST 15 - 41 U/L '29  19  22   '$ ALT 0 - 44 U/L 39  26  27     DIAGNOSTIC IMAGING:  I have independently reviewed the relevant imaging and discussed with the patient.   ASSESSMENT & PLAN: 1.  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 - No history of bright red blood per rectum or melena. - Colonoscopy on 12/05/2018 shows one 5 mm polyp in the cecum.  Otherwise normal exam.  Pathology showed tubular adenoma. - EGD on 07/10/2019 shows normal esophagus, stomach, normal duodenal bulb and second part of duodenum. - Hemoglobin electrophoresis consistent with beta thalassemia minor - Most recent labs (11/02/2022): Hgb 12.2/MCV 69.1 - PLAN: Anemia is stable and secondary to beta thalassemia minor. - Treatment of iron overload as below.  2.  Iron overload - Previous iron panel (06/19/2022) shows ferritin 763 with iron saturation ration  69% and serum iron 210 - This is suspected to be iatrogenic, as he has been on iron supplementation for 5+ years, which was likely prescribed due to his microcytic anemia - Fatty liver disease may also be compounding  elevated ferritin - His microcytic anemia is known to be from beta thalassemia minor, there is no indication for iron supplementation.  Iron supplement was stopped in August 2023. - Most recent labs (11/02/2022): Ferritin 841, iron saturation 67% with serum iron 212 - PLAN: Patient has persistent iron overload.  We will check hemochromatosis panel.  3.  Thrombocytopenia - He has mild to moderate thrombocytopenia since 2009. - CT scan of abdomen December 2019 showed hepatic steatosis.  Craniocaudal spleen size is measured at 13 cm, slightly more than upper limit of normal. - Previous work-up (September 2020) has shown negative hepatitis B and hepatitis C.  H. pylori IgA level elevated, with normal IgG (nonspecific).  He had negative ANA and rheumatoid factor. - CBC from 06/09/2022 showed platelets lower than normal at 61.  Repeat CBC (08/28/2022) showed persistently low platelets 59. - Additional workup of worsening thrombocytopenia (11/02/2022):  SPEP negative.  Immunofixation normal.  Mildly elevated kappa 32.6, mildly elevated lambda 27.8, normal free light chain ratio 1.17. Immature platelet fraction is elevated at 15.9% (consistent with peripheral platelet destruction or sequestration) Nutritional panel: Marginal B12 276, normal MMA.  Normal folate and homocystine.  Normal copper.  - No bleeding history including nosebleeds, hematuria reported.  No easy bruising. - DIFFERENTIAL DIAGNOSIS favors immune mediated thrombocytopenia versus splenomegaly/fatty liver disease. - PLAN: Recommend to START vitamin B12 500 mcg daily. - Abdominal ultrasound was ordered but not yet obtained.  - Repeat CBC, B12, MMA with office visit in 3 months     PLAN SUMMARY: >> Abdominal ultrasound >> Labs in 3 months (CBC/D, CMP, ferritin, iron/TIBC, hemochromatosis DNA, B12, MMA) >> OFFICE visit 2 weeks after labs   I discussed the assessment and treatment plan with the patient. The patient was provided an  opportunity to ask questions and all were answered. The patient agreed with the plan and demonstrated an understanding of the instructions.   The patient was advised to call or seek an in-person evaluation if the symptoms worsen or if the condition fails to improve as anticipated.  Medical decision making: Moderate  Time spent on visit: I spent 23 minutes in preparation for visit and in counseling patient via virtual video visit.  Harriett Rush, PA-C 11/09/22 12:49 PM

## 2022-11-09 ENCOUNTER — Encounter: Payer: Self-pay | Admitting: Physician Assistant

## 2022-11-09 ENCOUNTER — Inpatient Hospital Stay: Payer: 59 | Attending: Hematology | Admitting: Physician Assistant

## 2022-11-09 ENCOUNTER — Inpatient Hospital Stay: Payer: 59

## 2022-11-09 ENCOUNTER — Other Ambulatory Visit: Payer: Self-pay

## 2022-11-09 VITALS — BP 131/82 | HR 84 | Temp 98.1°F | Resp 18 | Wt 159.0 lb

## 2022-11-09 DIAGNOSIS — E538 Deficiency of other specified B group vitamins: Secondary | ICD-10-CM

## 2022-11-09 DIAGNOSIS — D563 Thalassemia minor: Secondary | ICD-10-CM

## 2022-11-09 DIAGNOSIS — D696 Thrombocytopenia, unspecified: Secondary | ICD-10-CM

## 2022-11-09 MED ORDER — CYANOCOBALAMIN 500 MCG PO TABS: 500.0000 ug | ORAL_TABLET | Freq: Every day | ORAL | 3 refills | Status: DC

## 2022-11-09 NOTE — Patient Instructions (Signed)
[facility] [place] **   You were seen today by Tarri Abernethy PA-C for your anemia and low platelets.    ANEMIA: Your anemia is a condition called "beta thalassemia minor."  This means that you have a genetic mutation (inherited from your parents) that causes your blood cells to be smaller than normal and for you to have 2 few blood cells. Your anemia is NOT from iron deficiency. You have been previously prescribed iron supplement, which has caused you to have too much iron in your body. It is important that you STOP taking iron supplement. Do NOT restart any iron supplement without checking with our office first.  LOW PLATELETS: Your low platelets may be related to your fatty liver disease and mildly enlarged spleen.  You may also have some immune system dysfunction that causes your body to attack its own platelets. Your platelets are slightly lower than normal. We need to check an ABDOMINAL ULTRASOUND to check on your liver and spleen.  LOW VITAMIN B-12: Your vitamin B12 levels are slightly low. You should START taking over-the-counter vitamin B12 supplement 500 mcg daily.   FOLLOW-UP APPOINTMENT: Labs in 3 months.  Office visit 2 weeks after labs.  ** Thank you for trusting me with your healthcare!  I strive to provide all of my patients with quality care at each visit.  If you receive a survey for this visit, I would be so grateful to you for taking the time to provide feedback.  Thank you in advance!  ~ Vito Beg                   Dr. Derek Jack   &   Tarri Abernethy, PA-C   - - - - - - - - - - - - - - - - - -    Thank you for choosing [facility] at [facility] to provide your oncology and hematology care.  To afford each patient quality time with our provider, please arrive at least 15 minutes before your scheduled appointment time.   If you have a lab appointment with the  [facility] please come in thru the Main Entrance and check in at the main information desk.  You need to re-schedule your appointment should you arrive 10 or more minutes late.  We strive to give you quality time with our providers, and arriving late affects you and other patients whose appointments are after yours.  Also, if you no show three or more times for appointments you may be dismissed from the clinic at the providers discretion.     Again, thank you for choosing [facility].  Our hope is that these requests will decrease the amount of time that you wait before being seen by our physicians.       _____________________________________________________________  Should you have questions after your visit to [facility], please contact our office at [phone] and follow the prompts.  Our office hours are 8:00 a.m. and 4:30 p.m. Monday - Friday.  Please note that voicemails left after 4:00 p.m. may not be returned until the following business day.  We are closed weekends and major holidays.  You do have access to a nurse 24-7, just call the main number to the clinic [phone] and do not press any options, hold on the line and a nurse will answer the phone.    For prescription refill  requests, have your pharmacy contact our office and allow 72 hours.

## 2022-11-24 ENCOUNTER — Ambulatory Visit (HOSPITAL_COMMUNITY): Admission: RE | Admit: 2022-11-24 | Payer: 59 | Source: Ambulatory Visit

## 2022-12-11 DIAGNOSIS — R569 Unspecified convulsions: Secondary | ICD-10-CM | POA: Diagnosis not present

## 2022-12-11 DIAGNOSIS — Z299 Encounter for prophylactic measures, unspecified: Secondary | ICD-10-CM | POA: Diagnosis not present

## 2022-12-11 DIAGNOSIS — I10 Essential (primary) hypertension: Secondary | ICD-10-CM | POA: Diagnosis not present

## 2022-12-11 DIAGNOSIS — D696 Thrombocytopenia, unspecified: Secondary | ICD-10-CM | POA: Diagnosis not present

## 2023-01-09 DIAGNOSIS — I10 Essential (primary) hypertension: Secondary | ICD-10-CM | POA: Diagnosis not present

## 2023-01-09 DIAGNOSIS — Z299 Encounter for prophylactic measures, unspecified: Secondary | ICD-10-CM | POA: Diagnosis not present

## 2023-01-30 ENCOUNTER — Inpatient Hospital Stay: Payer: 59 | Attending: Hematology

## 2023-01-30 DIAGNOSIS — D509 Iron deficiency anemia, unspecified: Secondary | ICD-10-CM | POA: Diagnosis not present

## 2023-01-30 DIAGNOSIS — D696 Thrombocytopenia, unspecified: Secondary | ICD-10-CM

## 2023-01-30 DIAGNOSIS — D563 Thalassemia minor: Secondary | ICD-10-CM

## 2023-01-30 DIAGNOSIS — M25512 Pain in left shoulder: Secondary | ICD-10-CM | POA: Diagnosis not present

## 2023-01-30 DIAGNOSIS — Z87891 Personal history of nicotine dependence: Secondary | ICD-10-CM | POA: Diagnosis not present

## 2023-01-30 DIAGNOSIS — E538 Deficiency of other specified B group vitamins: Secondary | ICD-10-CM

## 2023-01-30 LAB — COMPREHENSIVE METABOLIC PANEL
ALT: 29 U/L (ref 0–44)
AST: 24 U/L (ref 15–41)
Albumin: 3.9 g/dL (ref 3.5–5.0)
Alkaline Phosphatase: 55 U/L (ref 38–126)
Anion gap: 9 (ref 5–15)
BUN: 14 mg/dL (ref 6–20)
CO2: 25 mmol/L (ref 22–32)
Calcium: 8.8 mg/dL — ABNORMAL LOW (ref 8.9–10.3)
Chloride: 102 mmol/L (ref 98–111)
Creatinine, Ser: 0.58 mg/dL — ABNORMAL LOW (ref 0.61–1.24)
GFR, Estimated: >60 mL/min (ref >60)
Glucose, Bld: 99 mg/dL (ref 70–99)
Potassium: 3.8 mmol/L (ref 3.5–5.1)
Sodium: 136 mmol/L (ref 135–145)
Total Bilirubin: 1 mg/dL (ref 0.3–1.2)
Total Protein: 6.9 g/dL (ref 6.5–8.1)

## 2023-01-30 LAB — CBC WITH DIFFERENTIAL/PLATELET
Abs Immature Granulocytes: 0 10*3/uL (ref 0.00–0.07)
Basophils Absolute: 0 10*3/uL (ref 0.0–0.1)
Basophils Relative: 0 %
Eosinophils Absolute: 0 10*3/uL (ref 0.0–0.5)
Eosinophils Relative: 0 %
HCT: 36.4 % — ABNORMAL LOW (ref 39.0–52.0)
Hemoglobin: 10.7 g/dL — ABNORMAL LOW (ref 13.0–17.0)
Lymphocytes Relative: 50 %
Lymphs Abs: 3.1 10*3/uL (ref 0.7–4.0)
MCH: 20.5 pg — ABNORMAL LOW (ref 26.0–34.0)
MCHC: 29.4 g/dL — ABNORMAL LOW (ref 30.0–36.0)
MCV: 69.9 fL — ABNORMAL LOW (ref 80.0–100.0)
Monocytes Absolute: 0.4 10*3/uL (ref 0.1–1.0)
Monocytes Relative: 7 %
Neutro Abs: 2.7 10*3/uL (ref 1.7–7.7)
Neutrophils Relative %: 43 %
Platelets: DECREASED 10*3/uL (ref 150–400)
RBC: 5.21 MIL/uL (ref 4.22–5.81)
RDW: 18.6 % — ABNORMAL HIGH (ref 11.5–15.5)
Smear Review: DECREASED
WBC Morphology: REACTIVE
WBC: 6.2 10*3/uL (ref 4.0–10.5)
nRBC: 0 % (ref 0.0–0.2)
nRBC: 1 /100 WBC — ABNORMAL HIGH

## 2023-01-30 LAB — IRON AND TIBC
Iron: 158 ug/dL (ref 45–182)
Saturation Ratios: 51 % — ABNORMAL HIGH (ref 17.9–39.5)
TIBC: 310 ug/dL (ref 250–450)
UIBC: 152 ug/dL

## 2023-01-30 LAB — VITAMIN B12: Vitamin B-12: 330 pg/mL (ref 180–914)

## 2023-01-30 LAB — FERRITIN: Ferritin: 798 ng/mL — ABNORMAL HIGH (ref 24–336)

## 2023-02-05 LAB — METHYLMALONIC ACID, SERUM: Methylmalonic Acid, Quantitative: 142 nmol/L (ref 0–378)

## 2023-02-08 LAB — HEMOCHROMATOSIS DNA-PCR(C282Y,H63D)

## 2023-02-12 ENCOUNTER — Other Ambulatory Visit: Payer: Self-pay | Admitting: *Deleted

## 2023-02-12 DIAGNOSIS — E538 Deficiency of other specified B group vitamins: Secondary | ICD-10-CM

## 2023-02-12 MED ORDER — CYANOCOBALAMIN 500 MCG PO TABS: 500.0000 ug | ORAL_TABLET | Freq: Every day | ORAL | 3 refills | Status: DC

## 2023-02-12 NOTE — Progress Notes (Unsigned)
[facility] [address] [place], [place] [address]   CLINIC:  Medical Oncology/Hematology  PCP:  Cole Peri, MD [address] 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  He was last seen by Rojelio Brenner PA-C on 11/09/2022.  He presents today with his caregiver.  *** Due to underlying cognitive deficit, patient is poor historian.  *** Caregiver/group home staff member is not aware of any changes in patient's health status, but admits that he/she *** is not fully aware of details of patient's condition either.  ***No abnormal bleeding, bruising, or petechial rash.  No fever, chills, night sweats, unintentional weight loss. *** Patient has not been taking iron supplements since it was discontinued at his visit in August 2023. *** Vitamin B12 500 mcg daily  He has ***% energy and ***% appetite. He endorses that he is maintaining a stable weight.   ASSESSMENT & PLAN:  1.  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 - No history of bright red blood per rectum or melena. - Colonoscopy on 12/05/2018 shows one 5 mm polyp in the cecum.  Otherwise normal exam.  Pathology showed tubular adenoma. - EGD on 07/10/2019 shows normal esophagus, stomach, normal duodenal bulb and second part of duodenum. - Hemoglobin electrophoresis consistent with beta thalassemia minor - Most  recent labs (01/30/2023): Hgb 10.7/MCV 69.9 - PLAN: Anemia is stable and secondary to beta thalassemia minor. - Treatment of iron overload as below.  2.  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  Iron supplement was stopped in August 2023. - Labs (11/02/2022): Ferritin 841, iron saturation 67% with serum iron 212 - Most recent labs (01/30/2023): Ferritin 798, iron saturation 51% - PLAN: We will continue monitoring.  If ferritin >1000, will check MRI liver to see if patient would benefit from iron reduction therapy.  ***   3.  Thrombocytopenia with vitamin B12 deficiency - He has mild to moderate thrombocytopenia since 2009. - CT scan of abdomen December 2019 showed hepatic steatosis.  Craniocaudal spleen size is measured at 13 cm, slightly more than upper limit of normal. - Previous work-up (September 2020) has shown negative hepatitis B and hepatitis C.  H. pylori IgA level elevated, with normal IgG (nonspecific).  He had negative ANA and rheumatoid factor. - CBC from 06/09/2022 showed platelets lower than normal at 61.  Repeat CBC (08/28/2022) showed persistently low platelets 59. - Additional workup of worsening thrombocytopenia (11/02/2022):  SPEP negative.  Immunofixation normal.  Mildly elevated kappa 32.6, mildly elevated lambda 27.8,  normal free light chain ratio 1.17. Immature platelet fraction is elevated at 15.9% (consistent with peripheral platelet destruction or sequestration) Nutritional panel: Marginal B12 276, normal MMA.   Normal folate and homocystine.  Normal copper.  - Patient is taking vitamin B12 5 mcg daily *** - Most recent labs (01/30/2023): Platelet clumping, but count appears decreased per technologist review.  Platelets were 111 on 11/02/2022.  B12 improved to 330, with MMA 142. - *** No bleeding history including nosebleeds, hematuria reported.  No easy bruising. - DIFFERENTIAL DIAGNOSIS favors immune mediated thrombocytopenia versus splenomegaly/fatty liver disease. - PLAN: Increase vitamin B12 to 1000 mcg daily. - Abdominal ultrasound was ordered but not yet obtained.***  - Repeat CBC, B12, MMA with office visit in 3 months     PLAN SUMMARY: >> *** >> *** >> ***   [facility] **VISIT SUMMARY & IMPORTANT INSTRUCTIONS **   You were seen today by Rojelio Brenner PA-C for your ***.    *** ***  *** ***  LABS: Return in ***   OTHER TESTS: ***  MEDICATIONS: ***  FOLLOW-UP APPOINTMENT: ***     REVIEW OF SYSTEMS: ***  Review of Systems - Oncology   PHYSICAL EXAM:  ECOG PERFORMANCE STATUS: {CHL ONC ECOG WU:[id number]} *** There were no vitals filed for this visit. There were no vitals filed for this visit. Physical Exam  PAST MEDICAL/SURGICAL HISTORY:  Past Medical History:  Diagnosis Date   Anemia    GERD (gastroesophageal reflux disease)    Mental retardation    Mood disorder (HCC)    MVP (mitral valve prolapse)    Recurrent abdominal pain    Seizures (HCC)    last seizure was 2010.Unknown etiology   Thrombocytopenia (HCC)    Past Surgical History:  Procedure Laterality Date   CHOLECYSTECTOMY     COLONOSCOPY WITH PROPOFOL N/A 12/05/2018   PROPOFOL;  Surgeon: Corbin Ade, MD;  One 5 mm polyp in the cecum.  Tubular adenoma.  Due for repeat in 2025.   ESOPHAGOGASTRODUODENOSCOPY (EGD) WITH PROPOFOL N/A 07/10/2019   PROPOFOL;  Surgeon: Corbin Ade, MD; normal exam   POLYPECTOMY  12/05/2018   Procedure: POLYPECTOMY;  Surgeon:  Corbin Ade, MD;  Location: [facility];  Service: Endoscopy;;  colon    SOCIAL HISTORY:  Social History   Socioeconomic History   Marital status: Single    Spouse name: Not on file   Number of children: 0   Years of education: Not on file   Highest education level: Not on file  Occupational History   Not on file  Tobacco Use   Smoking status: Never   Smokeless tobacco: Never  Vaping Use   Vaping Use: Never used  Substance and Sexual Activity   Alcohol use: No   Drug use: No   Sexual activity: Never  Other Topics Concern   Not on file  Social History Narrative   Not on file   Social Determinants of Health   Financial Resource Strain: Not on file  Food Insecurity: Not on file  Transportation Needs: Not on file  Physical Activity: Not on file  Stress: Not on file  Social Connections: Not on file  Intimate Partner Violence: Not on file    FAMILY HISTORY:  Family History  Family history unknown: Yes    CURRENT MEDICATIONS:  Outpatient Encounter Medications as of 02/13/2023  Medication Sig   ACETAMINOPHEN EXTRA STRENGTH 500 MG tablet Take by mouth.  ANTACID 200-200-20 MG/5ML suspension Take by mouth.   BANOPHEN 25 MG capsule Take 25 mg by mouth at bedtime.   chlorhexidine (PERIDEX) 0.12 % solution Use as directed 15 mLs in the mouth or throat 2 (two) times daily.    clonazePAM (KLONOPIN) 0.5 MG tablet Take 0.25-0.5 mg by mouth See admin instructions. Take 0.25 mg by mouth in the morning and take 0.5 mg at noon and at bedtime   cyanocobalamin (VITAMIN B12) 500 MCG tablet Take 1 tablet (500 mcg total) by mouth daily.   dexlansoprazole (DEXILANT) 60 MG capsule TAKE 1 CAPSULE BY MOUTH ONCE A DAY.   dicyclomine (BENTYL) 10 MG capsule TAKE 1 CAPSULE UP TO 3 TIMES DAILY DAILY AS NEEDED FOR ABDOMINAL PAIN OR DIARRHEA. HOLD IN THE SETTING OF CONSTIPATION.   divalproex (DEPAKOTE) 250 MG DR tablet Take 250-500 mg by mouth See admin instructions. Take 250 mg by mouth at  0700 and take 250 mg by mouth at 1200. Take 500 mg by mouth at bedtime 2000.   docusate sodium (COLACE) 100 MG capsule Take 100 mg by mouth 2 (two) times daily.   doxepin (SINEQUAN) 10 MG capsule Take 10 mg by mouth at bedtime.   famotidine (PEPCID) 20 MG tablet TAKE 1 TABLET BY MOUTH AT BEDTIME.   gabapentin (NEURONTIN) 400 MG capsule Take 400-1,200 mg by mouth See admin instructions. Take 400 mg by mouth in the morning and take 1200 mg by mouth at bedtime   gabapentin (NEURONTIN) 600 MG tablet    hydrocortisone cream 1 % Apply topically.   Iloperidone 6 MG TABS Take 6 mg by mouth daily.    ketoconazole (NIZORAL) 2 % shampoo Apply 1 application topically 2 (two) times a week. *Lather, leave for 5 minutes, then rinse*   MILK OF MAGNESIA 1200 MG/15ML suspension SMARTSIG:Milliliter(s) By Mouth   Neomycin-Bacitracin-Polymyxin (TRIPLE ANTIBIOTIC) [phone] OINT Apply topically.   Olopatadine HCl 0.2 % SOLN Place 1 drop into both eyes daily.    Omega-3 Fatty Acids (FISH OIL OMEGA-3) 1000 MG CAPS Take 2,000 mg by mouth daily.   ondansetron (ZOFRAN) 4 MG tablet Take 4 mg by mouth at bedtime.   polyethylene glycol powder (MIRALAX) 17 GM/SCOOP powder Take 17 g by mouth daily as needed for mild constipation.   promethazine (PHENERGAN) 25 MG tablet Take by mouth.   QUEtiapine (SEROQUEL XR) 300 MG 24 hr tablet Take 300 mg by mouth at bedtime.   TUSSIN DM 100-10 MG/5ML liquid Take by mouth.   Vitamin D, Ergocalciferol, (DRISDOL) 1.25 MG (50000 UT) CAPS capsule Take 50,000 Units by mouth every 30 (thirty) days.   vitamin E 200 UNIT capsule Take 200 Units by mouth every morning.   zolpidem (AMBIEN) 5 MG tablet Take 5 mg by mouth at bedtime.    No facility-administered encounter medications on file as of 02/13/2023.    ALLERGIES:  No Known Allergies  LABORATORY DATA:  I have reviewed the labs as listed.  CBC    Component Value Date/Time   WBC 6.2 01/30/2023 1121   RBC 5.21 01/30/2023 1121   HGB  10.7 (L) 01/30/2023 1121   HCT 36.4 (L) 01/30/2023 1121   PLT  01/30/2023 1121    PLATELET CLUMPS NOTED ON SMEAR, COUNT APPEARS DECREASED   MCV 69.9 (L) 01/30/2023 1121   MCH 20.5 (L) 01/30/2023 1121   MCHC 29.4 (L) 01/30/2023 1121   RDW 18.6 (H) 01/30/2023 1121   LYMPHSABS 3.1 01/30/2023 1121   MONOABS 0.4 01/30/2023 1121  EOSABS 0.0 01/30/2023 1121   BASOSABS 0.0 01/30/2023 1121      Latest Ref Rng & Units 01/30/2023   11:21 AM 05/24/2021    2:04 PM 10/18/2020    9:03 AM  CMP  Glucose 70 - 99 mg/dL 99  84  710   BUN 6 - 20 mg/dL 14  16  12    Creatinine 0.61 - 1.24 mg/dL 6.26  9.48  5.46   Sodium 135 - 145 mmol/L 136  134  137   Potassium 3.5 - 5.1 mmol/L 3.8  3.8  3.7   Chloride 98 - 111 mmol/L 102  100  103   CO2 22 - 32 mmol/L 25  25  24    Calcium 8.9 - 10.3 mg/dL 8.8  9.3  8.9   Total Protein 6.5 - 8.1 g/dL 6.9  7.3  7.1   Total Bilirubin 0.3 - 1.2 mg/dL 1.0  1.1  1.0   Alkaline Phos 38 - 126 U/L 55  67  67   AST 15 - 41 U/L 24  29  19    ALT 0 - 44 U/L 29  39  26     DIAGNOSTIC IMAGING:  I have independently reviewed the relevant imaging and discussed with the patient.   WRAP UP:  All questions were answered. The patient knows to call the clinic with any problems, questions or concerns.  Medical decision making: ***  Time spent on visit: I spent *** minutes counseling the patient face to face. The total time spent in the appointment was *** minutes and more than 50% was on counseling.  Carnella Guadalajara, PA-C  ***

## 2023-02-13 ENCOUNTER — Inpatient Hospital Stay: Payer: 59 | Attending: Hematology | Admitting: Physician Assistant

## 2023-02-13 ENCOUNTER — Other Ambulatory Visit: Payer: Self-pay

## 2023-02-13 ENCOUNTER — Ambulatory Visit: Payer: Medicaid Other | Admitting: Physician Assistant

## 2023-02-13 VITALS — BP 99/64 | HR 66 | Temp 98.3°F | Resp 17 | Wt 158.3 lb

## 2023-02-13 DIAGNOSIS — D509 Iron deficiency anemia, unspecified: Secondary | ICD-10-CM

## 2023-02-13 DIAGNOSIS — E538 Deficiency of other specified B group vitamins: Secondary | ICD-10-CM

## 2023-02-13 DIAGNOSIS — D696 Thrombocytopenia, unspecified: Secondary | ICD-10-CM

## 2023-02-13 DIAGNOSIS — Z79899 Other long term (current) drug therapy: Secondary | ICD-10-CM | POA: Insufficient documentation

## 2023-02-13 DIAGNOSIS — D563 Thalassemia minor: Secondary | ICD-10-CM

## 2023-02-13 MED ORDER — VITAMIN B-12 1000 MCG PO TABS: 1000.0000 ug | ORAL_TABLET | Freq: Every day | ORAL | 3 refills | Status: DC

## 2023-02-13 NOTE — Patient Instructions (Signed)
[facility] **VISIT SUMMARY & IMPORTANT INSTRUCTIONS **   You were seen today by Rojelio Brenner PA-C for your anemia and low platelets.    ANEMIA: Your anemia is a condition called "beta thalassemia minor."  This means that you have a genetic mutation (inherited from your parents) that causes your blood cells to be smaller than normal and for you to have 2 few blood cells. Your anemia is NOT from iron deficiency. It is important that you STOP taking iron supplement. Do NOT restart any iron supplement without checking with our office first. 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  We will continue to watch your iron levels closely.  LOW PLATELETS: Your low platelets may be related to your fatty liver disease and mildly enlarged spleen.  You may also have some immune system dysfunction that causes your body to attack its own platelets. Your platelets are slightly lower than normal. We need to check an ABDOMINAL ULTRASOUND to check on your liver and spleen.  LOW VITAMIN B-12: Your vitamin B12 levels are slightly low. You should INCREASE your vitamin B12 supplement to 1,000 mcg daily.   FOLLOW-UP APPOINTMENT: Labs in 3 months.  Office visit 1 week after labs.  ** Thank you for trusting me with your healthcare!  I strive to provide all of my patients with quality care at each visit.  If you receive a survey for this visit, I would be so grateful to you for taking the time to provide feedback.  Thank you in advance!  ~ Opaline Reyburn                   Dr. Doreatha Massed   &   Rojelio Brenner, PA-C   - - - - - - - - - - - - - - - - - -    Thank you for choosing [facility] at [facility] to provide your oncology and hematology care.  To afford each patient quality time with our provider, please arrive at least 15 minutes before  your scheduled appointment time.   If you have a lab appointment with the Cancer Center please come in thru the Main Entrance and check in at the main information desk.  You need to re-schedule your appointment should you arrive 10 or more minutes late.  We strive to give you quality time with our providers, and arriving late affects you and other patients whose appointments are after yours.  Also, if you no show three or more times for appointments you may be dismissed from the clinic at the providers discretion.     Again, thank you for choosing [facility].  Our hope is that these requests will decrease the amount of time that you wait before being seen by our physicians.       _____________________________________________________________  Should you have questions after your visit to [facility], please contact our office at [phone] and follow the prompts.  Our office hours are 8:00 a.m. and 4:30 p.m. Monday - Friday.  Please note that voicemails left after 4:00 p.m. may not be returned until the following business day.  We are closed weekends and major holidays.  You do have access to a nurse 24-7, just call the main number to the clinic [phone] and do not press any options, hold on the line and a nurse  will answer the phone.    For prescription refill requests, have your pharmacy contact our office and allow 72 hours.

## 2023-02-26 ENCOUNTER — Ambulatory Visit (HOSPITAL_COMMUNITY)
Admission: RE | Admit: 2023-02-26 | Discharge: 2023-02-26 | Disposition: A | Discharge status: Home / Self Care | Payer: 59 | Source: Ambulatory Visit | Attending: Physician Assistant | Admitting: Physician Assistant

## 2023-02-26 DIAGNOSIS — D696 Thrombocytopenia, unspecified: Secondary | ICD-10-CM | POA: Insufficient documentation

## 2023-02-26 DIAGNOSIS — D6959 Other secondary thrombocytopenia: Secondary | ICD-10-CM | POA: Diagnosis not present

## 2023-04-11 DIAGNOSIS — J069 Acute upper respiratory infection, unspecified: Secondary | ICD-10-CM | POA: Diagnosis not present

## 2023-04-11 DIAGNOSIS — Z299 Encounter for prophylactic measures, unspecified: Secondary | ICD-10-CM | POA: Diagnosis not present

## 2023-04-11 DIAGNOSIS — R070 Pain in throat: Secondary | ICD-10-CM | POA: Diagnosis not present

## 2023-04-18 DIAGNOSIS — I10 Essential (primary) hypertension: Secondary | ICD-10-CM | POA: Diagnosis not present

## 2023-04-18 DIAGNOSIS — K589 Irritable bowel syndrome without diarrhea: Secondary | ICD-10-CM | POA: Diagnosis not present

## 2023-04-18 DIAGNOSIS — R109 Unspecified abdominal pain: Secondary | ICD-10-CM | POA: Diagnosis not present

## 2023-04-18 DIAGNOSIS — Z299 Encounter for prophylactic measures, unspecified: Secondary | ICD-10-CM | POA: Diagnosis not present

## 2023-05-08 ENCOUNTER — Inpatient Hospital Stay: Payer: 59 | Attending: Hematology

## 2023-05-08 ENCOUNTER — Inpatient Hospital Stay (HOSPITAL_COMMUNITY): Admit: 2023-05-08 | Payer: 59

## 2023-05-08 DIAGNOSIS — D563 Thalassemia minor: Secondary | ICD-10-CM | POA: Diagnosis present

## 2023-05-08 DIAGNOSIS — Z79899 Other long term (current) drug therapy: Secondary | ICD-10-CM | POA: Insufficient documentation

## 2023-05-08 DIAGNOSIS — D509 Iron deficiency anemia, unspecified: Secondary | ICD-10-CM | POA: Insufficient documentation

## 2023-05-08 DIAGNOSIS — K760 Fatty (change of) liver, not elsewhere classified: Secondary | ICD-10-CM | POA: Insufficient documentation

## 2023-05-08 DIAGNOSIS — E538 Deficiency of other specified B group vitamins: Secondary | ICD-10-CM | POA: Insufficient documentation

## 2023-05-08 DIAGNOSIS — D696 Thrombocytopenia, unspecified: Secondary | ICD-10-CM | POA: Diagnosis not present

## 2023-05-08 LAB — CBC WITH DIFFERENTIAL/PLATELET
Abs Immature Granulocytes: 0.01 10*3/uL (ref 0.00–0.07)
Basophils Absolute: 0 10*3/uL (ref 0.0–0.1)
Basophils Relative: 0 %
Eosinophils Absolute: 0.1 10*3/uL (ref 0.0–0.5)
Eosinophils Relative: 2 %
HCT: 35.1 % — ABNORMAL LOW (ref 39.0–52.0)
Hemoglobin: 10.4 g/dL — ABNORMAL LOW (ref 13.0–17.0)
Immature Granulocytes: 0 %
Lymphocytes Relative: 55 %
Lymphs Abs: 3.5 10*3/uL (ref 0.7–4.0)
MCH: 21 pg — ABNORMAL LOW (ref 26.0–34.0)
MCHC: 29.6 g/dL — ABNORMAL LOW (ref 30.0–36.0)
MCV: 70.8 fL — ABNORMAL LOW (ref 80.0–100.0)
Monocytes Absolute: 0.4 10*3/uL (ref 0.1–1.0)
Monocytes Relative: 6 %
Neutro Abs: 2.4 10*3/uL (ref 1.7–7.7)
Neutrophils Relative %: 37 %
Platelets: 117 10*3/uL — ABNORMAL LOW (ref 150–400)
RBC: 4.96 MIL/uL (ref 4.22–5.81)
RDW: 17.9 % — ABNORMAL HIGH (ref 11.5–15.5)
WBC: 6.4 10*3/uL (ref 4.0–10.5)
nRBC: 0 % (ref 0.0–0.2)

## 2023-05-08 LAB — IRON AND TIBC
Iron: 133 ug/dL (ref 45–182)
Saturation Ratios: 46 % — ABNORMAL HIGH (ref 17.9–39.5)
TIBC: 291 ug/dL (ref 250–450)
UIBC: 158 ug/dL

## 2023-05-08 LAB — COMPREHENSIVE METABOLIC PANEL
ALT: 21 U/L (ref 0–44)
AST: 18 U/L (ref 15–41)
Albumin: 3.7 g/dL (ref 3.5–5.0)
Alkaline Phosphatase: 69 U/L (ref 38–126)
Anion gap: 9 (ref 5–15)
BUN: 13 mg/dL (ref 6–20)
CO2: 25 mmol/L (ref 22–32)
Calcium: 8.8 mg/dL — ABNORMAL LOW (ref 8.9–10.3)
Chloride: 102 mmol/L (ref 98–111)
Creatinine, Ser: 0.74 mg/dL (ref 0.61–1.24)
GFR, Estimated: >60 mL/min (ref >60)
Glucose, Bld: 91 mg/dL (ref 70–99)
Potassium: 4.4 mmol/L (ref 3.5–5.1)
Sodium: 136 mmol/L (ref 135–145)
Total Bilirubin: 0.6 mg/dL (ref 0.3–1.2)
Total Protein: 7 g/dL (ref 6.5–8.1)

## 2023-05-08 LAB — VITAMIN B12: Vitamin B-12: 721 pg/mL (ref 180–914)

## 2023-05-08 LAB — FERRITIN: Ferritin: 752 ng/mL — ABNORMAL HIGH (ref 24–336)

## 2023-05-11 LAB — METHYLMALONIC ACID, SERUM: Methylmalonic Acid, Quantitative: 131 nmol/L (ref 0–378)

## 2023-05-14 NOTE — Progress Notes (Signed)
[facility] 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  He was last seen by Rojelio Brenner PA-C on 02/13/2023.  He presents today with his caregiver.***   Due to underlying cognitive deficit, patient is poor historian.  *** Caregiver/group home staff member is not aware of any changes in patient's health status.***  ***No abnormal bleeding, bruising, or petechial rash.  No fever, chills, night sweats, unintentional weight loss.  *** Patient has not been taking iron supplements since it was discontinued at his visit in August 2023. *** Patient is taking vitamin B12 500 mcg daily  He has 70***% energy and 100***% appetite. He endorses that he is maintaining a stable weight.  ASSESSMENT & PLAN:  1.  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 - No history of bright red blood per rectum or melena. - Colonoscopy on 12/05/2018 shows one 5 mm polyp in the cecum.  Otherwise normal exam.  Pathology showed tubular adenoma. - EGD on 07/10/2019 shows normal esophagus, stomach, normal duodenal bulb and second part of duodenum. - Hemoglobin electrophoresis consistent with beta thalassemia minor - Most recent labs (05/08/2023): Hgb 10.4/MCV 70.8 - PLAN: Anemia is  stable and secondary to beta thalassemia minor. - Treatment of iron overload as below.***  2.  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  Iron supplement was stopped in August 2023. - Labs (11/02/2022): Ferritin 841, iron saturation 67% with serum iron 212 - Most recent labs (05/08/2023): Ferritin 752, iron saturation 46%.  LFTs are normal on most recent CMP. - PLAN: We will continue close monitoring.  If ferritin >1000, will check MRI liver to see if patient would benefit from iron reduction therapy.***  3.  Thrombocytopenia with vitamin B12 deficiency - He has mild to moderate thrombocytopenia since 2009. - CT scan of abdomen December 2019 showed hepatic steatosis.  Craniocaudal spleen size is measured at 13 cm, slightly more than upper limit of normal. - Previous work-up (September 2020) has shown negative hepatitis B and hepatitis C.  H. pylori IgA level elevated, with normal IgG (nonspecific).  He had negative ANA and rheumatoid factor. - CBC from 06/09/2022 showed platelets lower than normal at 61.  Repeat CBC (08/28/2022) showed persistently low platelets 59. - Additional workup of worsening thrombocytopenia (11/02/2022):  SPEP negative.  Immunofixation normal.  Mildly elevated kappa 32.6, mildly elevated lambda 27.8, normal free light chain ratio 1.17.  Immature platelet fraction is elevated at 15.9% (consistent with peripheral platelet destruction or sequestration) Nutritional panel: Marginal B12 276, normal MMA.  Normal folate and  homocystine.  Normal copper.  - Abdominal ultrasound (02/26/2023): Question of fatty infiltration of liver (coarsened increased echogenicity which can be seen with fatty liver disease, although also can be seen with cirrhosis and some infiltrative disorders, but there is no definite hepatic mass or nodularity and patent vein is patent).  Spleen grossly normal appearance, 11.6 cm length. - Patient is taking vitamin B12 500 mcg daily*** - Most recent labs (05/08/2023): Platelets 117.  Normal WBC/differential.  Normal B12 721/normal MMA. - No bleeding history including nosebleeds, hematuria reported.  No easy bruising.*** - DIFFERENTIAL DIAGNOSIS favors immune mediated thrombocytopenia versus splenomegaly/fatty liver disease. - PLAN: Continue vitamin B12 1000 mcg daily.  - Repeat CBC, B12, MMA with office visit in 6 months     PLAN SUMMARY: >> Labs in 6 months = CBC/D, CMP, B12, MMA, ferritin, iron/TIBC >> OFFICE visit in 6 months (1 week after labs) ***    REVIEW OF SYSTEMS: ***  Review of Systems  Constitutional:  Positive for fatigue. Negative for appetite change, chills, diaphoresis, fever and unexpected weight change.  HENT:   Negative for lump/mass and nosebleeds.   Eyes:  Negative for eye problems.  Respiratory:  Negative for cough, hemoptysis and shortness of breath.   Cardiovascular:  Negative for chest pain, leg swelling and palpitations.  Gastrointestinal:  Negative for abdominal pain, blood in stool, constipation, diarrhea, nausea and vomiting.  Genitourinary:  Negative for hematuria.   Skin: Negative.   Neurological:  Positive for headaches. Negative for dizziness and light-headedness.  Hematological:  Does not bruise/bleed easily.     PHYSICAL EXAM:***  ECOG PERFORMANCE STATUS: 1 - Symptomatic but completely ambulatory  There were no vitals filed for this visit. There were no vitals filed for this visit. Physical Exam Constitutional:      Appearance: Normal appearance. He  is obese.  HENT:     Head: Normocephalic and atraumatic.     Mouth/Throat:     Mouth: Mucous membranes are moist.  Eyes:     Extraocular Movements: Extraocular movements intact.     Pupils: Pupils are equal, round, and reactive to light.  Cardiovascular:     Rate and Rhythm: Normal rate and regular rhythm.     Pulses: Normal pulses.     Heart sounds: Normal heart sounds.  Pulmonary:     Effort: Pulmonary effort is normal.     Breath sounds: Normal breath sounds.  Abdominal:     General: Bowel sounds are normal.     Palpations: Abdomen is soft.     Tenderness: There is no abdominal tenderness.  Musculoskeletal:        General: No swelling.     Right lower leg: No edema.     Left lower leg: No edema.  Lymphadenopathy:     Cervical: No cervical adenopathy.  Skin:    General: Skin is warm and dry.  Neurological:     Mental Status: He is alert. Mental status is at baseline. He is disoriented.  Psychiatric:        Mood and Affect: Mood normal.        Behavior: Behavior normal.     PAST MEDICAL/SURGICAL HISTORY:  Past Medical History:  Diagnosis Date   Anemia    GERD (gastroesophageal reflux disease)    Mental retardation    Mood disorder (HCC)  MVP (mitral valve prolapse)    Recurrent abdominal pain    Seizures (HCC)    last seizure was 2010.Unknown etiology   Thrombocytopenia (HCC)    Past Surgical History:  Procedure Laterality Date   CHOLECYSTECTOMY     COLONOSCOPY WITH PROPOFOL N/A 12/05/2018   PROPOFOL;  Surgeon: Corbin Ade, MD;  One 5 mm polyp in the cecum.  Tubular adenoma.  Due for repeat in 2025.   ESOPHAGOGASTRODUODENOSCOPY (EGD) WITH PROPOFOL N/A 07/10/2019   PROPOFOL;  Surgeon: Corbin Ade, MD; normal exam   POLYPECTOMY  12/05/2018   Procedure: POLYPECTOMY;  Surgeon: Corbin Ade, MD;  Location: [facility];  Service: Endoscopy;;  colon    SOCIAL HISTORY:  Social History   Socioeconomic History   Marital status: Single    Spouse name:  Not on file   Number of children: 0   Years of education: Not on file   Highest education level: Not on file  Occupational History   Not on file  Tobacco Use   Smoking status: Never   Smokeless tobacco: Never  Vaping Use   Vaping Use: Never used  Substance and Sexual Activity   Alcohol use: No   Drug use: No   Sexual activity: Never  Other Topics Concern   Not on file  Social History Narrative   Not on file   Social Determinants of Health   Financial Resource Strain: Not on file  Food Insecurity: Not on file  Transportation Needs: Not on file  Physical Activity: Not on file  Stress: Not on file  Social Connections: Not on file  Intimate Partner Violence: Not on file    FAMILY HISTORY:  Family History  Family history unknown: Yes    CURRENT MEDICATIONS:  Outpatient Encounter Medications as of 05/15/2023  Medication Sig   ACETAMINOPHEN EXTRA STRENGTH 500 MG tablet Take by mouth.   ANTACID 200-200-20 MG/5ML suspension Take by mouth.   BANOPHEN 25 MG capsule Take 25 mg by mouth at bedtime.   chlorhexidine (PERIDEX) 0.12 % solution Use as directed 15 mLs in the mouth or throat 2 (two) times daily.    clonazePAM (KLONOPIN) 0.5 MG tablet Take 0.25-0.5 mg by mouth See admin instructions. Take 0.25 mg by mouth in the morning and take 0.5 mg at noon and at bedtime   cyanocobalamin (VITAMIN B12) 1000 MCG tablet Take 1 tablet (1,000 mcg total) by mouth daily.   dexlansoprazole (DEXILANT) 60 MG capsule TAKE 1 CAPSULE BY MOUTH ONCE A DAY.   dicyclomine (BENTYL) 10 MG capsule TAKE 1 CAPSULE UP TO 3 TIMES DAILY DAILY AS NEEDED FOR ABDOMINAL PAIN OR DIARRHEA. HOLD IN THE SETTING OF CONSTIPATION.   divalproex (DEPAKOTE) 250 MG DR tablet Take 250-500 mg by mouth See admin instructions. Take 250 mg by mouth at 0700 and take 250 mg by mouth at 1200. Take 500 mg by mouth at bedtime 2000.   docusate sodium (COLACE) 100 MG capsule Take 100 mg by mouth 2 (two) times daily.   doxepin (SINEQUAN)  10 MG capsule Take 10 mg by mouth at bedtime.   famotidine (PEPCID) 20 MG tablet TAKE 1 TABLET BY MOUTH AT BEDTIME.   gabapentin (NEURONTIN) 400 MG capsule Take 400-1,200 mg by mouth See admin instructions. Take 400 mg by mouth in the morning and take 1200 mg by mouth at bedtime   gabapentin (NEURONTIN) 600 MG tablet    hydrocortisone cream 1 % Apply topically.   Iloperidone 6 MG TABS Take  6 mg by mouth daily.    ketoconazole (NIZORAL) 2 % shampoo Apply 1 application topically 2 (two) times a week. *Lather, leave for 5 minutes, then rinse*   MILK OF MAGNESIA 1200 MG/15ML suspension SMARTSIG:Milliliter(s) By Mouth   Neomycin-Bacitracin-Polymyxin (TRIPLE ANTIBIOTIC) 3.5-2490717242 OINT Apply topically.   Olopatadine HCl 0.2 % SOLN Place 1 drop into both eyes daily.    Omega-3 Fatty Acids (FISH OIL OMEGA-3) 1000 MG CAPS Take 2,000 mg by mouth daily.   ondansetron (ZOFRAN) 4 MG tablet Take 4 mg by mouth at bedtime.   polyethylene glycol powder (MIRALAX) 17 GM/SCOOP powder Take 17 g by mouth daily as needed for mild constipation.   promethazine (PHENERGAN) 25 MG tablet Take by mouth.   QUEtiapine (SEROQUEL XR) 300 MG 24 hr tablet Take 300 mg by mouth at bedtime.   TUSSIN DM 100-10 MG/5ML liquid Take by mouth.   Vitamin D, Ergocalciferol, (DRISDOL) 1.25 MG (50000 UT) CAPS capsule Take 50,000 Units by mouth every 30 (thirty) days.   vitamin E 200 UNIT capsule Take 200 Units by mouth every morning.   zolpidem (AMBIEN) 5 MG tablet Take 5 mg by mouth at bedtime.    No facility-administered encounter medications on file as of 05/15/2023.    ALLERGIES:  No Known Allergies  LABORATORY DATA:  I have reviewed the labs as listed.  CBC    Component Value Date/Time   WBC 6.4 05/08/2023 1507   RBC 4.96 05/08/2023 1507   HGB 10.4 (L) 05/08/2023 1507   HCT 35.1 (L) 05/08/2023 1507   PLT 117 (L) 05/08/2023 1507   MCV 70.8 (L) 05/08/2023 1507   MCH 21.0 (L) 05/08/2023 1507   MCHC 29.6 (L) 05/08/2023 1507    RDW 17.9 (H) 05/08/2023 1507   LYMPHSABS 3.5 05/08/2023 1507   MONOABS 0.4 05/08/2023 1507   EOSABS 0.1 05/08/2023 1507   BASOSABS 0.0 05/08/2023 1507      Latest Ref Rng & Units 05/08/2023    3:07 PM 01/30/2023   11:21 AM 05/24/2021    2:04 PM  CMP  Glucose 70 - 99 mg/dL 91  99  84   BUN 6 - 20 mg/dL 13  14  16    Creatinine 0.61 - 1.24 mg/dL 4.09  8.11  9.14   Sodium 135 - 145 mmol/L 136  136  134   Potassium 3.5 - 5.1 mmol/L 4.4  3.8  3.8   Chloride 98 - 111 mmol/L 102  102  100   CO2 22 - 32 mmol/L 25  25  25    Calcium 8.9 - 10.3 mg/dL 8.8  8.8  9.3   Total Protein 6.5 - 8.1 g/dL 7.0  6.9  7.3   Total Bilirubin 0.3 - 1.2 mg/dL 0.6  1.0  1.1   Alkaline Phos 38 - 126 U/L 69  55  67   AST 15 - 41 U/L 18  24  29    ALT 0 - 44 U/L 21  29  39     DIAGNOSTIC IMAGING:  I have independently reviewed the relevant imaging and discussed with the patient.   WRAP UP:  All questions were answered. The patient knows to call the clinic with any problems, questions or concerns.  Medical decision making: Moderate  Time spent on visit: I spent 20 minutes counseling the patient face to face. The total time spent in the appointment was 30 minutes and more than 50% was on counseling.  Carnella Guadalajara, PA-C  ***

## 2023-05-15 ENCOUNTER — Inpatient Hospital Stay (HOSPITAL_BASED_OUTPATIENT_CLINIC_OR_DEPARTMENT_OTHER): Payer: 59 | Admitting: Physician Assistant

## 2023-05-15 VITALS — BP 103/60 | HR 81 | Temp 97.4°F | Resp 18 | Wt 155.6 lb

## 2023-05-15 DIAGNOSIS — K760 Fatty (change of) liver, not elsewhere classified: Secondary | ICD-10-CM | POA: Diagnosis not present

## 2023-05-15 DIAGNOSIS — Z79899 Other long term (current) drug therapy: Secondary | ICD-10-CM | POA: Diagnosis not present

## 2023-05-15 DIAGNOSIS — D696 Thrombocytopenia, unspecified: Secondary | ICD-10-CM

## 2023-05-15 DIAGNOSIS — D563 Thalassemia minor: Secondary | ICD-10-CM | POA: Diagnosis not present

## 2023-05-15 DIAGNOSIS — E538 Deficiency of other specified B group vitamins: Secondary | ICD-10-CM | POA: Diagnosis not present

## 2023-05-15 DIAGNOSIS — D509 Iron deficiency anemia, unspecified: Secondary | ICD-10-CM | POA: Diagnosis not present

## 2023-05-15 NOTE — Patient Instructions (Signed)
[facility] at [facility] **VISIT SUMMARY & IMPORTANT INSTRUCTIONS **   You were seen today by Rojelio Brenner PA-C for your anemia and low platelets.    ANEMIA: Your anemia is a condition called "beta thalassemia minor."  This means that you have a genetic mutation (inherited from your parents) that causes your blood cells to be smaller than normal and for you to have 2 few blood cells. Your anemia is NOT from iron deficiency. Do NOT restart any iron supplement without checking with our office first. 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  We will continue to watch your iron levels closely.  LOW PLATELETS: Your low platelets may be related to your fatty liver disease and mildly enlarged spleen.  You may also have some immune system dysfunction that causes your body to attack its own platelets.  LOW VITAMIN B-12: Continue to take vitamin B12 supplement 1,000 mcg daily.   FOLLOW-UP APPOINTMENT: Labs in 6 months.  Office visit 1 week after labs.  ** Thank you for trusting me with your healthcare!  I strive to provide all of my patients with quality care at each visit.  If you receive a survey for this visit, I would be so grateful to you for taking the time to provide feedback.  Thank you in advance!  ~ Jivan Symanski                   Dr. Doreatha Massed   &   Rojelio Brenner, PA-C   - - - - - - - - - - - - - - - - - -    Thank you for choosing [facility] at [facility] to provide your oncology and hematology care.  To afford each patient quality time with our provider, please arrive at least 15 minutes before your scheduled appointment time.   If you have a lab appointment with the Cancer Center please come in thru the Main Entrance and check in at the main information desk.  You need to re-schedule your appointment should you arrive 10  or more minutes late.  We strive to give you quality time with our providers, and arriving late affects you and other patients whose appointments are after yours.  Also, if you no show three or more times for appointments you may be dismissed from the clinic at the providers discretion.     Again, thank you for choosing [facility].  Our hope is that these requests will decrease the amount of time that you wait before being seen by our physicians.       _____________________________________________________________  Should you have questions after your visit to [facility], please contact our office at [phone] and follow the prompts.  Our office hours are 8:00 a.m. and 4:30 p.m. Monday - Friday.  Please note that voicemails left after 4:00 p.m. may not be returned until the following business day.  We are closed weekends and major holidays.  You do have access to a nurse 24-7, just call the main number to the clinic [phone] and do not press any options, hold on the line and a nurse will answer the phone.    For prescription refill requests, have your pharmacy contact our office and allow 72 hours.

## 2023-06-14 ENCOUNTER — Other Ambulatory Visit: Payer: Self-pay | Admitting: Physician Assistant

## 2023-06-14 DIAGNOSIS — E538 Deficiency of other specified B group vitamins: Secondary | ICD-10-CM

## 2023-07-11 DIAGNOSIS — I10 Essential (primary) hypertension: Secondary | ICD-10-CM | POA: Diagnosis not present

## 2023-07-11 DIAGNOSIS — Z0000 Encounter for general adult medical examination without abnormal findings: Secondary | ICD-10-CM | POA: Diagnosis not present

## 2023-07-11 DIAGNOSIS — Z7189 Other specified counseling: Secondary | ICD-10-CM | POA: Diagnosis not present

## 2023-07-11 DIAGNOSIS — Z299 Encounter for prophylactic measures, unspecified: Secondary | ICD-10-CM | POA: Diagnosis not present

## 2023-07-12 DIAGNOSIS — D696 Thrombocytopenia, unspecified: Secondary | ICD-10-CM | POA: Diagnosis not present

## 2023-07-12 DIAGNOSIS — R5383 Other fatigue: Secondary | ICD-10-CM | POA: Diagnosis not present

## 2023-07-12 DIAGNOSIS — Z79899 Other long term (current) drug therapy: Secondary | ICD-10-CM | POA: Diagnosis not present

## 2023-07-12 DIAGNOSIS — Z0000 Encounter for general adult medical examination without abnormal findings: Secondary | ICD-10-CM | POA: Diagnosis not present

## 2023-10-10 DIAGNOSIS — Z299 Encounter for prophylactic measures, unspecified: Secondary | ICD-10-CM | POA: Diagnosis not present

## 2023-10-10 DIAGNOSIS — I10 Essential (primary) hypertension: Secondary | ICD-10-CM | POA: Diagnosis not present

## 2023-11-13 ENCOUNTER — Inpatient Hospital Stay: Payer: 59 | Attending: Hematology

## 2023-11-13 DIAGNOSIS — Z79899 Other long term (current) drug therapy: Secondary | ICD-10-CM | POA: Insufficient documentation

## 2023-11-13 DIAGNOSIS — D563 Thalassemia minor: Secondary | ICD-10-CM | POA: Insufficient documentation

## 2023-11-13 DIAGNOSIS — D696 Thrombocytopenia, unspecified: Secondary | ICD-10-CM | POA: Diagnosis not present

## 2023-11-13 DIAGNOSIS — E538 Deficiency of other specified B group vitamins: Secondary | ICD-10-CM | POA: Diagnosis not present

## 2023-11-13 LAB — COMPREHENSIVE METABOLIC PANEL
ALT: 21 U/L (ref 0–44)
AST: 20 U/L (ref 15–41)
Albumin: 4 g/dL (ref 3.5–5.0)
Alkaline Phosphatase: 60 U/L (ref 38–126)
Anion gap: 8 (ref 5–15)
BUN: 18 mg/dL (ref 6–20)
CO2: 25 mmol/L (ref 22–32)
Calcium: 9.2 mg/dL (ref 8.9–10.3)
Chloride: 105 mmol/L (ref 98–111)
Creatinine, Ser: 0.57 mg/dL — ABNORMAL LOW (ref 0.61–1.24)
GFR, Estimated: >60 mL/min (ref >60)
Glucose, Bld: 114 mg/dL — ABNORMAL HIGH (ref 70–99)
Potassium: 3.9 mmol/L (ref 3.5–5.1)
Sodium: 138 mmol/L (ref 135–145)
Total Bilirubin: 1 mg/dL (ref 0.0–1.2)
Total Protein: 7.2 g/dL (ref 6.5–8.1)

## 2023-11-13 LAB — CBC WITH DIFFERENTIAL/PLATELET
Abs Immature Granulocytes: 0.01 10*3/uL (ref 0.00–0.07)
Basophils Absolute: 0 10*3/uL (ref 0.0–0.1)
Basophils Relative: 0 %
Eosinophils Absolute: 0.1 10*3/uL (ref 0.0–0.5)
Eosinophils Relative: 2 %
HCT: 38.1 % — ABNORMAL LOW (ref 39.0–52.0)
Hemoglobin: 11.3 g/dL — ABNORMAL LOW (ref 13.0–17.0)
Immature Granulocytes: 0 %
Lymphocytes Relative: 50 %
Lymphs Abs: 3 10*3/uL (ref 0.7–4.0)
MCH: 20.5 pg — ABNORMAL LOW (ref 26.0–34.0)
MCHC: 29.7 g/dL — ABNORMAL LOW (ref 30.0–36.0)
MCV: 69.3 fL — ABNORMAL LOW (ref 80.0–100.0)
Monocytes Absolute: 0.3 10*3/uL (ref 0.1–1.0)
Monocytes Relative: 6 %
Neutro Abs: 2.6 10*3/uL (ref 1.7–7.7)
Neutrophils Relative %: 42 %
Platelets: UNDETERMINED 10*3/uL (ref 150–400)
RBC: 5.5 MIL/uL (ref 4.22–5.81)
RDW: 18.6 % — ABNORMAL HIGH (ref 11.5–15.5)
WBC: 6 10*3/uL (ref 4.0–10.5)
nRBC: 0 % (ref 0.0–0.2)

## 2023-11-13 LAB — VITAMIN B12: Vitamin B-12: 800 pg/mL (ref 180–914)

## 2023-11-13 LAB — IRON AND TIBC
Iron: 177 ug/dL (ref 45–182)
Saturation Ratios: 55 % — ABNORMAL HIGH (ref 17.9–39.5)
TIBC: 321 ug/dL (ref 250–450)
UIBC: 144 ug/dL

## 2023-11-13 LAB — FERRITIN: Ferritin: 677 ng/mL — ABNORMAL HIGH (ref 24–336)

## 2023-11-15 ENCOUNTER — Encounter: Payer: Self-pay | Admitting: *Deleted

## 2023-11-15 LAB — METHYLMALONIC ACID, SERUM: Methylmalonic Acid, Quantitative: 95 nmol/L (ref 0–378)

## 2023-11-20 ENCOUNTER — Inpatient Hospital Stay: Payer: 59 | Admitting: Physician Assistant

## 2023-11-29 ENCOUNTER — Inpatient Hospital Stay: Payer: 59 | Admitting: Oncology

## 2023-11-29 VITALS — BP 87/62 | HR 67 | Temp 97.8°F | Resp 17 | Wt 153.0 lb

## 2023-11-29 DIAGNOSIS — D563 Thalassemia minor: Secondary | ICD-10-CM

## 2023-11-29 DIAGNOSIS — E538 Deficiency of other specified B group vitamins: Secondary | ICD-10-CM

## 2023-11-29 DIAGNOSIS — D696 Thrombocytopenia, unspecified: Secondary | ICD-10-CM | POA: Diagnosis not present

## 2023-11-29 DIAGNOSIS — Z79899 Other long term (current) drug therapy: Secondary | ICD-10-CM | POA: Diagnosis not present

## 2023-11-29 NOTE — Progress Notes (Signed)
[facility] [address], [place] [address]   CLINIC:  Medical Oncology/Hematology  PCP:  Kirstie Peri, MD [address] 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  He was last seen by Rojelio Brenner PA-C on 05/15/23.  He presents today with his caregiver.   Due to underlying cognitive deficit, patient is poor historian.   Caregiver/group home staff member is not aware of any changes in patient's health status.  Denies any recent hospitalizations, surgeries or changes to his baseline health.  No abnormal bleeding, bruising, or petechial rash.  No fever, chills, night sweats, unintentional weight loss. Patient is taking vitamin B12 500 mcg daily.   He has 80% energy and 100% appetite. He endorses that he is maintaining a stable weight.  ASSESSMENT & PLAN:  1.  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 - No history of bright red blood per rectum or melena. - Colonoscopy on 12/05/2018 shows one 5 mm polyp in the cecum.  Otherwise normal exam.  Pathology showed tubular adenoma. - EGD on 07/10/2019 shows normal esophagus, stomach, normal duodenal bulb and second part of duodenum. - Hemoglobin electrophoresis consistent with beta thalassemia minor  2.  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  Iron supplement was stopped in August 2023.  3.  Thrombocytopenia with vitamin B12 deficiency - He has mild to moderate thrombocytopenia since 2009. - CT scan of abdomen December 2019 showed hepatic steatosis.  Craniocaudal spleen size is measured at 13 cm, slightly more than upper limit of normal. - Previous work-up (September 2020) has shown negative hepatitis B and hepatitis C.  H. pylori IgA level elevated, with normal IgG (nonspecific).  He had negative ANA and rheumatoid factor. - CBC from 06/09/2022 showed platelets lower than normal at 61.  Repeat CBC (08/28/2022) showed persistently low platelets 59. - Additional workup of worsening thrombocytopenia (11/02/2022):  SPEP negative.  Immunofixation normal.  Mildly elevated kappa 32.6, mildly elevated lambda 27.8, normal free light chain ratio 1.17. Immature platelet fraction is elevated at 15.9% (consistent with peripheral platelet destruction or sequestration) Nutritional panel: Marginal B12 276, normal MMA.  Normal folate and homocystine.  Normal copper.  - Abdominal ultrasound (02/26/2023): Question of fatty infiltration of liver (coarsened increased echogenicity which can be seen with fatty liver disease, although also can be seen with cirrhosis and some infiltrative disorders, but there is no definite hepatic mass or nodularity and patent vein is patent).  Spleen grossly normal appearance, 11.6 cm length. - Patient is taking  vitamin B12 1000 mcg daily     PLAN: 1. Thrombocytopenia, unspecified (HCC) (Primary) - Most recent labs (11/13/23): Platelet  clumping.  Normal WBC/differential.  Normal B12 721/normal MMA. - No bleeding history including nosebleeds, hematuria reported.  No easy bruising. - DIFFERENTIAL DIAGNOSIS favors immune mediated thrombocytopenia versus splenomegaly/fatty liver disease. - Continue vitamin B12 1000 mcg daily.  - Repeat CBC, B12, MMA with office visit in 6 months.  2. Beta thalassemia minor - Most recent labs (11/13/23): Hgb 11.3/MCV 69.3 -  Anemia is stable and secondary to beta thalassemia minor. - Treatment of iron overload as below.  3. Iron overload - Most recent labs (11/13/23): Ferritin 677, iron saturation 55%.  LFTs are normal on most recent CMP. - We will continue close monitoring.  If ferritin >1000, will check MRI liver to see if patient would benefit from iron reduction therapy.   PLAN SUMMARY: >> Labs in 6 months = CBC/D, CMP, B12, MMA, ferritin, iron/TIBC >> OFFICE visit in 6 months (1 week after labs)     REVIEW OF SYSTEMS:   Review of Systems  Constitutional: Negative.  Negative for appetite change, chills, fatigue and fever.  HENT:  Negative.  Negative for hearing loss, lump/mass, mouth sores and nosebleeds.   Eyes: Negative.  Negative for eye problems.  Respiratory:  Negative for cough, hemoptysis and shortness of breath.   Cardiovascular: Negative.  Negative for chest pain and leg swelling.  Gastrointestinal: Negative.  Negative for abdominal pain, blood in stool, constipation, diarrhea, nausea and vomiting.  Endocrine: Negative.  Negative for hot flashes.  Genitourinary: Negative.  Negative for bladder incontinence, difficulty urinating, dysuria, frequency and hematuria.   Musculoskeletal: Negative.  Negative for back pain, flank pain, gait problem and myalgias.  Skin: Negative.  Negative for itching and rash.  Neurological: Negative.  Negative for dizziness, gait problem, headaches, light-headedness and numbness.  Hematological: Negative.  Negative for adenopathy.   Psychiatric/Behavioral:  Negative for confusion. The patient is not nervous/anxious.      PHYSICAL EXAM:  ECOG PERFORMANCE STATUS: 1 - Symptomatic but completely ambulatory  There were no vitals filed for this visit. There were no vitals filed for this visit. Physical Exam Constitutional:      Appearance: Normal appearance.  Cardiovascular:     Rate and Rhythm: Normal rate and regular rhythm.  Pulmonary:     Effort: Pulmonary effort is normal.     Breath sounds: Normal breath sounds.  Abdominal:     General: Bowel sounds are normal.     Palpations: Abdomen is soft.  Musculoskeletal:        General: No swelling. Normal range of motion.  Neurological:     Mental Status: He is alert and oriented to person, place, and time. Mental status is at baseline.     PAST MEDICAL/SURGICAL HISTORY:  Past Medical History:  Diagnosis Date   Anemia    GERD (gastroesophageal reflux disease)    Mental retardation    Mood disorder (HCC)    MVP (mitral valve prolapse)    Recurrent abdominal pain    Seizures (HCC)    last seizure was 2010.Unknown etiology   Thrombocytopenia (HCC)    Past Surgical History:  Procedure Laterality Date   CHOLECYSTECTOMY     COLONOSCOPY WITH PROPOFOL N/A 12/05/2018   PROPOFOL;  Surgeon: Corbin Ade, MD;  One 5 mm polyp in the cecum.  Tubular adenoma.  Due for repeat in 2025.   ESOPHAGOGASTRODUODENOSCOPY (EGD) WITH PROPOFOL N/A 07/10/2019  PROPOFOL;  Surgeon: Corbin Ade, MD; normal exam   POLYPECTOMY  12/05/2018   Procedure: POLYPECTOMY;  Surgeon: Corbin Ade, MD;  Location: [facility];  Service: Endoscopy;;  colon    SOCIAL HISTORY:  Social History   Socioeconomic History   Marital status: Single    Spouse name: Not on file   Number of children: 0   Years of education: Not on file   Highest education level: Not on file  Occupational History   Not on file  Tobacco Use   Smoking status: Never   Smokeless tobacco: Never  Vaping Use    Vaping status: Never Used  Substance and Sexual Activity   Alcohol use: No   Drug use: No   Sexual activity: Never  Other Topics Concern   Not on file  Social History Narrative   Not on file   Social Drivers of Health   Financial Resource Strain: Not on file  Food Insecurity: Not on file  Transportation Needs: Not on file  Physical Activity: Not on file  Stress: Not on file  Social Connections: Not on file  Intimate Partner Violence: Not on file    FAMILY HISTORY:  Family History  Family history unknown: Yes    CURRENT MEDICATIONS:  Outpatient Encounter Medications as of 11/29/2023  Medication Sig   ACETAMINOPHEN EXTRA STRENGTH 500 MG tablet Take by mouth.   ANTACID 200-200-20 MG/5ML suspension Take by mouth.   BANOPHEN 25 MG capsule Take 25 mg by mouth at bedtime.   chlorhexidine (PERIDEX) 0.12 % solution Use as directed 15 mLs in the mouth or throat 2 (two) times daily.    clonazePAM (KLONOPIN) 0.5 MG tablet Take 0.25-0.5 mg by mouth See admin instructions. Take 0.25 mg by mouth in the morning and take 0.5 mg at noon and at bedtime   cyanocobalamin (VITAMIN B12) 1000 MCG tablet TAKE 1 TABLET BY MOUTH ONCE DAILY.   dexlansoprazole (DEXILANT) 60 MG capsule TAKE 1 CAPSULE BY MOUTH ONCE A DAY.   dicyclomine (BENTYL) 10 MG capsule TAKE 1 CAPSULE UP TO 3 TIMES DAILY DAILY AS NEEDED FOR ABDOMINAL PAIN OR DIARRHEA. HOLD IN THE SETTING OF CONSTIPATION.   divalproex (DEPAKOTE) 250 MG DR tablet Take 250-500 mg by mouth See admin instructions. Take 250 mg by mouth at 0700 and take 250 mg by mouth at 1200. Take 500 mg by mouth at bedtime 2000.   docusate sodium (COLACE) 100 MG capsule Take 100 mg by mouth 2 (two) times daily.   doxepin (SINEQUAN) 10 MG capsule Take 10 mg by mouth at bedtime.   famotidine (PEPCID) 20 MG tablet TAKE 1 TABLET BY MOUTH AT BEDTIME.   gabapentin (NEURONTIN) 400 MG capsule Take 400-1,200 mg by mouth See admin instructions. Take 400 mg by mouth in the morning  and take 1200 mg by mouth at bedtime   gabapentin (NEURONTIN) 600 MG tablet    hydrocortisone cream 1 % Apply topically.   Iloperidone 6 MG TABS Take 6 mg by mouth daily.    ketoconazole (NIZORAL) 2 % shampoo Apply 1 application topically 2 (two) times a week. *Lather, leave for 5 minutes, then rinse*   lisinopril (ZESTRIL) 20 MG tablet Take 20 mg by mouth daily.   MILK OF MAGNESIA 1200 MG/15ML suspension SMARTSIG:Milliliter(s) By Mouth   Neomycin-Bacitracin-Polymyxin (TRIPLE ANTIBIOTIC) [phone] OINT Apply topically.   Olopatadine HCl 0.2 % SOLN Place 1 drop into both eyes daily.    Omega-3 Fatty Acids (FISH OIL OMEGA-3) 1000 MG  CAPS Take 2,000 mg by mouth daily.   ondansetron (ZOFRAN) 4 MG tablet Take 4 mg by mouth at bedtime.   polyethylene glycol powder (MIRALAX) 17 GM/SCOOP powder Take 17 g by mouth daily as needed for mild constipation.   promethazine (PHENERGAN) 25 MG tablet Take by mouth.   QUEtiapine (SEROQUEL XR) 300 MG 24 hr tablet Take 300 mg by mouth at bedtime.   TUSSIN DM 100-10 MG/5ML liquid Take by mouth.   Vitamin D, Ergocalciferol, (DRISDOL) 1.25 MG (50000 UT) CAPS capsule Take 50,000 Units by mouth every 30 (thirty) days.   vitamin E 200 UNIT capsule Take 200 Units by mouth every morning.   zolpidem (AMBIEN) 5 MG tablet Take 5 mg by mouth at bedtime.    No facility-administered encounter medications on file as of 11/29/2023.    ALLERGIES:  No Known Allergies  LABORATORY DATA:  I have reviewed the labs as listed.  CBC    Component Value Date/Time   WBC 6.0 11/13/2023 1352   RBC 5.50 11/13/2023 1352   HGB 11.3 (L) 11/13/2023 1352   HCT 38.1 (L) 11/13/2023 1352   PLT PLATELET CLUMPS NOTED ON SMEAR, UNABLE TO ESTIMATE 11/13/2023 1352   MCV 69.3 (L) 11/13/2023 1352   MCH 20.5 (L) 11/13/2023 1352   MCHC 29.7 (L) 11/13/2023 1352   RDW 18.6 (H) 11/13/2023 1352   LYMPHSABS 3.0 11/13/2023 1352   MONOABS 0.3 11/13/2023 1352   EOSABS 0.1 11/13/2023 1352   BASOSABS  0.0 11/13/2023 1352      Latest Ref Rng & Units 11/13/2023    1:52 PM 05/08/2023    3:07 PM 01/30/2023   11:21 AM  CMP  Glucose 70 - 99 mg/dL 161  91  99   BUN 6 - 20 mg/dL 18  13  14    Creatinine 0.61 - 1.24 mg/dL 0.96  0.45  4.09   Sodium 135 - 145 mmol/L 138  136  136   Potassium 3.5 - 5.1 mmol/L 3.9  4.4  3.8   Chloride 98 - 111 mmol/L 105  102  102   CO2 22 - 32 mmol/L 25  25  25    Calcium 8.9 - 10.3 mg/dL 9.2  8.8  8.8   Total Protein 6.5 - 8.1 g/dL 7.2  7.0  6.9   Total Bilirubin 0.0 - 1.2 mg/dL 1.0  0.6  1.0   Alkaline Phos 38 - 126 U/L 60  69  55   AST 15 - 41 U/L 20  18  24    ALT 0 - 44 U/L 21  21  29      DIAGNOSTIC IMAGING:  I have independently reviewed the relevant imaging and discussed with the patient.   WRAP UP:  All questions were answered. The patient knows to call the clinic with any problems, questions or concerns.  Medical decision making: Moderate  Time spent on visit: I spent 20 minutes counseling the patient face to face. The total time spent in the appointment was 30 minutes and more than 50% was on counseling.  Mauro Kaufmann, NP  11/29/23 11:01 AM

## 2023-12-04 ENCOUNTER — Telehealth: Payer: Self-pay | Admitting: *Deleted

## 2023-12-04 NOTE — Telephone Encounter (Signed)
Procedure: COLONOSCOPY  Estimated body mass index is 27.98 kg/m as calculated from the following:   Height as of 08/27/20: 5\' 2"  (1.575 m).   Weight as of 11/29/23: 153 lb (69.4 kg).   Have you had a colonoscopy before?  12/05/2018, Dr. Jena Gauss  Do you have family history of colon cancer?  no  Do you have a family history of polyps? no  Previous colonoscopy with polyps removed? no  Do you have a history colorectal cancer?   no  Are you diabetic?  no  Do you have a prosthetic or mechanical heart valve? no  Do you have a pacemaker/defibrillator?   no  Have you had endocarditis/atrial fibrillation?  no  Do you use supplemental oxygen/CPAP?  no  Have you had joint replacement within the last 12 months?  no  Do you tend to be constipated or have to use laxatives?  no   Do you have history of alcohol use? If yes, how much and how often.  no  Do you have history or are you using drugs? If yes, what do are you  using?  no  Have you ever had a stroke/heart attack?  no  Have you ever had a heart or other vascular stent placed,?no  Do you take weight loss medication? no Do you take any blood-thinning medications such as: (Plavix, aspirin, Coumadin, Aggrenox, Brilinta, Xarelto, Eliquis, Pradaxa, Savaysa or Effient)? no  If yes we need the name, milligram, dosage and who is prescribing doctor:               Current Outpatient Medications  Medication Sig Dispense Refill   ACETAMINOPHEN EXTRA STRENGTH 500 MG tablet Take by mouth.     ANTACID 200-200-20 MG/5ML suspension Take by mouth.     BANOPHEN 25 MG capsule Take 25 mg by mouth at bedtime.     chlorhexidine (PERIDEX) 0.12 % solution Use as directed 15 mLs in the mouth or throat 2 (two) times daily.      clonazePAM (KLONOPIN) 0.5 MG tablet Take 0.25-0.5 mg by mouth See admin instructions. Take 0.25 mg by mouth in the morning and take 0.5 mg at noon and at bedtime     cyanocobalamin (VITAMIN B12) 1000 MCG tablet TAKE 1 TABLET BY  MOUTH ONCE DAILY. 30 tablet 11   dexlansoprazole (DEXILANT) 60 MG capsule TAKE 1 CAPSULE BY MOUTH ONCE A DAY. 30 capsule 2   dicyclomine (BENTYL) 10 MG capsule TAKE 1 CAPSULE UP TO 3 TIMES DAILY DAILY AS NEEDED FOR ABDOMINAL PAIN OR DIARRHEA. HOLD IN THE SETTING OF CONSTIPATION. 30 capsule 3   divalproex (DEPAKOTE) 250 MG DR tablet Take 250-500 mg by mouth See admin instructions. Take 250 mg by mouth at 0700 and take 250 mg by mouth at 1200. Take 500 mg by mouth at bedtime 2000.     docusate sodium (COLACE) 100 MG capsule Take 100 mg by mouth 2 (two) times daily.     doxepin (SINEQUAN) 10 MG capsule Take 10 mg by mouth at bedtime.     famotidine (PEPCID) 20 MG tablet TAKE 1 TABLET BY MOUTH AT BEDTIME. 30 tablet 11   FLUBLOK 0.5 ML SOSY      gabapentin (NEURONTIN) 400 MG capsule Take 400-1,200 mg by mouth See admin instructions. Take 400 mg by mouth in the morning and take 1200 mg by mouth at bedtime     gabapentin (NEURONTIN) 600 MG tablet      hydrocortisone cream 1 % Apply topically.  Iloperidone 6 MG TABS Take 6 mg by mouth daily.      ketoconazole (NIZORAL) 2 % shampoo Apply 1 application topically 2 (two) times a week. *Lather, leave for 5 minutes, then rinse*     lisinopril (ZESTRIL) 20 MG tablet Take 20 mg by mouth daily.     MILK OF MAGNESIA 1200 MG/15ML suspension SMARTSIG:Milliliter(s) By Mouth     Neomycin-Bacitracin-Polymyxin (TRIPLE ANTIBIOTIC) [phone] OINT Apply topically.     Olopatadine HCl 0.2 % SOLN Place 1 drop into both eyes daily.      Omega-3 Fatty Acids (FISH OIL OMEGA-3) 1000 MG CAPS Take 2,000 mg by mouth daily.     ondansetron (ZOFRAN) 4 MG tablet Take 4 mg by mouth at bedtime.     polyethylene glycol powder (MIRALAX) 17 GM/SCOOP powder Take 17 g by mouth daily as needed for mild constipation. 255 g 3   promethazine (PHENERGAN) 25 MG tablet Take by mouth.     QUEtiapine (SEROQUEL XR) 300 MG 24 hr tablet Take 300 mg by mouth at bedtime.     SPIKEVAX syringe       TUSSIN DM 100-10 MG/5ML liquid Take by mouth.     Vitamin D, Ergocalciferol, (DRISDOL) 1.25 MG (50000 UT) CAPS capsule Take 50,000 Units by mouth every 30 (thirty) days.     vitamin E 200 UNIT capsule Take 200 Units by mouth every morning.     zolpidem (AMBIEN) 5 MG tablet Take 5 mg by mouth at bedtime.      No current facility-administered medications for this visit.    No Known Allergies

## 2023-12-12 NOTE — Telephone Encounter (Signed)
 Ok to schedule.ASA3. room 1/2 ok.

## 2023-12-13 NOTE — Telephone Encounter (Signed)
 Called, no answer and not able to leave VM

## 2024-01-08 NOTE — Telephone Encounter (Signed)
Called pt, no answer. Letter mailed

## 2024-01-15 ENCOUNTER — Telehealth: Payer: Self-pay | Admitting: Internal Medicine

## 2024-01-15 NOTE — Telephone Encounter (Signed)
 The patient left a message that he needed to schedule his colonoscopy.  I also saw the letter that was sent out that there was an issue with getting in touch with the patient.  The phone # he left is [phone]

## 2024-01-15 NOTE — Telephone Encounter (Signed)
Called pt, no answer and not able to leave VM

## 2024-01-16 ENCOUNTER — Encounter: Payer: Self-pay | Admitting: *Deleted

## 2024-01-16 ENCOUNTER — Other Ambulatory Visit: Payer: Self-pay | Admitting: *Deleted

## 2024-01-16 MED ORDER — PEG 3350-KCL-NA BICARB-NACL 420 G PO SOLR: 4000.0000 mL | Freq: Once | ORAL | 0 refills | Status: AC

## 2024-01-16 NOTE — Telephone Encounter (Signed)
 Pt has been scheduled for 02/28/24 with Dr.Rourk. instructions mailed and prep sent to the pharmacy.

## 2024-01-17 NOTE — Telephone Encounter (Signed)
 Questionnaire from recall, no referral needed

## 2024-02-06 DIAGNOSIS — Z6824 Body mass index (BMI) 24.0-24.9, adult: Secondary | ICD-10-CM | POA: Diagnosis not present

## 2024-02-06 DIAGNOSIS — Z299 Encounter for prophylactic measures, unspecified: Secondary | ICD-10-CM | POA: Diagnosis not present

## 2024-02-06 DIAGNOSIS — I10 Essential (primary) hypertension: Secondary | ICD-10-CM | POA: Diagnosis not present

## 2024-02-27 MED ORDER — SIMETHICONE 40 MG/0.6ML PO SUSP: 40.0000 mg | Freq: Four times a day (QID) | ORAL | 0 refills | Status: DC | PRN

## 2024-02-27 MED ORDER — BISACODYL EC 5 MG PO TBEC: 5.0000 mg | DELAYED_RELEASE_TABLET | Freq: Every day | ORAL | 0 refills | Status: DC | PRN

## 2024-02-27 MED ORDER — FLEET ENEMA RE ENEM: 1.0000 | ENEMA | Freq: Once | RECTAL | 0 refills | Status: AC

## 2024-02-27 NOTE — Addendum Note (Signed)
 Addended by: Alvester Johnson on: 02/27/2024 01:26 PM   Modules accepted: Orders

## 2024-02-27 NOTE — Telephone Encounter (Signed)
 Angela left vm stating that pt was in a group home and needs for the otc medications that he needs for his prep needs to be into [facility]. Prescriptions faxed

## 2024-02-27 NOTE — Telephone Encounter (Signed)
 Group home worker informed of providers message. Verbalized understanding.   Suzette Espy, MD If he followed the instructions and took his oral prep and stayed on clear liquids as indicated, I think we can work with him tomorrow morning. hold off on the enemas at the group home. Thanks.     Alvester Johnson, LPN good afternoon. This pt is on your schedule tomorrow morning. He is in a group home and they are saying that they will not be able to do the enema because he is agitated. They are wanting to know if the enema can be done tomorrow morning at the hospital. Please advise. Thank you

## 2024-02-28 ENCOUNTER — Encounter (HOSPITAL_COMMUNITY)
Admission: RE | Disposition: A | Discharge status: Home / Self Care | Payer: Self-pay | Source: Home / Self Care | Attending: Internal Medicine

## 2024-02-28 ENCOUNTER — Ambulatory Visit (HOSPITAL_COMMUNITY)
Admission: RE | Admit: 2024-02-28 | Discharge: 2024-02-28 | Disposition: A | Discharge status: Home / Self Care | Attending: Internal Medicine | Admitting: Internal Medicine

## 2024-02-28 ENCOUNTER — Encounter (HOSPITAL_COMMUNITY): Payer: Self-pay | Admitting: Internal Medicine

## 2024-02-28 ENCOUNTER — Ambulatory Visit (HOSPITAL_COMMUNITY): Admitting: Anesthesiology

## 2024-02-28 ENCOUNTER — Other Ambulatory Visit: Payer: Self-pay

## 2024-02-28 DIAGNOSIS — Z860100 Personal history of colon polyps, unspecified: Secondary | ICD-10-CM | POA: Diagnosis not present

## 2024-02-28 DIAGNOSIS — Z1211 Encounter for screening for malignant neoplasm of colon: Secondary | ICD-10-CM | POA: Insufficient documentation

## 2024-02-28 DIAGNOSIS — K635 Polyp of colon: Secondary | ICD-10-CM | POA: Diagnosis not present

## 2024-02-28 DIAGNOSIS — Z860101 Personal history of adenomatous and serrated colon polyps: Secondary | ICD-10-CM | POA: Diagnosis not present

## 2024-02-28 DIAGNOSIS — D124 Benign neoplasm of descending colon: Secondary | ICD-10-CM | POA: Insufficient documentation

## 2024-02-28 DIAGNOSIS — K219 Gastro-esophageal reflux disease without esophagitis: Secondary | ICD-10-CM | POA: Insufficient documentation

## 2024-02-28 DIAGNOSIS — I10 Essential (primary) hypertension: Secondary | ICD-10-CM | POA: Insufficient documentation

## 2024-02-28 DIAGNOSIS — R569 Unspecified convulsions: Secondary | ICD-10-CM | POA: Diagnosis not present

## 2024-02-28 HISTORY — PX: COLONOSCOPY: SHX5424

## 2024-02-28 SURGERY — COLONOSCOPY
Anesthesia: General

## 2024-02-28 MED ORDER — LIDOCAINE 2% (20 MG/ML) 5 ML SYRINGE
INTRAMUSCULAR | Status: AC
  Filled 2024-02-28: qty 5

## 2024-02-28 MED ORDER — LACTATED RINGERS IV SOLN: INTRAVENOUS | Status: DC

## 2024-02-28 MED ORDER — PROPOFOL 500 MG/50ML IV EMUL
INTRAVENOUS | Status: DC | PRN
  Administered 2024-02-28: 125 ug/kg/min via INTRAVENOUS

## 2024-02-28 MED ORDER — PROPOFOL 500 MG/50ML IV EMUL
INTRAVENOUS | Status: AC
  Filled 2024-02-28: qty 50

## 2024-02-28 MED ORDER — LIDOCAINE 2% (20 MG/ML) 5 ML SYRINGE
INTRAMUSCULAR | Status: DC | PRN
  Administered 2024-02-28: 60 mg via INTRAVENOUS

## 2024-02-28 MED ORDER — PROPOFOL 10 MG/ML IV BOLUS
INTRAVENOUS | Status: DC | PRN
  Administered 2024-02-28: 40 mg via INTRAVENOUS
  Administered 2024-02-28: 30 mg via INTRAVENOUS
  Administered 2024-02-28: 70 mg via INTRAVENOUS

## 2024-02-28 MED ORDER — PHENYLEPHRINE 80 MCG/ML (10ML) SYRINGE FOR IV PUSH (FOR BLOOD PRESSURE SUPPORT)
PREFILLED_SYRINGE | INTRAVENOUS | Status: DC | PRN
  Administered 2024-02-28 (×2): 80 ug via INTRAVENOUS
  Administered 2024-02-28: 160 ug via INTRAVENOUS
  Administered 2024-02-28: 40 ug via INTRAVENOUS
  Administered 2024-02-28: 160 ug via INTRAVENOUS

## 2024-02-28 MED ORDER — EPHEDRINE SULFATE-NACL 50-0.9 MG/10ML-% IV SOSY
PREFILLED_SYRINGE | INTRAVENOUS | Status: DC | PRN
  Administered 2024-02-28: 10 mg via INTRAVENOUS

## 2024-02-28 NOTE — Discharge Instructions (Addendum)
  Colonoscopy Discharge Instructions  Read the instructions outlined below and refer to this sheet in the next few weeks. These discharge instructions provide you with general information on caring for yourself after you leave the hospital. Your doctor may also give you specific instructions. While your treatment has been planned according to the most current medical practices available, unavoidable complications occasionally occur. If you have any problems or questions after discharge, call Dr. Riley Cheadle at [phone]. ACTIVITY You may resume your regular activity, but move at a slower pace for the next 24 hours.  Take frequent rest periods for the next 24 hours.  Walking will help get rid of the air and reduce the bloated feeling in your belly (abdomen).  No driving for 24 hours (because of the medicine (anesthesia) used during the test).   Do not sign any important legal documents or operate any machinery for 24 hours (because of the anesthesia used during the test).  NUTRITION Drink plenty of fluids.  You may resume your normal diet as instructed by your doctor.  Begin with a light meal and progress to your normal diet. Heavy or fried foods are harder to digest and may make you feel sick to your stomach (nauseated).  Avoid alcoholic beverages for 24 hours or as instructed.  MEDICATIONS You may resume your normal medications unless your doctor tells you otherwise.  WHAT YOU CAN EXPECT TODAY Some feelings of bloating in the abdomen.  Passage of more gas than usual.  Spotting of blood in your stool or on the toilet paper.  IF YOU HAD POLYPS REMOVED DURING THE COLONOSCOPY: No aspirin  products for 7 days or as instructed.  No alcohol  for 7 days or as instructed.  Eat a soft diet for the next 24 hours.  FINDING OUT THE RESULTS OF YOUR TEST Not all test results are available during your visit. If your test results are not back during the visit, make an appointment with your caregiver to find out the  results. Do not assume everything is normal if you have not heard from your caregiver or the medical facility. It is important for you to follow up on all of your test results.  SEEK IMMEDIATE MEDICAL ATTENTION IF: You have more than a spotting of blood in your stool.  Your belly is swollen (abdominal distention).  You are nauseated or vomiting.  You have a temperature over 101.  You have abdominal pain or discomfort that is severe or gets worse throughout the day.     1 polyp found and removed today  Further recommendations to follow pending review of pathology report  Office visit with us  in 3 months to reassess fatty liver.  Office will notify you.

## 2024-02-28 NOTE — Anesthesia Preprocedure Evaluation (Signed)
 Anesthesia Evaluation  Patient identified by MRN, date of birth, ID band Patient awake    Reviewed: Allergy & Precautions, H&P , NPO status , Patient's Chart, lab work & pertinent test results, reviewed documented beta blocker date and time   Airway Mallampati: II  TM Distance: >3 FB Neck ROM: full    Dental no notable dental hx.    Pulmonary neg pulmonary ROS   Pulmonary exam normal breath sounds clear to auscultation       Cardiovascular Exercise Tolerance: Good hypertension, negative cardio ROS  Rhythm:regular Rate:Normal     Neuro/Psych Seizures -,  PSYCHIATRIC DISORDERS      negative neurological ROS  negative psych ROS   GI/Hepatic negative GI ROS, Neg liver ROS,GERD  ,,  Endo/Other  negative endocrine ROS    Renal/GU negative Renal ROS  negative genitourinary   Musculoskeletal   Abdominal   Peds  Hematology negative hematology ROS (+) Blood dyscrasia, anemia   Anesthesia Other Findings   Reproductive/Obstetrics negative OB ROS                             Anesthesia Physical Anesthesia Plan  ASA: 2  Anesthesia Plan: General   Post-op Pain Management:    Induction:   PONV Risk Score and Plan: Propofol  infusion  Airway Management Planned:   Additional Equipment:   Intra-op Plan:   Post-operative Plan:   Informed Consent: I have reviewed the patients History and Physical, chart, labs and discussed the procedure including the risks, benefits and alternatives for the proposed anesthesia with the patient or authorized representative who has indicated his/her understanding and acceptance.     Dental Advisory Given  Plan Discussed with: CRNA  Anesthesia Plan Comments:        Anesthesia Quick Evaluation

## 2024-02-28 NOTE — Op Note (Signed)
 [facility] Patient Name: Cole Stewart Procedure Date: 02/28/2024 7:56 AM MRN: [id number] Date of Birth: [birth year] Attending MD: Gemma Kelp , MD, [id number] CSN: [id number] Age: 48 Admit Type: Outpatient Procedure:                Colonoscopy Indications:              High risk colon cancer surveillance: Personal                            history of colonic polyps Providers:                Gemma Kelp, MD, Willena Harp, [place]                            Wilson, Technician, Theola Fitch Referring MD:              Medicines:                Propofol  per Anesthesia Complications:            No immediate complications. Estimated Blood Loss:     Estimated blood loss was minimal. Procedure:                Pre-Anesthesia Assessment:                           - Prior to the procedure, a History and Physical                            was performed, and patient medications and                            allergies were reviewed. The patient's tolerance of                            previous anesthesia was also reviewed. The risks                            and benefits of the procedure and the sedation                            options and risks were discussed with the patient.                            All questions were answered, and informed consent                            was obtained. Prior Anticoagulants: The patient has                            taken no anticoagulant or antiplatelet agents. ASA                            Grade Assessment: II - A patient with mild systemic  disease. After reviewing the risks and benefits,                            the patient was deemed in satisfactory condition to                            undergo the procedure.                           After obtaining informed consent, the colonoscope                            was passed under direct vision. Throughout the                             procedure, the patient's blood pressure, pulse, and                            oxygen saturations were monitored continuously. The                            [id number]) scope was introduced through the                            anus and advanced to the the cecum, identified by                            appendiceal orifice and ileocecal valve. The                            colonoscopy was performed without difficulty. The                            patient tolerated the procedure well. The quality                            of the bowel preparation was adequate. The                            ileocecal valve, appendiceal orifice, and rectum                            were photographed. Scope In: 8:27:29 AM Scope Out: 8:41:11 AM Scope Withdrawal Time: 0 hours 9 minutes 3 seconds  Total Procedure Duration: 0 hours 13 minutes 42 seconds  Findings:      The perianal and digital rectal examinations were normal.      An 8 mm polyp was found in the descending colon. The polyp was sessile.       The polyp was removed with a cold snare. Resection and retrieval were       complete. Estimated blood loss was minimal.      The exam was otherwise without abnormality on direct and retroflexion       views. Impression:               -  One 8 mm polyp in the descending colon, removed                            with a cold snare. Resected and retrieved.                           - The examination was otherwise normal on direct                            and retroflexion views. Moderate Sedation:      Moderate (conscious) sedation was personally administered by an       anesthesia professional. The following parameters were monitored: oxygen       saturation, heart rate, blood pressure, respiratory rate, EKG, adequacy       of pulmonary ventilation, and response to care. Recommendation:           - Patient has a contact number available for                            emergencies. The signs and  symptoms of potential                            delayed complications were discussed with the                            patient. Return to normal activities tomorrow.                            Written discharge instructions were provided to the                            patient.                           - Advance diet as tolerated.                           - Continue present medications.                           - Repeat colonoscopy date to be determined after                            pending pathology results are reviewed for                            surveillance.                           - Return to GI office in 3 months. hx fatty liver Procedure Code(s):        --- Professional ---                           [phone], Colonoscopy, flexible; with removal of  tumor(s), polyp(s), or other lesion(s) by snare                            technique Diagnosis Code(s):        --- Professional ---                           Z86.010, Personal history of colonic polyps                           D12.4, Benign neoplasm of descending colon CPT copyright 2022 [employer] Association. All rights reserved. The codes documented in this report are preliminary and upon coder review may  be revised to meet current compliance requirements. Windsor Hatcher. Talya Quain, MD Gemma Kelp, MD 02/28/2024 8:54:22 AM This report has been signed electronically. Number of Addenda: 0

## 2024-02-28 NOTE — Transfer of Care (Signed)
 Immediate Anesthesia Transfer of Care Note  Patient: Cole Stewart  Procedure(s) Performed: COLONOSCOPY  Patient Location: Endoscopy Unit  Anesthesia Type:General  Level of Consciousness: drowsy and patient cooperative  Airway & Oxygen Therapy: Patient Spontanous Breathing and Patient connected to nasal cannula oxygen  Post-op Assessment: Report given to RN and Post -op Vital signs reviewed and stable  Post vital signs: Reviewed and stable  Last Vitals:  Vitals Value Taken Time  BP 104/54 02/28/24 0853  Temp 36.6 C 02/28/24 0848  Pulse 61 02/28/24 0853  Resp 15 02/28/24 0853  SpO2 100 % 02/28/24 0853    Last Pain:  Vitals:   02/28/24 0848  TempSrc: Oral  PainSc: 0-No pain         Complications: No notable events documented.

## 2024-02-28 NOTE — Anesthesia Postprocedure Evaluation (Signed)
 Anesthesia Post Note  Patient: Cole Stewart  Procedure(s) Performed: COLONOSCOPY  Patient location during evaluation: Phase II Anesthesia Type: General Level of consciousness: awake Pain management: pain level controlled Vital Signs Assessment: post-procedure vital signs reviewed and stable Respiratory status: spontaneous breathing and respiratory function stable Cardiovascular status: blood pressure returned to baseline and stable Postop Assessment: no headache and no apparent nausea or vomiting Anesthetic complications: no Comments: Late entry   No notable events documented.   Last Vitals:  Vitals:   02/28/24 0848 02/28/24 0853  BP: (!) 99/48 (!) 104/54  Pulse: 70 61  Resp: 16 15  Temp: 36.6 C   SpO2: 100% 100%    Last Pain:  Vitals:   02/28/24 0848  TempSrc: Oral  PainSc: 0-No pain                 Coretha Dew

## 2024-02-28 NOTE — H&P (Signed)
 @LOGO @   Primary Care Physician:  Theoplis Fix, MD Primary Gastroenterologist:  Dr. Riley Cheadle  Pre-Procedure History & Physical: HPI:  Cole Stewart is a 48 y.o. male here for for surveillance colonoscopy.  History of colonic adenomas removed previously.  Past Medical History:  Diagnosis Date   Anemia    GERD (gastroesophageal reflux disease)    Mental retardation    Mood disorder (HCC)    MVP (mitral valve prolapse)    Recurrent abdominal pain    Seizures (HCC)    last seizure was 2010.Unknown etiology   Thrombocytopenia (HCC)     Past Surgical History:  Procedure Laterality Date   CHOLECYSTECTOMY     COLONOSCOPY WITH PROPOFOL  N/A 12/05/2018   PROPOFOL ;  Surgeon: Suzette Espy, MD;  One 5 mm polyp in the cecum.  Tubular adenoma.  Due for repeat in 2025.   ESOPHAGOGASTRODUODENOSCOPY (EGD) WITH PROPOFOL  N/A 07/10/2019   PROPOFOL ;  Surgeon: Suzette Espy, MD; normal exam   POLYPECTOMY  12/05/2018   Procedure: POLYPECTOMY;  Surgeon: Suzette Espy, MD;  Location: [facility];  Service: Endoscopy;;  colon    Prior to Admission medications   Medication Sig Start Date End Date Taking? Authorizing Provider  chlorhexidine  (PERIDEX ) 0.12 % solution Use as directed 15 mLs in the mouth or throat 2 (two) times daily.    Yes [provider]  clonazePAM (KLONOPIN) 0.5 MG tablet Take 0.25-0.5 mg by mouth See admin instructions. Take 0.25 mg by mouth in the morning and take 0.5 mg at noon and at bedtime   Yes [provider]  cyanocobalamin  (VITAMIN B12) 1000 MCG tablet TAKE 1 TABLET BY MOUTH ONCE DAILY. 06/14/23  Yes Pennington, Rebekah M, PA-C  dicyclomine  (BENTYL ) 10 MG capsule TAKE 1 CAPSULE UP TO 3 TIMES DAILY DAILY AS NEEDED FOR ABDOMINAL PAIN OR DIARRHEA. HOLD IN THE SETTING OF CONSTIPATION. 08/25/21  Yes Delman Ferns, NP  divalproex (DEPAKOTE) 250 MG DR tablet Take 250-500 mg by mouth See admin instructions. Take 250 mg by mouth at 0700 and take 250 mg by mouth at  1200. Take 500 mg by mouth at bedtime 2000.   Yes [provider]  famotidine  (PEPCID ) 20 MG tablet TAKE 1 TABLET BY MOUTH AT BEDTIME. 07/07/20  Yes Delman Ferns, NP  gabapentin (NEURONTIN) 400 MG capsule Take 400-1,200 mg by mouth See admin instructions. Take 400 mg by mouth in the morning and take 1200 mg by mouth at bedtime   Yes [provider]  Iloperidone 6 MG TABS Take 6 mg by mouth daily.    Yes [provider]  ketoconazole (NIZORAL) 2 % shampoo Apply 1 application topically 2 (two) times a week. *Lather, leave for 5 minutes, then rinse*   Yes [provider]  lisinopril (ZESTRIL) 20 MG tablet Take 20 mg by mouth daily. 05/08/23  Yes [provider]  Olopatadine HCl 0.2 % SOLN Place 1 drop into both eyes daily.    Yes [provider]  Omega-3 Fatty Acids (FISH OIL OMEGA-3) 1000 MG CAPS Take 2,000 mg by mouth daily.   Yes [provider]  QUEtiapine (SEROQUEL XR) 300 MG 24 hr tablet Take 300 mg by mouth at bedtime. 10/16/22  Yes [provider]  Vitamin D, Ergocalciferol, (DRISDOL) 1.25 MG (50000 UT) CAPS capsule Take 50,000 Units by mouth every 30 (thirty) days.   Yes [provider]  vitamin E 200 UNIT capsule Take 200 Units by mouth every morning.  Yes [provider]  ACETAMINOPHEN  EXTRA STRENGTH 500 MG tablet Take by mouth. 03/07/21   [provider]  ANTACID 200-200-20 MG/5ML suspension Take by mouth. 03/07/21   [provider]  BANOPHEN 25 MG capsule Take 25 mg by mouth at bedtime. 03/07/21   [provider]  bisacodyl  5 MG EC tablet Take 1 tablet (5 mg total) by mouth daily as needed. 02/27/24   Dilcia Rybarczyk, Windsor Hatcher, MD  dexlansoprazole  (DEXILANT ) 60 MG capsule TAKE 1 CAPSULE BY MOUTH ONCE A DAY. 07/05/22   Tanique Matney, Windsor Hatcher, MD  docusate sodium (COLACE) 100 MG capsule Take 100 mg by mouth 2 (two) times daily. 03/07/21   [provider]  doxepin (SINEQUAN) 10 MG capsule Take 10  mg by mouth at bedtime.    [provider]  FLUBLOK 0.5 ML SOSY  09/20/23   [provider]  gabapentin (NEURONTIN) 600 MG tablet  03/30/20   [provider]  hydrocortisone cream 1 % Apply topically. 03/07/21   [provider]  MILK OF MAGNESIA 1200 MG/15ML suspension SMARTSIG:Milliliter(s) By Mouth 03/07/21   [provider]  Neomycin-Bacitracin-Polymyxin (TRIPLE ANTIBIOTIC) [phone] OINT Apply topically. 03/07/21   [provider]  ondansetron  (ZOFRAN ) 4 MG tablet Take 4 mg by mouth at bedtime. 10/18/20   [provider]  polyethylene glycol powder (MIRALAX ) 17 GM/SCOOP powder Take 17 g by mouth daily as needed for mild constipation. 08/28/19   Evander Hills, PA-C  promethazine  (PHENERGAN ) 25 MG tablet Take by mouth. 03/02/22   [provider]  simethicone  (SIMETHICONE  DROPS INFANTS) 40 MG/0.6ML drops Take 0.6 mLs (40 mg total) by mouth 4 (four) times daily as needed for flatulence. 02/27/24   Suzette Espy, MD  [facility] syringe  09/20/23   [provider]  TUSSIN DM 100-10 MG/5ML liquid Take by mouth. 03/07/21   [provider]  zolpidem (AMBIEN) 5 MG tablet Take 5 mg by mouth at bedtime.     [provider]    Allergies as of 01/16/2024   (No Known Allergies)    Family History  Family history unknown: Yes    Social History   Socioeconomic History   Marital status: Single    Spouse name: Not on file   Number of children: 0   Years of education: Not on file   Highest education level: Not on file  Occupational History   Not on file  Tobacco Use   Smoking status: Never   Smokeless tobacco: Never  Vaping Use   Vaping status: Never Used  Substance and Sexual Activity   Alcohol  use: No   Drug use: No   Sexual activity: Never  Other Topics Concern   Not on file  Social History Narrative   Not on file   Social Drivers of Health   Financial Resource Strain: Not on file  Food  Insecurity: Not on file  Transportation Needs: Not on file  Physical Activity: Not on file  Stress: Not on file  Social Connections: Not on file  Intimate Partner Violence: Not on file    Review of Systems: See HPI, otherwise negative ROS  Physical Exam: BP 123/60   Pulse 86   Temp 97.9 F (36.6 C) (Oral)   Resp 15   Ht 5\' 7"  (1.702 m)   Wt 68 kg   SpO2 97%   BMI 23.49 kg/m  General:   Alert,  Well-developed, well-nourished, pleasant and cooperative in NAD Neck:  Supple; no masses or  thyromegaly. No significant cervical adenopathy. Lungs:  Clear throughout to auscultation.   No wheezes, crackles, or rhonchi. No acute distress. Heart:  Regular rate and rhythm; no murmurs, clicks, rubs,  or gallops. Abdomen: Non-distended, normal bowel sounds.  Soft and nontender without appreciable mass or hepatosplenomegaly.   Impression/Plan: 48 year old gentleman with history of colonic adenoma here for surveillance colonoscopy The risks, benefits, limitations, alternatives and imponderables have been reviewed with the patient. Questions have been answered. All parties are agreeable.       Notice: This dictation was prepared with Dragon dictation along with smaller phrase technology. Any transcriptional errors that result from this process are unintentional and may not be corrected upon review.

## 2024-02-29 ENCOUNTER — Encounter (HOSPITAL_COMMUNITY): Payer: Self-pay | Admitting: Internal Medicine

## 2024-03-03 LAB — SURGICAL PATHOLOGY

## 2024-03-08 ENCOUNTER — Encounter: Payer: Self-pay | Admitting: Internal Medicine

## 2024-03-25 ENCOUNTER — Other Ambulatory Visit: Payer: Self-pay | Admitting: Internal Medicine

## 2024-04-14 ENCOUNTER — Encounter: Payer: Self-pay | Admitting: Internal Medicine

## 2024-05-22 DIAGNOSIS — Z79899 Other long term (current) drug therapy: Secondary | ICD-10-CM | POA: Diagnosis not present

## 2024-05-22 DIAGNOSIS — I10 Essential (primary) hypertension: Secondary | ICD-10-CM | POA: Diagnosis not present

## 2024-05-22 DIAGNOSIS — E559 Vitamin D deficiency, unspecified: Secondary | ICD-10-CM | POA: Diagnosis not present

## 2024-05-26 ENCOUNTER — Inpatient Hospital Stay: Attending: Hematology

## 2024-05-26 DIAGNOSIS — D509 Iron deficiency anemia, unspecified: Secondary | ICD-10-CM | POA: Diagnosis not present

## 2024-05-26 DIAGNOSIS — Z79899 Other long term (current) drug therapy: Secondary | ICD-10-CM | POA: Diagnosis not present

## 2024-05-26 DIAGNOSIS — D696 Thrombocytopenia, unspecified: Secondary | ICD-10-CM | POA: Insufficient documentation

## 2024-05-26 DIAGNOSIS — E538 Deficiency of other specified B group vitamins: Secondary | ICD-10-CM | POA: Insufficient documentation

## 2024-05-26 DIAGNOSIS — D563 Thalassemia minor: Secondary | ICD-10-CM | POA: Insufficient documentation

## 2024-05-26 LAB — CBC WITH DIFFERENTIAL/PLATELET
Abs Immature Granulocytes: 0.02 K/uL (ref 0.00–0.07)
Basophils Absolute: 0 K/uL (ref 0.0–0.1)
Basophils Relative: 0 %
Eosinophils Absolute: 0.2 K/uL (ref 0.0–0.5)
Eosinophils Relative: 3 %
HCT: 40.5 % (ref 39.0–52.0)
Hemoglobin: 11.7 g/dL — ABNORMAL LOW (ref 13.0–17.0)
Immature Granulocytes: 0 %
Lymphocytes Relative: 50 %
Lymphs Abs: 3.5 K/uL (ref 0.7–4.0)
MCH: 20.5 pg — ABNORMAL LOW (ref 26.0–34.0)
MCHC: 28.9 g/dL — ABNORMAL LOW (ref 30.0–36.0)
MCV: 71.1 fL — ABNORMAL LOW (ref 80.0–100.0)
Monocytes Absolute: 0.4 K/uL (ref 0.1–1.0)
Monocytes Relative: 5 %
Neutro Abs: 3.1 K/uL (ref 1.7–7.7)
Neutrophils Relative %: 42 %
Platelets: UNDETERMINED K/uL (ref 150–400)
RBC: 5.7 MIL/uL (ref 4.22–5.81)
RDW: 19.6 % — ABNORMAL HIGH (ref 11.5–15.5)
WBC: 7.2 K/uL (ref 4.0–10.5)
nRBC: 0.3 % — ABNORMAL HIGH (ref 0.0–0.2)

## 2024-05-26 LAB — COMPREHENSIVE METABOLIC PANEL WITH GFR
ALT: 22 U/L (ref 0–44)
AST: 18 U/L (ref 15–41)
Albumin: 4 g/dL (ref 3.5–5.0)
Alkaline Phosphatase: 64 U/L (ref 38–126)
Anion gap: 9 (ref 5–15)
BUN: 15 mg/dL (ref 6–20)
CO2: 27 mmol/L (ref 22–32)
Calcium: 9 mg/dL (ref 8.9–10.3)
Chloride: 103 mmol/L (ref 98–111)
Creatinine, Ser: 0.65 mg/dL (ref 0.61–1.24)
GFR, Estimated: >60 mL/min (ref >60)
Glucose, Bld: 74 mg/dL (ref 70–99)
Potassium: 3.4 mmol/L — ABNORMAL LOW (ref 3.5–5.1)
Sodium: 139 mmol/L (ref 135–145)
Total Bilirubin: 1.4 mg/dL — ABNORMAL HIGH (ref 0.0–1.2)
Total Protein: 7.7 g/dL (ref 6.5–8.1)

## 2024-05-26 LAB — IRON AND TIBC
Iron: 210 ug/dL — ABNORMAL HIGH (ref 45–182)
Saturation Ratios: 74 % — ABNORMAL HIGH (ref 17.9–39.5)
TIBC: 284 ug/dL (ref 250–450)
UIBC: 74 ug/dL

## 2024-05-26 LAB — FERRITIN: Ferritin: 791 ng/mL — ABNORMAL HIGH (ref 24–336)

## 2024-05-26 LAB — VITAMIN B12: Vitamin B-12: 823 pg/mL (ref 180–914)

## 2024-05-28 ENCOUNTER — Inpatient Hospital Stay: Payer: 59

## 2024-05-28 LAB — METHYLMALONIC ACID, SERUM: Methylmalonic Acid, Quantitative: 107 nmol/L (ref 0–378)

## 2024-05-29 ENCOUNTER — Ambulatory Visit: Admitting: Gastroenterology

## 2024-05-31 NOTE — Progress Notes (Unsigned)
 Referring Provider: Maree Isles, MD Primary Care Physician:  Maree Isles, MD Primary GI Physician: Dr. Shaaron  Chief Complaint  Patient presents with   Follow-up    Follow up. No Problems     HPI:   Cole Stewart is a 48 y.o. male who who has intellectual disability, GI history significant for cholecystectomy, GERD, chronic abdominal pain at least since 2019 without clear etiology, colonic adenoma. 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 He is presenting today for follow-up.    Today: Presets today with one of his care takers, Sherard. Reports no concerns. Unable to obtain any meaningful history from the patient due to intellectual disability, inability to effectively communicate/speak.  Sherard reports patient has been doing well.  No complaints of nausea, vomiting.  Seems to be eating well.  No complaints of abdominal pain.  Bowels seem to be moving well without blood in the stool or black stool.  History of fatty liver:  No history of LFT elevation.  Most recent labs 05/26/2024 with LFTs within normal limits.  Total bilirubin slightly elevated at 1.4. Platelets are chronically low. Recent labs without platelet calculation due to platelet clumping.  Last platelets on file was 117 on 05/08/2023.    Colonoscopy 02/24/2024: 8 mm polyp in the descending colon resected and retrieved, otherwise normal exam.  Pathology showed tubular adenoma.  Recommended 7-year surveillance.   Past Medical History:  Diagnosis Date   Anemia    GERD (gastroesophageal reflux disease)    Mental retardation    Mood disorder (HCC)    MVP (mitral valve prolapse)    Recurrent abdominal pain    Seizures (HCC)    last seizure was 2010.Unknown etiology   Thrombocytopenia (HCC)     Past Surgical History:  Procedure Laterality Date   CHOLECYSTECTOMY     COLONOSCOPY N/A  02/28/2024   Procedure: COLONOSCOPY;  Surgeon: Shaaron Lamar HERO, MD;  Location: [facility];  Service: Endoscopy;  Laterality: N/A;  8:15 am, ok for room 1/2   COLONOSCOPY WITH PROPOFOL  N/A 12/05/2018   PROPOFOL ;  Surgeon: Shaaron Lamar HERO, MD;  One 5 mm polyp in the cecum.  Tubular adenoma.  Due for repeat in 2025.   ESOPHAGOGASTRODUODENOSCOPY (EGD) WITH PROPOFOL  N/A 07/10/2019   PROPOFOL ;  Surgeon: Shaaron Lamar HERO, MD; normal exam   POLYPECTOMY  12/05/2018   Procedure: POLYPECTOMY;  Surgeon: Shaaron Lamar HERO, MD;  Location: [facility];  Service: Endoscopy;;  colon    Current Outpatient Medications  Medication Sig Dispense Refill   chlorhexidine  (PERIDEX ) 0.12 % solution Use as directed 15 mLs in the mouth or throat 2 (two) times daily.      clonazePAM (KLONOPIN) 0.5 MG tablet Take 0.25-0.5 mg by mouth See admin instructions. Take 0.25 mg by mouth in the morning and take 0.5 mg at noon and at bedtime     cyanocobalamin  (VITAMIN B12) 1000 MCG tablet TAKE 1 TABLET BY MOUTH ONCE DAILY. 30 tablet 11   dexlansoprazole  (DEXILANT ) 60 MG capsule TAKE 1 CAPSULE BY MOUTH ONCE A DAY. 30 capsule 2   dicyclomine  (BENTYL ) 10 MG capsule TAKE 1 CAPSULE UP TO 3 TIMES DAILY DAILY AS NEEDED FOR ABDOMINAL PAIN OR DIARRHEA. HOLD IN THE SETTING OF CONSTIPATION. (Patient taking differently: Take 10 mg by mouth in the morning and at bedtime. TAKE 1 CAPSULE UP TO 3 TIMES DAILY DAILY AS NEEDED FOR ABDOMINAL PAIN  OR DIARRHEA. HOLD IN THE SETTING OF CONSTIPATION.) 30 capsule 3   divalproex (DEPAKOTE) 250 MG DR tablet Take 250-500 mg by mouth See admin instructions. Take 250 mg by mouth at 0700 and take 250 mg by mouth at 1200. Take 500 mg by mouth at bedtime 2000.     famotidine  (PEPCID ) 20 MG tablet TAKE 1 TABLET BY MOUTH AT BEDTIME. 30 tablet 11   gabapentin (NEURONTIN) 400 MG capsule Take 400-1,200 mg by mouth See admin instructions. Take 400 mg by mouth in the morning and take 1200 mg by mouth at bedtime     ketoconazole  (NIZORAL) 2 % shampoo Apply 1 application topically 2 (two) times a week. *Lather, leave for 5 minutes, then rinse*     lisinopril (ZESTRIL) 20 MG tablet Take 20 mg by mouth daily.     Olopatadine HCl 0.2 % SOLN Place 1 drop into both eyes daily.      Omega-3 Fatty Acids (FISH OIL OMEGA-3) 1000 MG CAPS Take 2,000 mg by mouth daily.     QUEtiapine (SEROQUEL XR) 200 MG 24 hr tablet Take 200 mg by mouth at bedtime.     Vitamin D, Ergocalciferol, (DRISDOL) 1.25 MG (50000 UT) CAPS capsule Take 50,000 Units by mouth every 30 (thirty) days.     vitamin E 200 UNIT capsule Take 200 Units by mouth every morning.     FLUBLOK 0.5 ML SOSY  (Patient not taking: Reported on 06/02/2024)     hydrocortisone cream 1 % Apply topically. (Patient not taking: Reported on 06/02/2024)     SPIKEVAX syringe  (Patient not taking: Reported on 06/02/2024)     No current facility-administered medications for this visit.    Allergies as of 06/02/2024   (No Known Allergies)    Family History  Family history unknown: Yes    Social History   Socioeconomic History   Marital status: Single    Spouse name: Not on file   Number of children: 0   Years of education: Not on file   Highest education level: Not on file  Occupational History   Not on file  Tobacco Use   Smoking status: Never   Smokeless tobacco: Never  Vaping Use   Vaping status: Never Used  Substance and Sexual Activity   Alcohol  use: No   Drug use: No   Sexual activity: Never  Other Topics Concern   Not on file  Social History Narrative   Not on file   Social Drivers of Health   Financial Resource Strain: Not on file  Food Insecurity: Not on file  Transportation Needs: Not on file  Physical Activity: Not on file  Stress: Not on file  Social Connections: Not on file    Review of Systems: See HPI.  Physical Exam: BP 112/68 (BP Location: Left Arm, Patient Position: Sitting, Cuff Size: Normal)   Pulse 89   Temp 97.7 F (36.5 C)  (Temporal)   Wt 146 lb 9.6 oz (66.5 kg)   BMI 22.96 kg/m  General:   Alert.  Well-developed, well-nourished.  No distress noted. Pleasant and cooperative.  Head:  Normocephalic and atraumatic. Eyes:  Conjuctiva clear without scleral icterus. Heart:  S1, S2 present without murmurs appreciated. Lungs:  Clear to auscultation bilaterally. No wheezes, rales, or rhonchi. No distress.  Abdomen:  +BS, soft, non-tender and non-distended. No rebound or guarding. No HSM or masses noted. Msk:  Symmetrical without gross deformities. Normal posture. Extremities:  Without edema. Neurologic:  Alert and  oriented x4 Psych:  Normal mood and affect.    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  GERD: Seems to be well-controlled on Dexilant  60 mg daily and Pepcid  20 mg at bedtime.  Fatty liver: Has been noted to have fatty liver on prior abdominal imaging, last imaging was ultrasound in April 2024.  No hepatic nodularity.  LFTs have been within normal limits with most recent labs 05/26/2024.  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  Platelets are chronically low.  Unfortunately, on his most recent labs, platelets have not been able to be calculated due to platelet clumping.  Last platelets on file were 117 in July 2024.  Based on most recent LFTs and platelets in July 2024, fib 4 is 1.04 and excludes advanced fibrosis; however, we will try to repeat CBC for platelets and recalculate fib 4.  If platelets are not able to be calculated, would consider sending patient for FibroScan to re-evaluate for any degree of fibrosis.   Elevated bilirubin: Slight elevation of total bilirubin at  1.4 on 05/26/2024.  This may be secondary to Gilbert's syndrome.  Will fractionate his bilirubin.   Plan:  CBC, fractionated bilirubin.  Re-calculate FIB-4 based on updated platelets. If platelets are clumped, would send for fibroscan in [place] to assess for fibrosis.  Continue Dexilant  60 mg daily. Continue Pepcid  20 mg at bedtime. Follow-up in 6 months.   Josette Centers, PA-C [facility] 06/02/2024

## 2024-06-02 ENCOUNTER — Ambulatory Visit (INDEPENDENT_AMBULATORY_CARE_PROVIDER_SITE_OTHER): Admitting: Gastroenterology

## 2024-06-02 ENCOUNTER — Encounter: Payer: Self-pay | Admitting: Gastroenterology

## 2024-06-02 VITALS — BP 112/68 | HR 89 | Temp 97.7°F | Wt 146.6 lb

## 2024-06-02 DIAGNOSIS — K760 Fatty (change of) liver, not elsewhere classified: Secondary | ICD-10-CM | POA: Diagnosis not present

## 2024-06-02 DIAGNOSIS — K219 Gastro-esophageal reflux disease without esophagitis: Secondary | ICD-10-CM

## 2024-06-02 DIAGNOSIS — R17 Unspecified jaundice: Secondary | ICD-10-CM

## 2024-06-02 NOTE — Patient Instructions (Signed)
 Please have blood work completed including CBC and fractionated bilirubin and fax results back to our office.  Continue Dexilant  60 mg daily and Pepcid  20 mg at bedtime.  Follow-up in 6 months or sooner if needed.  Josette Centers, PA-C [facility]

## 2024-06-03 NOTE — Progress Notes (Unsigned)
 [facility] 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  He was last seen by NP Delon Hope on 11/29/2023.  He presents today with his caregiver.***   Due to underlying cognitive deficit, patient is poor historian.   ***Caregiver/group home staff member is not aware of any changes in patient's health status.  No abnormal bleeding, bruising, or petechial rash.  ***No fever, chills, night sweats, unintentional weight loss.   Patient has not been taking iron supplements since it was discontinued at his visit in August 2023.***  Patient is taking vitamin B12 500 mcg daily***  He has 100***% energy and 100***% appetite. He endorses that he is maintaining a stable weight.  ASSESSMENT & PLAN:  1.  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 - No history of bright red blood per rectum or melena.*** - Colonoscopy on 12/05/2018 shows one 5 mm polyp in the cecum.  Otherwise normal exam.  Pathology showed tubular adenoma. - EGD on 07/10/2019 shows normal esophagus, stomach, normal duodenal bulb and second part of duodenum. - Hemoglobin electrophoresis consistent with beta thalassemia minor - Most recent labs (05/26/2024): Hgb 11.7/MCV 71.1*** - PLAN: Anemia is  stable and secondary to beta thalassemia minor. - Treatment of iron overload as below.  2.  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  Iron supplement was stopped in August 2023. - Labs (11/02/2022): Ferritin 841, iron saturation 67% with serum iron 212 - Labs (05/08/2023): Ferritin 752, iron saturation 46%.   - Most recent labs (05/26/2024): Ferritin 791, iron saturation 74%.  LFTs normal. - PLAN: We will continue close monitoring.  If ferritin >1000, will check MRI liver to see if patient would benefit from iron reduction therapy.  3.  Thrombocytopenia with vitamin B12 deficiency - He has mild to moderate thrombocytopenia since 2009. - CT scan of abdomen December 2019 showed hepatic steatosis.  Craniocaudal spleen size is measured at 13 cm, slightly more than upper limit of normal.   - Previous work-up (September 2020) has shown negative hepatitis B and hepatitis C.  H. pylori IgA level elevated, with normal IgG (nonspecific).  He had negative ANA and rheumatoid factor. - CBC from 06/09/2022 showed platelets lower than normal at 61.  Repeat CBC (08/28/2022) showed persistently low platelets 59. - Additional workup of worsening thrombocytopenia (11/02/2022):  SPEP negative.  Immunofixation normal.  Mildly elevated kappa 32.6, mildly elevated lambda  27.8, normal free light chain ratio 1.17. Immature platelet fraction is elevated at 15.9% (consistent with peripheral platelet destruction or sequestration) Nutritional panel: Marginal B12 276,  normal MMA.  Normal folate and homocystine.  Normal copper .  - Abdominal ultrasound (02/26/2023): Question of fatty infiltration of liver (coarsened increased echogenicity which can be seen with fatty liver disease, although also can be seen with cirrhosis and some infiltrative disorders, but there is no definite hepatic mass or nodularity and patent vein is patent).  Spleen grossly normal appearance, 11.6 cm length. - Patient is taking vitamin B12 1000 mcg daily - Most recent labs (05/26/2024): PLATELET CLUMPING.  Normal WBC/differential.  Normal B12 823/normal MMA. - No bleeding history including nosebleeds, hematuria reported.  No easy bruising.*** - DIFFERENTIAL DIAGNOSIS favors immune mediated thrombocytopenia, versus pseudothrombocytopenia (platelet clumping), versus splenomegaly/fatty liver disease. - PLAN: Continue vitamin B12 1000 mcg daily.  - Repeat CBC, B12, MMA with office visit in 6 months     PLAN SUMMARY:*** >> Labs in 6 months = CBC/D, CMP, B12, MMA, ferritin, iron/TIBC >> OFFICE visit in 6 months (1 week after labs)     REVIEW OF SYSTEMS: ***  Review of Systems  Constitutional:  Negative for appetite change, chills, diaphoresis, fatigue, fever and unexpected weight change.  HENT:   Negative for lump/mass and nosebleeds.   Eyes:  Negative for eye problems.  Respiratory:  Negative for cough, hemoptysis and shortness of breath.   Cardiovascular:  Negative for chest pain, leg swelling and palpitations.  Gastrointestinal:  Negative for abdominal pain, blood in stool, constipation, diarrhea, nausea and vomiting.  Genitourinary:  Negative for hematuria.   Skin: Negative.   Neurological:  Negative for dizziness, headaches and light-headedness.  Hematological:  Does not bruise/bleed easily.     PHYSICAL EXAM:  ECOG PERFORMANCE STATUS: 1 - Symptomatic but completely ambulatory *** There were no vitals filed for this visit. There were no vitals filed for this visit. Physical  Exam Constitutional:      Appearance: Normal appearance. He is obese.  HENT:     Head: Normocephalic and atraumatic.     Mouth/Throat:     Mouth: Mucous membranes are moist.  Eyes:     Extraocular Movements: Extraocular movements intact.     Pupils: Pupils are equal, round, and reactive to light.  Cardiovascular:     Rate and Rhythm: Normal rate and regular rhythm.     Pulses: Normal pulses.     Heart sounds: Normal heart sounds.  Pulmonary:     Effort: Pulmonary effort is normal.     Breath sounds: Normal breath sounds.  Abdominal:     General: Bowel sounds are normal.     Palpations: Abdomen is soft.     Tenderness: There is no abdominal tenderness.  Musculoskeletal:        General: No swelling.     Right lower leg: No edema.     Left lower leg: No edema.  Lymphadenopathy:     Cervical: No cervical adenopathy.  Skin:    General: Skin is warm and dry.  Neurological:     Mental Status: He is alert. Mental status is at baseline. He is disoriented.  Psychiatric:        Mood and Affect: Mood normal.        Behavior: Behavior normal.     PAST MEDICAL/SURGICAL HISTORY:  Past Medical History:  Diagnosis Date   Anemia    GERD (gastroesophageal reflux disease)    Mental retardation  Mood disorder (HCC)    MVP (mitral valve prolapse)    Recurrent abdominal pain    Seizures (HCC)    last seizure was 2010.Unknown etiology   Thrombocytopenia (HCC)    Past Surgical History:  Procedure Laterality Date   CHOLECYSTECTOMY     COLONOSCOPY N/A 02/28/2024   Procedure: COLONOSCOPY;  Surgeon: Shaaron Lamar HERO, MD;  Location: [facility];  Service: Endoscopy;  Laterality: N/A;  8:15 am, ok for room 1/2   COLONOSCOPY WITH PROPOFOL  N/A 12/05/2018   PROPOFOL ;  Surgeon: Shaaron Lamar HERO, MD;  One 5 mm polyp in the cecum.  Tubular adenoma.  Due for repeat in 2025.   ESOPHAGOGASTRODUODENOSCOPY (EGD) WITH PROPOFOL  N/A 07/10/2019   PROPOFOL ;  Surgeon: Shaaron Lamar HERO, MD; normal exam    POLYPECTOMY  12/05/2018   Procedure: POLYPECTOMY;  Surgeon: Shaaron Lamar HERO, MD;  Location: [facility];  Service: Endoscopy;;  colon    SOCIAL HISTORY:  Social History   Socioeconomic History   Marital status: Single    Spouse name: Not on file   Number of children: 0   Years of education: Not on file   Highest education level: Not on file  Occupational History   Not on file  Tobacco Use   Smoking status: Never   Smokeless tobacco: Never  Vaping Use   Vaping status: Never Used  Substance and Sexual Activity   Alcohol  use: No   Drug use: No   Sexual activity: Never  Other Topics Concern   Not on file  Social History Narrative   Not on file   Social Drivers of Health   Financial Resource Strain: Not on file  Food Insecurity: Not on file  Transportation Needs: Not on file  Physical Activity: Not on file  Stress: Not on file  Social Connections: Not on file  Intimate Partner Violence: Not on file    FAMILY HISTORY:  Family History  Family history unknown: Yes    CURRENT MEDICATIONS:  Outpatient Encounter Medications as of 06/04/2024  Medication Sig Note   chlorhexidine  (PERIDEX ) 0.12 % solution Use as directed 15 mLs in the mouth or throat 2 (two) times daily.     clonazePAM (KLONOPIN) 0.5 MG tablet Take 0.25-0.5 mg by mouth See admin instructions. Take 0.25 mg by mouth in the morning and take 0.5 mg at noon and at bedtime    cyanocobalamin  (VITAMIN B12) 1000 MCG tablet TAKE 1 TABLET BY MOUTH ONCE DAILY.    dexlansoprazole  (DEXILANT ) 60 MG capsule TAKE 1 CAPSULE BY MOUTH ONCE A DAY. 02/28/2024: Not taking   dicyclomine  (BENTYL ) 10 MG capsule TAKE 1 CAPSULE UP TO 3 TIMES DAILY DAILY AS NEEDED FOR ABDOMINAL PAIN OR DIARRHEA. HOLD IN THE SETTING OF CONSTIPATION. (Patient taking differently: Take 10 mg by mouth in the morning and at bedtime. TAKE 1 CAPSULE UP TO 3 TIMES DAILY DAILY AS NEEDED FOR ABDOMINAL PAIN OR DIARRHEA. HOLD IN THE SETTING OF CONSTIPATION.)     divalproex (DEPAKOTE) 250 MG DR tablet Take 250-500 mg by mouth See admin instructions. Take 250 mg by mouth at 0700 and take 250 mg by mouth at 1200. Take 500 mg by mouth at bedtime 2000.    famotidine  (PEPCID ) 20 MG tablet TAKE 1 TABLET BY MOUTH AT BEDTIME.    FLUBLOK 0.5 ML SOSY  (Patient not taking: Reported on 06/02/2024)    gabapentin (NEURONTIN) 400 MG capsule Take 400-1,200 mg by mouth See admin instructions. Take 400 mg by mouth in  the morning and take 1200 mg by mouth at bedtime    hydrocortisone cream 1 % Apply topically. (Patient not taking: Reported on 06/02/2024)    ketoconazole (NIZORAL) 2 % shampoo Apply 1 application topically 2 (two) times a week. *Lather, leave for 5 minutes, then rinse*    lisinopril (ZESTRIL) 20 MG tablet Take 20 mg by mouth daily.    Olopatadine HCl 0.2 % SOLN Place 1 drop into both eyes daily.     Omega-3 Fatty Acids (FISH OIL OMEGA-3) 1000 MG CAPS Take 2,000 mg by mouth daily.    QUEtiapine (SEROQUEL XR) 200 MG 24 hr tablet Take 200 mg by mouth at bedtime.    SPIKEVAX syringe  (Patient not taking: Reported on 06/02/2024)    Vitamin D, Ergocalciferol, (DRISDOL) 1.25 MG (50000 UT) CAPS capsule Take 50,000 Units by mouth every 30 (thirty) days.    vitamin E 200 UNIT capsule Take 200 Units by mouth every morning.    [DISCONTINUED] docusate sodium (COLACE) 100 MG capsule Take 100 mg by mouth 2 (two) times daily. (Patient not taking: Reported on 06/02/2024)    [DISCONTINUED] doxepin (SINEQUAN) 10 MG capsule Take 10 mg by mouth at bedtime. (Patient not taking: Reported on 06/02/2024)    [DISCONTINUED] Iloperidone 6 MG TABS Take 6 mg by mouth daily.  (Patient not taking: Reported on 06/02/2024)    [DISCONTINUED] MILK OF MAGNESIA 1200 MG/15ML suspension SMARTSIG:Milliliter(s) By Mouth (Patient not taking: Reported on 06/02/2024)    [DISCONTINUED] Neomycin-Bacitracin-Polymyxin (TRIPLE ANTIBIOTIC) 3.5-913 065 9672 OINT Apply topically. (Patient not taking: Reported on 06/02/2024)  02/28/2024: Not taking   [DISCONTINUED] ondansetron  (ZOFRAN ) 4 MG tablet Take 4 mg by mouth at bedtime.    [DISCONTINUED] polyethylene glycol powder (MIRALAX ) 17 GM/SCOOP powder Take 17 g by mouth daily as needed for mild constipation.    [DISCONTINUED] promethazine  (PHENERGAN ) 25 MG tablet Take by mouth.    [DISCONTINUED] QUEtiapine (SEROQUEL XR) 300 MG 24 hr tablet Take 300 mg by mouth at bedtime.    [DISCONTINUED] simethicone  (SIMETHICONE  DROPS INFANTS) 40 MG/0.6ML drops Take 0.6 mLs (40 mg total) by mouth 4 (four) times daily as needed for flatulence.    [DISCONTINUED] TUSSIN DM 100-10 MG/5ML liquid Take by mouth. (Patient not taking: Reported on 06/02/2024) 02/28/2024: Not taking   [DISCONTINUED] zolpidem (AMBIEN) 5 MG tablet Take 5 mg by mouth at bedtime.  (Patient not taking: Reported on 06/02/2024) 02/28/2024: Not taking   No facility-administered encounter medications on file as of 06/04/2024.    ALLERGIES:  No Known Allergies  LABORATORY DATA:  I have reviewed the labs as listed.  CBC    Component Value Date/Time   WBC 7.2 05/26/2024 0842   RBC 5.70 05/26/2024 0842   HGB 11.7 (L) 05/26/2024 0842   HCT 40.5 05/26/2024 0842   PLT PLATELET CLUMPS NOTED ON SMEAR, UNABLE TO ESTIMATE 05/26/2024 0842   MCV 71.1 (L) 05/26/2024 0842   MCH 20.5 (L) 05/26/2024 0842   MCHC 28.9 (L) 05/26/2024 0842   RDW 19.6 (H) 05/26/2024 0842   LYMPHSABS 3.5 05/26/2024 0842   MONOABS 0.4 05/26/2024 0842   EOSABS 0.2 05/26/2024 0842   BASOSABS 0.0 05/26/2024 0842      Latest Ref Rng & Units 05/26/2024    8:42 AM 11/13/2023    1:52 PM 05/08/2023    3:07 PM  CMP  Glucose 70 - 99 mg/dL 74  885  91   BUN 6 - 20 mg/dL 15  18  13    Creatinine 0.61 - 1.24  mg/dL 9.34  9.42  9.25   Sodium 135 - 145 mmol/L 139  138  136   Potassium 3.5 - 5.1 mmol/L 3.4  3.9  4.4   Chloride 98 - 111 mmol/L 103  105  102   CO2 22 - 32 mmol/L 27  25  25    Calcium 8.9 - 10.3 mg/dL 9.0  9.2  8.8   Total Protein 6.5 - 8.1 g/dL  7.7  7.2  7.0   Total Bilirubin 0.0 - 1.2 mg/dL 1.4  1.0  0.6   Alkaline Phos 38 - 126 U/L 64  60  69   AST 15 - 41 U/L 18  20  18    ALT 0 - 44 U/L 22  21  21      DIAGNOSTIC IMAGING:  I have independently reviewed the relevant imaging and discussed with the patient.   WRAP UP:  All questions were answered. The patient knows to call the clinic with any problems, questions or concerns.  Medical decision making: Moderate***  Time spent on visit: I spent 20 minutes counseling the patient face to face. The total time spent in the appointment was 30 minutes and more than 50% was on counseling.  Pleasant CHRISTELLA Barefoot, PA-C  ***

## 2024-06-04 ENCOUNTER — Inpatient Hospital Stay (HOSPITAL_BASED_OUTPATIENT_CLINIC_OR_DEPARTMENT_OTHER): Payer: 59 | Admitting: Physician Assistant

## 2024-06-04 DIAGNOSIS — D509 Iron deficiency anemia, unspecified: Secondary | ICD-10-CM | POA: Diagnosis not present

## 2024-06-04 DIAGNOSIS — D696 Thrombocytopenia, unspecified: Secondary | ICD-10-CM | POA: Diagnosis not present

## 2024-06-04 DIAGNOSIS — Z79899 Other long term (current) drug therapy: Secondary | ICD-10-CM | POA: Diagnosis not present

## 2024-06-04 DIAGNOSIS — D563 Thalassemia minor: Secondary | ICD-10-CM | POA: Diagnosis not present

## 2024-06-04 DIAGNOSIS — E538 Deficiency of other specified B group vitamins: Secondary | ICD-10-CM | POA: Diagnosis not present

## 2024-06-04 NOTE — Patient Instructions (Signed)
 [facility] at [facility] **VISIT SUMMARY & IMPORTANT INSTRUCTIONS **   You were seen today by Pleasant Barefoot PA-C for your anemia and low platelets.    ANEMIA: Your anemia is a condition called beta thalassemia minor.  This means that you have a genetic mutation (inherited from your parents) that causes your blood cells to be smaller than normal and for you to have too few blood cells. Your anemia is NOT from iron deficiency. Do NOT restart any iron supplement without checking with our office first. 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  We will continue to watch your iron levels closely.  LOW PLATELETS: Your low platelets may be related to your fatty liver disease and mildly enlarged spleen.  You may also have some immune system dysfunction that causes your body to attack its own platelets.  LOW VITAMIN B-12: Continue to take vitamin B12 supplement 1,000 mcg daily.   FOLLOW-UP APPOINTMENT: 1 year  ** Thank you for trusting me with your healthcare!  I strive to provide all of my patients with quality care at each visit.  If you receive a survey for this visit, I would be so grateful to you for taking the time to provide feedback.  Thank you in advance!  ~ Ramisa Duman                   Dr. Alean Stands   &   Pleasant Barefoot, PA-C   - - - - - - - - - - - - - - - - - -    Thank you for choosing [facility] at [facility] to provide your oncology and hematology care.  To afford each patient quality time with our provider, please arrive at least 15 minutes before your scheduled appointment time.   If you have a lab appointment with the Cancer Center please come in thru the Main Entrance and check in at the main information desk.  You need to re-schedule your appointment should you arrive 10 or more minutes late.  We strive to give  you quality time with our providers, and arriving late affects you and other patients whose appointments are after yours.  Also, if you no show three or more times for appointments you may be dismissed from the clinic at the providers discretion.     Again, thank you for choosing [facility].  Our hope is that these requests will decrease the amount of time that you wait before being seen by our physicians.       _____________________________________________________________  Should you have questions after your visit to [facility], please contact our office at [phone] and follow the prompts.  Our office hours are 8:00 a.m. and 4:30 p.m. Monday - Friday.  Please note that voicemails left after 4:00 p.m. may not be returned until the following business day.  We are closed weekends and major holidays.  You do have access to a nurse 24-7, just call the main number to the clinic [phone] and do not press any options, hold on the line and a nurse will answer the phone.    For prescription refill requests, have your pharmacy contact our office and allow 72 hours.

## 2024-06-17 ENCOUNTER — Telehealth: Payer: Self-pay | Admitting: Gastroenterology

## 2024-06-17 NOTE — Telephone Encounter (Signed)
 Charmaine, can you please reach group home to see if patient had labs completed that were ordered at his last office visit?  I had ordered a CBC and fractionated bilirubin.  I have not received any results.

## 2024-06-18 DIAGNOSIS — R17 Unspecified jaundice: Secondary | ICD-10-CM | POA: Diagnosis not present

## 2024-06-18 DIAGNOSIS — K760 Fatty (change of) liver, not elsewhere classified: Secondary | ICD-10-CM | POA: Diagnosis not present

## 2024-06-18 NOTE — Telephone Encounter (Signed)
 Spoke to Facilities manager at [facility] group home [phone]. She stated that they would take pt today to have labs done.

## 2024-06-19 ENCOUNTER — Ambulatory Visit: Payer: Self-pay | Admitting: Gastroenterology

## 2024-06-19 LAB — CBC
Hematocrit: 36.2 % — ABNORMAL LOW (ref 37.5–51.0)
Hemoglobin: 10.2 g/dL — ABNORMAL LOW (ref 13.0–17.7)
MCH: 20.7 pg — ABNORMAL LOW (ref 26.6–33.0)
MCHC: 28.2 g/dL — ABNORMAL LOW (ref 31.5–35.7)
MCV: 74 fL — ABNORMAL LOW (ref 79–97)
Platelets: 142 x10E3/uL — ABNORMAL LOW (ref 150–450)
RBC: 4.92 x10E6/uL (ref 4.14–5.80)
RDW: 20.1 % — ABNORMAL HIGH (ref 11.6–15.4)
WBC: 7 x10E3/uL (ref 3.4–10.8)

## 2024-06-19 LAB — BILIRUBIN, FRACTIONATED(TOT/DIR/INDIR)
Bilirubin Total: 1 mg/dL (ref 0.0–1.2)
Bilirubin, Direct: 0.33 mg/dL (ref 0.00–0.40)
Bilirubin, Indirect: 0.67 mg/dL (ref 0.10–0.80)

## 2024-06-20 NOTE — Telephone Encounter (Signed)
 Labs have been completed. Can be seen under lab results.

## 2024-06-30 DIAGNOSIS — K760 Fatty (change of) liver, not elsewhere classified: Secondary | ICD-10-CM | POA: Diagnosis not present

## 2024-07-03 ENCOUNTER — Telehealth: Payer: Self-pay | Admitting: Gastroenterology

## 2024-07-03 NOTE — Telephone Encounter (Signed)
 Received FibroScan results from [facility].  Full report is scanned under media.  Patient had F0-F1 fibrosis, S2 hepatic steatosis.  F0-F1 means very mild hepatic fibrosis. Treatment of this is healthy diet and regular exercise as per below.   Instructions for fatty liver: Low fat/cholesterol diet.   Avoid sweets, sodas, fruit juices, sweetened beverages like tea, etc. Gradually increase exercise from 15 min daily up to 1 hr per day 5 days/week.

## 2024-07-08 NOTE — Telephone Encounter (Signed)
 Legal guardian was made aware and verbalized understanding.

## 2024-07-08 NOTE — Telephone Encounter (Signed)
 Lmom for pt to return call

## 2024-07-09 NOTE — Telephone Encounter (Signed)
 noted

## 2024-09-17 ENCOUNTER — Ambulatory Visit (INDEPENDENT_AMBULATORY_CARE_PROVIDER_SITE_OTHER): Admitting: Neurology

## 2024-09-17 ENCOUNTER — Encounter: Payer: Self-pay | Admitting: Neurology

## 2024-09-17 VITALS — BP 102/69 | HR 92 | Ht 67.0 in | Wt 150.0 lb

## 2024-09-17 DIAGNOSIS — Z87898 Personal history of other specified conditions: Secondary | ICD-10-CM

## 2024-09-17 DIAGNOSIS — R4189 Other symptoms and signs involving cognitive functions and awareness: Secondary | ICD-10-CM | POA: Diagnosis not present

## 2024-09-17 NOTE — Patient Instructions (Signed)
 History of seizures, seizure free for the past 17 years. No further work up indicated.  Continue current medications  Return as needed

## 2024-09-17 NOTE — Progress Notes (Signed)
 [facility]  PATIENT: Cole Stewart DOB: [birth year]  REQUESTING CLINICIAN: Bucio, Silvio BROCKS, FNP HISTORY FROM: Caregiver  REASON FOR VISIT: History of seizure    HISTORICAL  CHIEF COMPLAINT:  Chief Complaint  Patient presents with   New Patient (Initial Visit)    Pt in room 13. Angela from group home. Paper referral for hx of seizures, on medication. seizure free x 15 years.    HISTORY OF PRESENT ILLNESS:  This is a 48 year old gentleman past medical history of cognitive impairment, nonverbal, history of seizures, last seizure more than 17 years ago who was referred by PCP due to history of seizures.  History is mainly obtained from caregiver who reports last seizure more than 17 years ago, described as staring spells.  They presented to the ED and diagnosed with seizure at that time.  Patient is currently on Depakote but he is taking it for mood, currently taking 500 mg at bedtime.  He is also on clonazepam for sleep.  Caregiver does not have any current concern at the moment.  OTHER MEDICAL CONDITIONS: History of seizure, cognitive impairment,   REVIEW OF SYSTEMS: Full 14 system review of systems performed and negative with exception of: Unable to fully obtain   ALLERGIES: No Known Allergies  HOME MEDICATIONS: Outpatient Medications Prior to Visit  Medication Sig Dispense Refill   chlorhexidine  (PERIDEX ) 0.12 % solution Use as directed 15 mLs in the mouth or throat 2 (two) times daily.      clonazePAM (KLONOPIN) 0.5 MG tablet Take 0.25-0.5 mg by mouth See admin instructions. Take 0.25 mg by mouth in the morning and take 0.5 mg at noon and at bedtime     dicyclomine  (BENTYL ) 10 MG capsule TAKE 1 CAPSULE UP TO 3 TIMES DAILY DAILY AS NEEDED FOR ABDOMINAL PAIN OR DIARRHEA. HOLD IN THE SETTING OF CONSTIPATION. 30 capsule 3   divalproex (DEPAKOTE) 250 MG DR tablet Take 250 mg by mouth at bedtime. Take 2 tablet by mouth once daily at bedtime     famotidine   (PEPCID ) 20 MG tablet TAKE 1 TABLET BY MOUTH AT BEDTIME. 30 tablet 11   gabapentin (NEURONTIN) 400 MG capsule Take 400-1,200 mg by mouth See admin instructions. Take 400 mg by mouth in the morning and take 1200 mg by mouth at bedtime     iloperidone (FANAPT) 4 MG TABS tablet Take 4 mg by mouth 2 (two) times daily.     Omega-3 Fatty Acids (FISH OIL OMEGA-3) 1000 MG CAPS Take 2,000 mg by mouth daily.     divalproex (DEPAKOTE) 250 MG DR tablet Take 250-500 mg by mouth See admin instructions. Take 250 mg by mouth at 0700 and take 250 mg by mouth at 1200. Take 500 mg by mouth at bedtime 2000.     cyanocobalamin  (VITAMIN B12) 1000 MCG tablet TAKE 1 TABLET BY MOUTH ONCE DAILY. (Patient not taking: Reported on 09/17/2024) 30 tablet 11   dexlansoprazole  (DEXILANT ) 60 MG capsule TAKE 1 CAPSULE BY MOUTH ONCE A DAY. (Patient not taking: Reported on 09/17/2024) 30 capsule 2   FLUBLOK 0.5 ML SOSY  (Patient not taking: Reported on 09/17/2024)     hydrocortisone cream 1 % Apply topically. (Patient not taking: Reported on 09/17/2024)     ketoconazole (NIZORAL) 2 % shampoo Apply 1 application topically 2 (two) times a week. *Lather, leave for 5 minutes, then rinse* (Patient not taking: Reported on 09/17/2024)     lisinopril (ZESTRIL) 20 MG tablet Take 20 mg by mouth  daily. (Patient not taking: Reported on 09/17/2024)     Olopatadine HCl 0.2 % SOLN Place 1 drop into both eyes daily.  (Patient not taking: Reported on 09/17/2024)     QUEtiapine (SEROQUEL XR) 200 MG 24 hr tablet Take 200 mg by mouth at bedtime. (Patient not taking: Reported on 09/17/2024)     SPIKEVAX syringe  (Patient not taking: Reported on 09/17/2024)     Vitamin D, Ergocalciferol, (DRISDOL) 1.25 MG (50000 UT) CAPS capsule Take 50,000 Units by mouth every 30 (thirty) days. (Patient not taking: Reported on 09/17/2024)     vitamin E 200 UNIT capsule Take 200 Units by mouth every morning. (Patient not taking: Reported on 09/17/2024)     No  facility-administered medications prior to visit.    PAST MEDICAL HISTORY: Past Medical History:  Diagnosis Date   Anemia    GERD (gastroesophageal reflux disease)    Mental retardation    Mood disorder    MVP (mitral valve prolapse)    Recurrent abdominal pain    Seizures (HCC)    last seizure was 2010.Unknown etiology   Thrombocytopenia     PAST SURGICAL HISTORY: Past Surgical History:  Procedure Laterality Date   CHOLECYSTECTOMY     COLONOSCOPY N/A 02/28/2024   Procedure: COLONOSCOPY;  Surgeon: Shaaron Lamar HERO, MD;  Location: [facility];  Service: Endoscopy;  Laterality: N/A;  8:15 am, ok for room 1/2   COLONOSCOPY WITH PROPOFOL  N/A 12/05/2018   PROPOFOL ;  Surgeon: Shaaron Lamar HERO, MD;  One 5 mm polyp in the cecum.  Tubular adenoma.  Due for repeat in 2025.   ESOPHAGOGASTRODUODENOSCOPY (EGD) WITH PROPOFOL  N/A 07/10/2019   PROPOFOL ;  Surgeon: Shaaron Lamar HERO, MD; normal exam   POLYPECTOMY  12/05/2018   Procedure: POLYPECTOMY;  Surgeon: Shaaron Lamar HERO, MD;  Location: [facility];  Service: Endoscopy;;  colon    FAMILY HISTORY: Family History  Family history unknown: Yes    SOCIAL HISTORY: Social History   Socioeconomic History   Marital status: Single    Spouse name: Not on file   Number of children: 0   Years of education: Not on file   Highest education level: Not on file  Occupational History   Not on file  Tobacco Use   Smoking status: Never   Smokeless tobacco: Never  Vaping Use   Vaping status: Never Used  Substance and Sexual Activity   Alcohol  use: No   Drug use: No   Sexual activity: Never  Other Topics Concern   Not on file  Social History Narrative   Not on file   Social Drivers of Health   Financial Resource Strain: Not on file  Food Insecurity: Not on file  Transportation Needs: Not on file  Physical Activity: Not on file  Stress: Not on file  Social Connections: Not on file  Intimate Partner Violence: Not on file    PHYSICAL  EXAM  GENERAL EXAM/CONSTITUTIONAL: Vitals:  Vitals:   09/17/24 0921  BP: 102/69  Pulse: 92  Weight: 150 lb (68 kg)  Height: 5' 7 (1.702 m)   Body mass index is 23.49 kg/m. Wt Readings from Last 3 Encounters:  09/17/24 150 lb (68 kg)  06/04/24 148 lb 5.9 oz (67.3 kg)  06/02/24 146 lb 9.6 oz (66.5 kg)   Patient is in no distress; well developed, nourished and groomed; neck is supple  MUSCULOSKELETAL: Gait, strength, tone, movements noted in Neurologic exam below  NEUROLOGIC: MENTAL STATUS:  No data to display         awake, alert, Nonverbal Able to mimic examiner normal bulk and tone, full strength in the BUE, BLE Gait is normal    DIAGNOSTIC DATA (LABS, IMAGING, TESTING) - I reviewed patient records, labs, notes, testing and imaging myself where available.  Lab Results  Component Value Date   WBC 7.0 06/18/2024   HGB 10.2 (L) 06/18/2024   HCT 36.2 (L) 06/18/2024   MCV 74 (L) 06/18/2024   PLT 142 (L) 06/18/2024      Component Value Date/Time   NA 139 05/26/2024 0842   K 3.4 (L) 05/26/2024 0842   CL 103 05/26/2024 0842   CO2 27 05/26/2024 0842   GLUCOSE 74 05/26/2024 0842   BUN 15 05/26/2024 0842   CREATININE 0.65 05/26/2024 0842   CREATININE 0.70 10/23/2018 0916   CALCIUM 9.0 05/26/2024 0842   PROT 7.7 05/26/2024 0842   ALBUMIN 4.0 05/26/2024 0842   AST 18 05/26/2024 0842   ALT 22 05/26/2024 0842   ALKPHOS 64 05/26/2024 0842   BILITOT 1.0 06/18/2024 1219   GFRNONAA >60 05/26/2024 0842   GFRAA >60 04/15/2020 1110   No results found for: CHOL, HDL, LDLCALC, LDLDIRECT, TRIG, CHOLHDL Lab Results  Component Value Date   HGBA1C 4.7 03/26/2013   Lab Results  Component Value Date   VITAMINB12 823 05/26/2024   Lab Results  Component Value Date   TSH 7.026 (H) 03/26/2013     ASSESSMENT AND PLAN  48 y.o. year old male with developmental delay, cognitive impairment, history of seizures, last seizure more than 17 years who was  referred for his history of seizures.  At the moment no further workup needed, patient will continue on Depakote for mood and will continue to follow with PCP.  Return as needed   1. Cognitive impairment   2. History of seizure      Patient Instructions  History of seizures, seizure free for the past 17 years. No further work up indicated.  Continue current medications  Return as needed   No orders of the defined types were placed in this encounter.   No orders of the defined types were placed in this encounter.   Return if symptoms worsen or fail to improve.    Pastor Falling, MD 09/17/2024, 9:57 AM  [facility] [address], [address] [place], [place] [address] [phone]

## 2024-10-07 ENCOUNTER — Encounter: Payer: Self-pay | Admitting: Gastroenterology

## 2025-05-27 ENCOUNTER — Other Ambulatory Visit

## 2025-06-03 ENCOUNTER — Ambulatory Visit: Admitting: Physician Assistant
